# Patient Record
Sex: Male | Born: 1950
Health system: Southern US, Community
[De-identification: ages and names within clinical notes are randomized; demographics above are authoritative.]

## PROBLEM LIST (undated history)

## (undated) DIAGNOSIS — C3491 Malignant neoplasm of unspecified part of right bronchus or lung: Secondary | ICD-10-CM

## (undated) DIAGNOSIS — Z923 Personal history of irradiation: Secondary | ICD-10-CM

## (undated) DIAGNOSIS — I10 Essential (primary) hypertension: Secondary | ICD-10-CM

## (undated) DIAGNOSIS — Z7189 Other specified counseling: Secondary | ICD-10-CM

## (undated) DIAGNOSIS — J9690 Respiratory failure, unspecified, unspecified whether with hypoxia or hypercapnia: Secondary | ICD-10-CM

## (undated) HISTORY — PX: OTHER SURGICAL HISTORY: SHX169

## (undated) HISTORY — DX: Malignant neoplasm of unspecified part of right bronchus or lung: C34.91

## (undated) HISTORY — DX: Other specified counseling: Z71.89

## (undated) HISTORY — PX: EYE SURGERY: SHX253

---

## 1998-07-20 ENCOUNTER — Emergency Department (HOSPITAL_COMMUNITY): Admission: EM | Admit: 1998-07-20 | Discharge: 1998-07-20 | Payer: Self-pay | Admitting: Emergency Medicine

## 1998-07-20 ENCOUNTER — Encounter: Payer: Self-pay | Admitting: Emergency Medicine

## 1998-08-07 ENCOUNTER — Emergency Department (HOSPITAL_COMMUNITY): Admission: EM | Admit: 1998-08-07 | Discharge: 1998-08-07 | Payer: Self-pay | Admitting: Emergency Medicine

## 2000-12-14 ENCOUNTER — Ambulatory Visit (HOSPITAL_COMMUNITY): Admission: RE | Admit: 2000-12-14 | Discharge: 2000-12-14 | Payer: Self-pay | Admitting: Orthopedic Surgery

## 2000-12-14 ENCOUNTER — Encounter: Payer: Self-pay | Admitting: Orthopedic Surgery

## 2001-07-18 ENCOUNTER — Encounter: Payer: Self-pay | Admitting: Orthopedic Surgery

## 2001-07-23 ENCOUNTER — Inpatient Hospital Stay (HOSPITAL_COMMUNITY): Admission: RE | Admit: 2001-07-23 | Discharge: 2001-07-26 | Payer: Self-pay | Admitting: Orthopedic Surgery

## 2001-07-23 ENCOUNTER — Encounter: Payer: Self-pay | Admitting: Orthopedic Surgery

## 2005-06-02 ENCOUNTER — Emergency Department (HOSPITAL_COMMUNITY): Admission: EM | Admit: 2005-06-02 | Discharge: 2005-06-02 | Payer: Self-pay | Admitting: Emergency Medicine

## 2007-08-10 ENCOUNTER — Emergency Department (HOSPITAL_COMMUNITY): Admission: EM | Admit: 2007-08-10 | Discharge: 2007-08-10 | Payer: Self-pay | Admitting: Emergency Medicine

## 2010-06-07 ENCOUNTER — Emergency Department (HOSPITAL_COMMUNITY)
Admission: EM | Admit: 2010-06-07 | Discharge: 2010-06-07 | Payer: Self-pay | Source: Home / Self Care | Admitting: Emergency Medicine

## 2010-06-11 ENCOUNTER — Emergency Department (HOSPITAL_COMMUNITY)
Admission: EM | Admit: 2010-06-11 | Discharge: 2010-06-11 | Payer: Self-pay | Source: Home / Self Care | Admitting: Emergency Medicine

## 2010-10-08 NOTE — H&P (Signed)
[facility]  Patient:    Bradley Maldonado, Bradley Maldonado Visit Number: [id number] MRN: [id number]          Service Type: Attending:  Ollen Gross, M.D. Dictated by:   Sammuel Cooper. Mahar, P.A. Adm. Date:  07/23/01                           History and Physical  DATE OF BIRTH:  1950/07/11  CHIEF COMPLAINT:  Right hip pain.  HISTORY OF PRESENT ILLNESS:  The patient is a 60 year old male with right hip pain times approximately one year.  It has gotten progressively worse over the last few months, and he has documented avascular necrosis to the right hip. It has gotten to the point that it is interfering with his activities of daily living and quality of life.  Risks and benefits of the above surgery were discussed with the patient by Dr. Ollen Gross, indicated understanding, and wished to proceed.  ALLERGIES:  No known drug allergies.  CURRENT MEDICATIONS:  Voltaren.  PAST MEDICAL HISTORY:  Healthy.  PAST SURGICAL HISTORY:  Right eye foreign body removal.  SOCIAL HISTORY:  Smokes 1/2-pack of cigarettes per day.  He drinks approximately six beers per day.  [name] divorced.  [name] currently living with his girlfriend.  He has three grown children.  His girlfriend will be helping him postoperatively, and she will be available 24-hours a day throughout the postoperative course.  FAMILY HISTORY:  Mother deceased at age 69 with a history of diabetes mellitus, hypertension, and coronary artery disease.  Father alive at age 9 with emphysema.  REVIEW OF SYSTEMS:  The patient denies any fevers, chills, night sweats, or bleeding.  CNS:  Denies blurry vision, double vision, headaches, seizure, or paralysis.  Cardiovascular:  Denied chest pain, angina, orthopnea, claudication, or palpitations.  Pulmonary:  Denies any shortness of breath, productive cough, or hemoptysis.  GI:  Denies nausea, vomiting, constipation, diarrhea, melena, or bloody stools.  GU:  Denies  dysuria, hematuria, or discharge.  Musculoskeletal:  Denies any paresthesias, numbness, or paralysis to extremities.  PHYSICAL EXAMINATION:  VITAL SIGNS:  Blood pressure was 134/88, respirations 16 and unlabored, pulses 80 and regular.  GENERAL:  The patient is a 60 year old black male who is alert and oriented in no acute distress.  Well-nourished and well-groomed.  Appears stated age and is very pleasant and cooperative to exam.  HEENT:  Head is normocephalic and atraumatic.  Pupils equal, round and reactive to light.  Extraocular movements are intact.  Nares patent bilaterally.  Oropharynx is clear without any erythema or exudate.  NECK:  Soft to palpation.  No bruits appreciated.  No lymphadenopathy or thyromegaly noted.  CHEST:  Clear to auscultation bilaterally.  No rales, rhonchi, stridor, wheeze.  Breasts not pertinent and not performed.  CARDIOVASCULAR:  S1 and S2.  Regular rate and rhythm.  No murmurs, gallops, or rubs noted.  ABDOMEN:  Positive bowel sounds throughout, nontender, and nondistended.  No organomegaly noted.  Soft to palpation.  GENITOURINARY:  Not pertinent and not performed.  EXTREMITIES:  Limited range of motion to the right hip secondary to pain.  He walks with an antalgic gait.  Sensation is grossly intact to light touch throughout bilateral lower extremities.  Posterior tibialis pulses are intact and equal bilaterally.  Distal motor function is intact.  Right hip flexion to approximately 90 degrees, rotation internally to approximately 20 degrees, externally 30 degrees, abduction approximately  30 degrees.  SKIN:  Intact without any lesions or rashes.  DIAGNOSTIC STUDIES:  X-rays show progressive collapse of femoral head on the films from May 11, 2001 as compared to films from two months prior.  IMPRESSION:  Right hip avascular necrosis.  PLAN: 1. Admit to [facility] on July 23, 2001 for a right total hip    arthroplasty which  will be done by Ollen Gross, M.D. 2. The patient has no primary care practitioner.  He does visit [facility] on    [address]. Dictated by:   Sammuel Cooper. Mahar, P.A. Attending:  Ollen Gross, M.D. DD:  07/18/01 TD:  07/18/01 Job: [id number]

## 2010-10-08 NOTE — Discharge Summary (Signed)
[facility]  Patient:    Bradley Maldonado, Bradley Maldonado Visit Number: [id number] MRN: [id number]          Service Type: SUR Location: 4W 0477 01 Attending Physician:  Loanne Drilling Dictated by:   Alexzandrew L. Perkins, P.A.-C. Admit Date:  07/23/2001 Discharge Date: 07/26/2001                             Discharge Summary  5ADMITTING DIAGNOSES: 1. Right hip avascular necrosis. 2. Smoking history. 3. Alcohol use.  DISCHARGE DIAGNOSES: 1. Avascular necrosis, right hip, status post right total hip replacement    arthroplasty. 2. Mild postoperative hemorrhagic anemia, asymptomatic. 3. Postoperative hyponatremia, improved. 4. Postoperative hypokalemia, improved. 5. Smoking history. 6. Alcohol use. 7. Mildly elevated liver enzymes.  PROCEDURE:  The patient was taken to the OR on July 23, 2001, and underwent a right total hip replacement arthroplasty.  Surgeon, Ollen Gross, M.D., assistant Ottie Glazier. Wynona Neat, P.A.-C.  The surgery was done under general anesthesia.  A Hemovac drain x1 placed at the time of surgery.  CONSULTATIONS:  None.  HISTORY OF PRESENT ILLNESS:  The patient is a 60 year old male with right hip pain for approximately one year now.  The pain has been progressively getting worse over the last few months.  He has been documented with avascular necrosis of the right hip.  Pain has started to interfere with his daily activities and quality of life.  He was seen and evaluated by Dr. Ollen Gross.  Risks and benefits of the procedure for hip replacement have been discussed with the patient, and he has elected to proceed with surgery.  LABORATORY DATA:  CBC on admission had a hemoglobin of 15.0, hematocrit 48.1, white cell count 7.6, red cell count 4.95.  Differential within normal limits. Follow-up H&H after surgery was 3.1 and 38.2, last noted H&H as 12.0 and 30.0. PT, PTT on admission were 12.5 and 27, respectively, with an INR of  0.9. Serial protimes followed per Coumadin protocol, last noted PT, INR 17.8 and 1.7.  Chemistry panel on admission:  Elevated AST and elevated ALT of 45 and 43, respectively.  Remaining chemistry panel all within normal limits. Follow-up BMET showed a drop in sodium from 136 to 131.  It was back up prior to discharge to 134.  The patient did have a drop in potassium from 4.2 to 3.4 and was given potassium supplements.  Glucose elevated from 100 up to 139, was back down to 127.  Urinalysis on admission was negative.  Blood group type is A positive.  The last noted sodium and potassium levels:  Sodium was back up to 133.  The last potassium level had come up from 3.4 to 3.6 prior to his discharge.  EKG dated July 18, 2001:  Normal sinus rhythm, normal EKG, no old tracings to compare with, confirmed by Luis Abed, M.D.  Chest x-ray dated July 18, 2001:  Normal chest.  Hip films July 18, 2001, showed avascular necrosis of the right femoral head with secondary severe degenerative changes of the hip.  Postoperative hip films on July 23, 2001: Total hip replacement on the right felt to be in good position and alignment.  HOSPITAL COURSE:  The patient was admitted to [facility], taken to the OR, and underwent the above-stated procedure without complications. The patient tolerated the procedure well, was later transferred to the recovery room and then to the orthopedic floor for  continued postoperative care.  Vital signs were followed and may be found in the graphics section of this chart. The patient had had some postoperative temperature after surgery.  The temperature did increase up to 102.4, noted on postop day 2.  Temperature did come back down to 98.2.  It was again elevated up to 101.1 the evening of postop day 2 but had come back down to 100.9 prior to his release.  He did have a cyclic-type temperature which did respond to antipyretics and also incentive  spirometer.  The patient was placed on PCA analgesia for pain control following surgery.  We also placed him on Coumadin protocol for DVT prophylaxis.  He was given Librium due to his alcohol use.  The Hemovac drain which was placed at the time of surgery was pulled on postop day 1. The patient was noted to have a drop in sodium.  Fluids were adjusted, and he was encouraged mobility by physical therapy.  On postop day 2 is when he had the highest elevated temperature and again did respond to antipyretics and incentive spirometer.  He was noted again to have some mild hyponatremia and also some hypokalemia.  He was placed on potassium supplements.  His hyponatremia improved along with his hypokalemia.  He did progress with physical therapy, which was ordered postoperatively.  He was ambulating greater than 80 feet by postop day 2 and was getting around quite well.  By postop day 3 he was seen on rounds, continued to have some cyclic temperature; however, he had been up ambulating and was responding and his temperature was back down to 100.9.  He was feeling well.  His sodium had improved, his potassium had improved, and it was decided the patient would be discharged home.  DISCHARGE PLAN:  The patient was discharged home on July 26, 2001.  DISCHARGE DIAGNOSES:  Please see above.  DISCHARGE MEDICATIONS:  He was given Colace, Coumadin, Percocet for pain, and also Robaxin.  DIET:  As tolerated.  FOLLOW-UP:  Two weeks from surgery.  Call the office for an appointment at [phone].  ACTIVITY:  Hip precautions at all times.  Touchdown weightbearing to the right lower extremity.   Home health PT and home health nursing.  DISPOSITION:  Home.  CONDITION ON DISCHARGE:  Improved. Dictated by:   Alexzandrew L. Perkins, P.A.-C. Attending Physician:  Loanne Drilling DD:  08/15/01 TD:  08/16/01 Job: [id number]

## 2010-10-08 NOTE — Op Note (Signed)
[facility]  Patient:    Bradley Maldonado, Bradley Maldonado Visit Number: [id number] MRN: [id number]          Service Type: SUR Location: 1S X008 01 Attending Physician:  Loanne Drilling Dictated by:   Ollen Gross, M.D. Proc. Date: 07/23/01 Admit Date:  07/23/2001                             Operative Report  PREOPERATIVE DIAGNOSIS:  Avascular necrosis, right hip.  POSTOPERATIVE DIAGNOSIS:  Avascular necrosis, right hip.  PROCEDURE:  Right total hip arthroplasty.  SURGEON:  Ollen Gross, M.D.  ASSISTANT:  Ottie Glazier. Wynona Neat, P.A.-C.  ANESTHESIA:  General.  ESTIMATED BLOOD LOSS:  600.  DRAIN:  Hemovac x 1.  COMPLICATIONS:  None.  CONDITION:  Stable to recovery.  BRIEF CLINICAL NOTE:  Bradley Maldonado is a 60 year old male with stage 4 avascular necrosis of his right hip with pain refractory to nonoperative management.  He presents now for right total hip arthroplasty.  PROCEDURE IN DETAIL:  After the successful administered of general anesthetic, the patient is placed in the left lateral decubitus position with the right side up and held with the hip positioner.  The right lower extremity was isolated from its perineum with plastic drapes and prepped and draped in the usual sterile fashion.  A standard posterolateral incision was made with 10 blade through subcutaneous tissues to the level of the fascia lata which was incised in line with the skin incision.  The sciatic nerve was palpated and protected and the short external rotators isolated off the femur.  A capsulectomy was performed.  Hip was dislocated.  He had severe collapse of his femoral head with cartilage peeling off the bone.  The center of the head was marked, and trial prosthesis is placed such that the center of the trial head corresponds with the center of his native femoral head.  Osteotomy is made with an oscillating saw.  The femoral head is removed and then the femur retracted anteriorly.   Anterior acetabular exposure is obtained.  Acetabular reaming is initiated with a size 4y, coursing increments of 2 to a 53, and then a 54 mm Pinnacle acetabular shell is placed into anatomic position.  It has a great fit and is transfixed with two domed screws.  Trial 32 mm neutral liner is placed.  The femur is prepared first with the canal finder and then irrigation.  Axial reaming is performed to 13.5 mm.  Proximal reaming is performed up to an 18-F and the sleeve machine to a large.  An 18-F large sleeve is placed with an 18 x 13 stem and a 36 plus 8 neck with a 32 plus 0 head.  Trial is performed matching native anteversion.  Reduction shows excellent stability with full extension, external rotation, 70 degrees flexion, 40 degrees adduction, 90 degrees internal rotation and 90 degrees of flexion, 70 degrees internal rotation.  Trials were then removed and the apex hole eliminator placed into the acetabular shell.  Permanent 32 mm neutral Marathon liner is placed into the shell.  The 18-F large sleeve is placed in the proximal femur, then the 18 x 13 stem with 36 plus 8 neck is placed, matching his native anteversion. The 32 plus 0 head is then placed, hip reduced with great stability.  The wound is copiously irrigated with antibiotic solution.  The short external rotators are reattached to the femur through drill holes.  The  fascia lata is closed over a Hemovac drain with interrupted #1 Vicryl.  Subcu closed with #1 and 2-0 Vicryl, subcuticular running 4-0 Monocryl.  Incision is cleaned and dried and Steri-Strips and a bulky sterile dressing applied.  The patient is then awakened and transported to recovery in stable condition. Dictated by:   Ollen Gross, M.D. Attending Physician:  Loanne Drilling DD:  07/23/01 TD:  07/23/01 Job: [id number]

## 2010-11-25 ENCOUNTER — Emergency Department (HOSPITAL_COMMUNITY)
Admission: EM | Admit: 2010-11-25 | Discharge: 2010-11-26 | Disposition: A | Discharge status: Home / Self Care | Payer: Medicare Other | Attending: Emergency Medicine | Admitting: Emergency Medicine

## 2010-11-25 ENCOUNTER — Emergency Department (HOSPITAL_COMMUNITY): Payer: Medicare Other

## 2010-11-25 DIAGNOSIS — M7989 Other specified soft tissue disorders: Secondary | ICD-10-CM | POA: Insufficient documentation

## 2010-11-25 DIAGNOSIS — L02619 Cutaneous abscess of unspecified foot: Secondary | ICD-10-CM | POA: Insufficient documentation

## 2010-11-25 DIAGNOSIS — L539 Erythematous condition, unspecified: Secondary | ICD-10-CM | POA: Insufficient documentation

## 2010-11-25 LAB — BASIC METABOLIC PANEL
Chloride: 104 mEq/L (ref 96–112)
Creatinine, Ser: 0.72 mg/dL (ref 0.50–1.35)
GFR calc Af Amer: >60 mL/min (ref >60)
Potassium: 4.3 mEq/L (ref 3.5–5.1)
Sodium: 140 mEq/L (ref 135–145)

## 2010-11-25 LAB — DIFFERENTIAL
Basophils Absolute: 0 10*3/uL (ref 0.0–0.1)
Eosinophils Absolute: 0.1 10*3/uL (ref 0.0–0.7)
Eosinophils Relative: 2 % (ref 0–5)
Neutrophils Relative %: 45 % (ref 43–77)

## 2010-11-25 LAB — CBC
Platelets: 229 10*3/uL (ref 150–400)
RBC: 4.79 MIL/uL (ref 4.22–5.81)
RDW: 14.3 % (ref 11.5–15.5)
WBC: 7.2 10*3/uL (ref 4.0–10.5)

## 2016-12-17 ENCOUNTER — Emergency Department (HOSPITAL_COMMUNITY): Payer: Medicare Other

## 2016-12-17 ENCOUNTER — Inpatient Hospital Stay (HOSPITAL_COMMUNITY): Payer: Medicare Other

## 2016-12-17 ENCOUNTER — Encounter (HOSPITAL_COMMUNITY): Payer: Self-pay

## 2016-12-17 ENCOUNTER — Inpatient Hospital Stay (HOSPITAL_COMMUNITY)
Admission: EM | Admit: 2016-12-17 | Discharge: 2016-12-21 | DRG: 180 | Disposition: A | Discharge status: Home / Self Care | Payer: Medicare Other | Attending: Internal Medicine | Admitting: Internal Medicine

## 2016-12-17 DIAGNOSIS — C3491 Malignant neoplasm of unspecified part of right bronchus or lung: Secondary | ICD-10-CM | POA: Diagnosis not present

## 2016-12-17 DIAGNOSIS — I10 Essential (primary) hypertension: Secondary | ICD-10-CM | POA: Diagnosis not present

## 2016-12-17 DIAGNOSIS — J910 Malignant pleural effusion: Secondary | ICD-10-CM | POA: Diagnosis not present

## 2016-12-17 DIAGNOSIS — J440 Chronic obstructive pulmonary disease with acute lower respiratory infection: Secondary | ICD-10-CM | POA: Diagnosis not present

## 2016-12-17 DIAGNOSIS — C3411 Malignant neoplasm of upper lobe, right bronchus or lung: Secondary | ICD-10-CM | POA: Diagnosis not present

## 2016-12-17 DIAGNOSIS — F419 Anxiety disorder, unspecified: Secondary | ICD-10-CM | POA: Diagnosis present

## 2016-12-17 DIAGNOSIS — R451 Restlessness and agitation: Secondary | ICD-10-CM | POA: Diagnosis not present

## 2016-12-17 DIAGNOSIS — J449 Chronic obstructive pulmonary disease, unspecified: Secondary | ICD-10-CM

## 2016-12-17 DIAGNOSIS — R918 Other nonspecific abnormal finding of lung field: Secondary | ICD-10-CM | POA: Diagnosis not present

## 2016-12-17 DIAGNOSIS — F1010 Alcohol abuse, uncomplicated: Secondary | ICD-10-CM | POA: Diagnosis present

## 2016-12-17 DIAGNOSIS — C384 Malignant neoplasm of pleura: Secondary | ICD-10-CM | POA: Diagnosis not present

## 2016-12-17 DIAGNOSIS — J439 Emphysema, unspecified: Secondary | ICD-10-CM | POA: Diagnosis present

## 2016-12-17 DIAGNOSIS — Z9889 Other specified postprocedural states: Secondary | ICD-10-CM

## 2016-12-17 DIAGNOSIS — J9811 Atelectasis: Secondary | ICD-10-CM | POA: Diagnosis not present

## 2016-12-17 DIAGNOSIS — H02402 Unspecified ptosis of left eyelid: Secondary | ICD-10-CM | POA: Diagnosis present

## 2016-12-17 DIAGNOSIS — F17210 Nicotine dependence, cigarettes, uncomplicated: Secondary | ICD-10-CM | POA: Diagnosis present

## 2016-12-17 DIAGNOSIS — R739 Hyperglycemia, unspecified: Secondary | ICD-10-CM | POA: Diagnosis not present

## 2016-12-17 DIAGNOSIS — J189 Pneumonia, unspecified organism: Secondary | ICD-10-CM | POA: Diagnosis present

## 2016-12-17 DIAGNOSIS — R911 Solitary pulmonary nodule: Secondary | ICD-10-CM | POA: Diagnosis not present

## 2016-12-17 DIAGNOSIS — R0602 Shortness of breath: Secondary | ICD-10-CM | POA: Diagnosis not present

## 2016-12-17 DIAGNOSIS — Z9981 Dependence on supplemental oxygen: Secondary | ICD-10-CM | POA: Diagnosis not present

## 2016-12-17 DIAGNOSIS — J90 Pleural effusion, not elsewhere classified: Secondary | ICD-10-CM

## 2016-12-17 DIAGNOSIS — C3490 Malignant neoplasm of unspecified part of unspecified bronchus or lung: Principal | ICD-10-CM | POA: Diagnosis present

## 2016-12-17 DIAGNOSIS — Z96641 Presence of right artificial hip joint: Secondary | ICD-10-CM | POA: Diagnosis not present

## 2016-12-17 DIAGNOSIS — R29818 Other symptoms and signs involving the nervous system: Secondary | ICD-10-CM

## 2016-12-17 DIAGNOSIS — Z833 Family history of diabetes mellitus: Secondary | ICD-10-CM

## 2016-12-17 DIAGNOSIS — Z801 Family history of malignant neoplasm of trachea, bronchus and lung: Secondary | ICD-10-CM | POA: Diagnosis not present

## 2016-12-17 DIAGNOSIS — J9601 Acute respiratory failure with hypoxia: Secondary | ICD-10-CM | POA: Diagnosis not present

## 2016-12-17 DIAGNOSIS — F17200 Nicotine dependence, unspecified, uncomplicated: Secondary | ICD-10-CM | POA: Diagnosis not present

## 2016-12-17 DIAGNOSIS — R05 Cough: Secondary | ICD-10-CM | POA: Diagnosis not present

## 2016-12-17 LAB — CBC WITH DIFFERENTIAL/PLATELET
BASOS PCT: 0 %
Basophils Absolute: 0 10*3/uL (ref 0.0–0.1)
EOS ABS: 0.1 10*3/uL (ref 0.0–0.7)
Eosinophils Relative: 1 %
HCT: 49.8 % (ref 39.0–52.0)
Hemoglobin: 16.2 g/dL (ref 13.0–17.0)
Lymphocytes Relative: 29 %
Lymphs Abs: 2.4 10*3/uL (ref 0.7–4.0)
MCH: 29.6 pg (ref 26.0–34.0)
MCHC: 32.5 g/dL (ref 30.0–36.0)
MCV: 90.9 fL (ref 78.0–100.0)
MONO ABS: 1 10*3/uL (ref 0.1–1.0)
MONOS PCT: 12 %
Neutro Abs: 4.6 10*3/uL (ref 1.7–7.7)
Neutrophils Relative %: 58 %
Platelets: 185 10*3/uL (ref 150–400)
RBC: 5.48 MIL/uL (ref 4.22–5.81)
RDW: 14.8 % (ref 11.5–15.5)
WBC: 8 10*3/uL (ref 4.0–10.5)

## 2016-12-17 LAB — BASIC METABOLIC PANEL
ANION GAP: 11 (ref 5–15)
BUN: 6 mg/dL (ref 6–20)
CHLORIDE: 103 mmol/L (ref 101–111)
CO2: 23 mmol/L (ref 22–32)
Calcium: 8.8 mg/dL — ABNORMAL LOW (ref 8.9–10.3)
Creatinine, Ser: 0.83 mg/dL (ref 0.61–1.24)
GFR calc Af Amer: >60 mL/min (ref >60)
GFR calc non Af Amer: >60 mL/min (ref >60)
Glucose, Bld: 101 mg/dL — ABNORMAL HIGH (ref 65–99)
Potassium: 4.3 mmol/L (ref 3.5–5.1)
Sodium: 137 mmol/L (ref 135–145)

## 2016-12-17 LAB — LACTATE DEHYDROGENASE, PLEURAL OR PERITONEAL FLUID: LD FL: 194 U/L — AB (ref 3–23)

## 2016-12-17 LAB — I-STAT CHEM 8, ED
BUN: 6 mg/dL (ref 6–20)
Calcium, Ion: 1.03 mmol/L — ABNORMAL LOW (ref 1.15–1.40)
Chloride: 104 mmol/L (ref 101–111)
Creatinine, Ser: 0.7 mg/dL (ref 0.61–1.24)
Glucose, Bld: 116 mg/dL — ABNORMAL HIGH (ref 65–99)
HEMATOCRIT: 51 % (ref 39.0–52.0)
HEMOGLOBIN: 17.3 g/dL — AB (ref 13.0–17.0)
POTASSIUM: 4 mmol/L (ref 3.5–5.1)
SODIUM: 139 mmol/L (ref 135–145)
TCO2: 22 mmol/L (ref 0–100)

## 2016-12-17 LAB — LACTATE DEHYDROGENASE: LDH: 155 U/L (ref 98–192)

## 2016-12-17 LAB — BODY FLUID CELL COUNT WITH DIFFERENTIAL
Eos, Fluid: 1 %
Lymphs, Fluid: 91 %
Monocyte-Macrophage-Serous Fluid: 4 % — ABNORMAL LOW (ref 50–90)
Neutrophil Count, Fluid: 4 % (ref 0–25)
Total Nucleated Cell Count, Fluid: 842 uL (ref 0–1000)

## 2016-12-17 LAB — GLUCOSE, PLEURAL OR PERITONEAL FLUID: Glucose, Fluid: 137 mg/dL

## 2016-12-17 LAB — GRAM STAIN

## 2016-12-17 LAB — PROTEIN, PLEURAL OR PERITONEAL FLUID: Total protein, fluid: 4.6 g/dL

## 2016-12-17 LAB — I-STAT TROPONIN, ED: Troponin i, poc: 0.04 ng/mL (ref 0.00–0.08)

## 2016-12-17 LAB — ALBUMIN, PLEURAL OR PERITONEAL FLUID: Albumin, Fluid: 2.8 g/dL

## 2016-12-17 LAB — BRAIN NATRIURETIC PEPTIDE: B NATRIURETIC PEPTIDE 5: 93.5 pg/mL (ref 0.0–100.0)

## 2016-12-17 MED ORDER — ADULT MULTIVITAMIN W/MINERALS CH
1.0000 | ORAL_TABLET | Freq: Every day | ORAL | Status: DC
  Administered 2016-12-17 – 2016-12-21 (×5): 1 via ORAL
  Filled 2016-12-17 (×5): qty 1

## 2016-12-17 MED ORDER — AMLODIPINE BESYLATE 5 MG PO TABS
5.0000 mg | ORAL_TABLET | Freq: Every day | ORAL | Status: DC
  Administered 2016-12-17 – 2016-12-21 (×5): 5 mg via ORAL
  Filled 2016-12-17 (×5): qty 1

## 2016-12-17 MED ORDER — NICOTINE 21 MG/24HR TD PT24
21.0000 mg | MEDICATED_PATCH | Freq: Every day | TRANSDERMAL | Status: DC
  Administered 2016-12-17 – 2016-12-21 (×2): 21 mg via TRANSDERMAL
  Filled 2016-12-17 (×4): qty 1

## 2016-12-17 MED ORDER — IPRATROPIUM-ALBUTEROL 0.5-2.5 (3) MG/3ML IN SOLN
3.0000 mL | Freq: Once | RESPIRATORY_TRACT | Status: AC
  Administered 2016-12-17: 3 mL via RESPIRATORY_TRACT
  Filled 2016-12-17: qty 3

## 2016-12-17 MED ORDER — LORAZEPAM 2 MG/ML IJ SOLN: 1.0000 mg | Freq: Four times a day (QID) | INTRAMUSCULAR | Status: DC | PRN

## 2016-12-17 MED ORDER — VITAMIN B-1 100 MG PO TABS
100.0000 mg | ORAL_TABLET | Freq: Every day | ORAL | Status: DC
  Administered 2016-12-17 – 2016-12-21 (×5): 100 mg via ORAL
  Filled 2016-12-17 (×5): qty 1

## 2016-12-17 MED ORDER — LORAZEPAM 1 MG PO TABS: 1.0000 mg | ORAL_TABLET | Freq: Four times a day (QID) | ORAL | Status: DC | PRN

## 2016-12-17 MED ORDER — LIDOCAINE HCL (PF) 1 % IJ SOLN
INTRAMUSCULAR | Status: AC
  Filled 2016-12-17: qty 10

## 2016-12-17 MED ORDER — FOLIC ACID 1 MG PO TABS
1.0000 mg | ORAL_TABLET | Freq: Every day | ORAL | Status: DC
  Administered 2016-12-17 – 2016-12-21 (×5): 1 mg via ORAL
  Filled 2016-12-17 (×5): qty 1

## 2016-12-17 MED ORDER — THIAMINE HCL 100 MG/ML IJ SOLN: 100.0000 mg | Freq: Every day | INTRAMUSCULAR | Status: DC

## 2016-12-17 MED ORDER — FUROSEMIDE 10 MG/ML IJ SOLN
40.0000 mg | Freq: Once | INTRAMUSCULAR | Status: AC
  Administered 2016-12-17: 40 mg via INTRAVENOUS
  Filled 2016-12-17: qty 4

## 2016-12-17 MED ORDER — OMEGA-3-ACID ETHYL ESTERS 1 G PO CAPS
1.0000 g | ORAL_CAPSULE | Freq: Two times a day (BID) | ORAL | Status: DC
  Administered 2016-12-17 – 2016-12-21 (×8): 1 g via ORAL
  Filled 2016-12-17 (×9): qty 1

## 2016-12-17 NOTE — Procedures (Signed)
  Procedure:   R thoracentesis  Under [place] Preprocedure diagnosis:  effusion Postprocedure diagnosis:  same EBL:     minimal Complications:   none immediate  See full dictation in [facility].  Dillard Cannon MD Main # [phone] Pager  [phone]

## 2016-12-17 NOTE — ED Notes (Signed)
Patient transported to Ultrasound 

## 2016-12-17 NOTE — ED Notes (Signed)
Pt transported to thoracentesis.

## 2016-12-17 NOTE — H&P (Signed)
Date: 12/17/2016               Patient Name:  Bradley Maldonado MRN: [id number]  DOB: 1951-02-02 Age / Sex: 66 y.o., male   PCP: Patient, No Pcp Per         Medical Service: Internal Medicine Teaching Service         Attending Physician: Dr. Richardean Canal,     First Contact: Dr. Berline Lopes Pager: [phone]  Second Contact: Dr. Marlowe Sax Pager: [phone]       After Hours (After 5p/  First Contact Pager: [phone]  weekends / holidays): Second Contact Pager: [phone]   Chief Complaint: "I couldn't breath"  History of Present Illness: Mr. Vanzanten is a pleasant 66 year old male who presented with worsening shortness of breath over the past 9 days. He has a past medical history significant only for chronic bronchitis diagnosis in 2000 at [facility] ED, but adds that he never followed up with a primary care doctor as his symptoms improved. The SOB became noticeably worse over the past 1-2 days to the extent that he experienced severe anxiety over lying down. Upon finally falling asleep, he would awaken in a panic being severely SOB. It has worsened to the extent that he is anxious about lying in a bed preferring to sit on the side of the bed with his feet dangling. Due to his rather sedentary life style, he was not clear as to whether or not the SOB may be been progressively worsening over the past month. Patient attested to having difficulty breathing a few times in the past, but not continuously nor to this extent. He stated that he spends most of his day, sitting around the house playing checkers and cards with his buddies while smoking tobacco, drinking EtOH, accompanied by recreational, and he emphasized occasional marijuana and cocaine use. The patient was accompanied by a close male friend and neighbor who attested to his story adding only that she had attempted to persuade him to se a doctor for his breathing since he began to struggle.  Patient stated that he had finally coughed up a small amount of  white sputum but denied any such occurence prior. He experienced occasional mild subjective fevers, mild night sweats, right sided chest/rib/pleural pain with coughing, mid thoracic burning pain secondary to prolonged violent coughing, and possible increased urinary frequency. He denied vomiting, nausea, diarrhea, constipation, hematemesis, hematochezia, hematuria, dysuria, muscle aches, headaches, visual changes etc.   The ED performed a CXR that demonstrated a large unilateral right sided pleural effusion. In addition, CBC, ECG and BMP were rather unremarkable with only elevated blood glucose 149, BNP 93.5, Troponin 0.04. Call was placed to [facility]MT[facility] to admit patient.   Meds:  Current Meds  Medication Sig  . Omega-3 Fatty Acids (FISH OIL) 1000 MG CAPS Take 1,000 mg by mouth 2 (two) times a week.   Allergies: Allergies as of 12/17/2016  . (No Known Allergies)   History reviewed. No pertinent past medical history.  Surgical History: Right hip replacement   Family History:  Mother DMII, gynecological tumor-deceased Father, Smoker, lung cancer now deceased secondary to lung cancer  Social History:  Patient lives at home alone Smoked 0.5 ppd for 35 years, then 1.5 ppd for the past 8-9 years EtOH, states that he is down to usually no more than a 6 pack per day Drugs, Cocaine and marijuana recreationally   Review of Systems: A complete ROS was negative except as per HPI.   Physical  Exam: Blood pressure (!) 146/107, pulse 96, temperature 98.7 F (37.1 C), resp. rate (!) 25, height 5\' 11"  (1.803 m), weight 156 lb (70.8 kg), SpO2 93 %.  Physical Exam  Constitutional: [name] oriented to person, place, and time. He appears well-developed. No distress.  HENT:  Head: Normocephalic and atraumatic.  Eyes: Pupils are equal, round, and reactive to light. EOM are normal. No scleral icterus.  Neck: Normal range of motion. Neck supple. No tracheal deviation present.  Cardiovascular: Normal rate,  regular rhythm and normal heart sounds.   No murmur heard. Pulmonary/Chest: Effort normal. No stridor. No respiratory distress. He has decreased breath sounds in the right lower field. He has no wheezes.  Abdominal: Soft. Bowel sounds are normal. He exhibits no distension. There is no tenderness.  Musculoskeletal: He exhibits no edema, tenderness or deformity.  Neurological: [name] alert and oriented to person, place, and time.  Skin: Skin is warm and dry. Capillary refill takes less than 2 seconds. [name] not diaphoretic.  Psychiatric: He has a normal mood and affect. His behavior is normal. Judgment and thought content normal.   EKG: personally reviewed my interpretation is, probable LVH, single PVC captured, normal sinus rhythm,  CXR: personally reviewed my interpretation is, large right sided pleural effusion, no effusion noted on left  Assessment & Plan by Problem: Active Problems:   Pleural effusion   Hypertension  Mr. Souders was admitted for pleural effusion secondary to a currently unknown etiology. Given his history of tobacco use and family history of lung cancer in a first degree relative, its unilateral distrubution, BNP <95, denial of hemoptysis, lack of contributing history, lung cancer can not be excluded and is considered at the top of the differential as the underlying etiology of the pleural effusion. Lung cancer vs tuberculosis vs atypical pneumonia vs COPD exacerbation with underlying cancer/infection. He was admitted for evaluation and treatment of the pleural effusion.  Pleural effusion: Large right sided pleural effusion on CXR -IR consulted for diagnostic and therapeutic thoracentesis withdrawing 1.3 liters of fluid  -Fluid LDH, cytology, pH, glucose, protein, albumin, gram stain, triglycerides, cholesterol, acid fast smear, pending -Serum LDH, protein -CT following thoracentesis -BMP, CBC, BNP noncontributory   Hypertension: Patient not formerly on  antihypertensives, will consider CCB/thiazide if no improvement  -Patient given 40mg  IV furosemide in ED -will continue monitor,  -amlodipine 5mg  for HTN added  EtOH abuse: -CIWA with ativan ordered per protocol  -please monitor and advise as necessary   DVT ppx: SCD's Diet: Regular CODE: FULL Dispo: Admit patient to Inpatient with expected length of stay greater than 2 midnights.  Signed: Kathi Ludwig, MD 12/17/2016, 5:01 PM  Pager: Pager# [phone]

## 2016-12-17 NOTE — ED Provider Notes (Signed)
[facility] DEPT Provider Note   CSN: [id number] Arrival date & time: 12/17/16  1204     History   Chief Complaint Chief Complaint  Patient presents with  . Shortness of Breath    HPI Bradley Maldonado is a 66 y.o. male.  66 yo M with a cc of sob.  Going on for past week.  Past three days with worsening sob on exertion.  Denies cp.  Chronic cough, but mildly worse.  Denies change in sputum or increased production.    The history is provided by the patient.  Shortness of Breath  This is a new problem. The average episode lasts 1 week. The problem occurs continuously.The current episode started 2 days ago. The problem has been rapidly worsening. Pertinent negatives include no fever, no headaches, no chest pain, no vomiting, no abdominal pain and no rash. He has tried nothing for the symptoms. The treatment provided no relief. He has had no prior hospitalizations. He has had no prior ED visits. He has had no prior ICU admissions.    History reviewed. No pertinent past medical history.  Patient Active Problem List   Diagnosis Date Noted  . Pleural effusion 12/17/2016    Past Surgical History:  Procedure Laterality Date  . Hip replacement         Home Medications    Prior to Admission medications   Medication Sig Start Date End Date Taking? Authorizing Provider  Omega-3 Fatty Acids (FISH OIL) 1000 MG CAPS Take 1,000 mg by mouth 2 (two) times a week.   Yes [provider]    Family History No family history on file.  Social History Social History  Substance Use Topics  . Smoking status: Current Every Day Smoker    Packs/day: 1.00    Types: Cigarettes  . Smokeless tobacco: Never Used  . Alcohol use 8.4 oz/week    14 Cans of beer per week     Allergies   Patient has no known allergies.   Review of Systems Review of Systems  Constitutional: Negative for chills and fever.  HENT: Negative for congestion and facial swelling.   Eyes: Negative for  discharge and visual disturbance.  Respiratory: Positive for shortness of breath.   Cardiovascular: Negative for chest pain and palpitations.  Gastrointestinal: Negative for abdominal pain, diarrhea and vomiting.  Musculoskeletal: Negative for arthralgias and myalgias.  Skin: Negative for color change and rash.  Neurological: Negative for tremors, syncope and headaches.  Psychiatric/Behavioral: Negative for confusion and dysphoric mood.     Physical Exam Updated Vital Signs BP (!) 169/93   Pulse (!) 102   Temp 98.7 F (37.1 C)   Resp (!) 23   Ht 5\' 11"  (1.803 m)   Wt 70.8 kg (156 lb)   SpO2 92%   BMI 21.76 kg/m   Physical Exam  Constitutional: [name] oriented to person, place, and time. He appears well-developed and well-nourished.  HENT:  Head: Normocephalic and atraumatic.  JVD to mid neck  Eyes: Pupils are equal, round, and reactive to light. EOM are normal.  Neck: Normal range of motion. Neck supple. No JVD present.  Cardiovascular: Normal rate and regular rhythm.  Exam reveals no gallop and no friction rub.   No murmur heard. Pulmonary/Chest: No respiratory distress. He has no wheezes.  Diminished breath sounds in the right side of the lung. Patient has dullness to percussion up to the mid scapula.  Abdominal: He exhibits no distension and no mass. There is no  tenderness. There is no rebound and no guarding.  Musculoskeletal: Normal range of motion. He exhibits edema.  Neurological: [name] alert and oriented to person, place, and time.  Skin: No rash noted. No pallor.  Psychiatric: He has a normal mood and affect. His behavior is normal.  Nursing note and vitals reviewed.    ED Treatments / Results  Labs (all labs ordered are listed, but only abnormal results are displayed) Labs Reviewed  BASIC METABOLIC PANEL - Abnormal; Notable for the following:       Result Value   Glucose, Bld 101 (*)    Calcium 8.8 (*)    All other components within normal limits  I-STAT  CHEM 8, ED - Abnormal; Notable for the following:    Glucose, Bld 116 (*)    Calcium, Ion 1.03 (*)    Hemoglobin 17.3 (*)    All other components within normal limits  CBC WITH DIFFERENTIAL/PLATELET  BRAIN NATRIURETIC PEPTIDE  I-STAT TROPONIN, ED    EKG  EKG Interpretation  Date/Time:  Saturday December 17 2016 12:13:04 EDT Ventricular Rate:  94 PR Interval:  146 QRS Duration: 80 QT Interval:  370 QTC Calculation: 462 R Axis:   68 Text Interpretation:  Sinus rhythm with occasional Premature ventricular complexes Possible Left atrial enlargement Left ventricular hypertrophy Abnormal ECG Otherwise no significant change Confirmed by Deno Etienne [phone]) on 12/17/2016 1:06:44 PM       Radiology Dg Chest 2 View  Result Date: 12/17/2016 CLINICAL DATA:  Shortness of breath. EXAM: CHEST  2 VIEW COMPARISON:  1/11/7 FINDINGS: There is a large right pleural effusion no left pleural effusion. Mild nodular interstitial abnormality appears new from previous exam and is indeterminate. The visualized osseous structures are unremarkable. IMPRESSION: 1. There is a large right pleural effusion. Additionally there is a new nodular interstitial abnormality. Both indeterminate. Suggest further evaluation with contrast enhanced CT of the chest to assess for underlying malignancy. Electronically Signed   By: Kerby Moors M.D.   On: 12/17/2016 12:40    Procedures Procedures (including critical care time)  Medications Ordered in ED Medications  furosemide (LASIX) injection 40 mg (not administered)  ipratropium-albuterol (DUONEB) 0.5-2.5 (3) MG/3ML nebulizer solution 3 mL (3 mLs Nebulization Given 12/17/16 1352)     Initial Impression / Assessment and Plan / ED Course  I have reviewed the triage vital signs and the nursing notes.  Pertinent labs & imaging results that were available during my care of the patient were reviewed by me and considered in my medical decision making (see chart for details).       66 yo M With a chief complaint of shortness of breath. This is been going on for the past week. Acutely worsening. [name] only able to take a step or 2 before having very weak. Patient's chest x-ray has a very large right-sided pleural effusion. The radiologist somewhat concerned about a malignant process. At this point the patient is unable to tolerate lying flat. His labs are performed without any other significant concerning abnormality. The patient is hypoxic, I'll discuss with the hospitalist for admission.  The patients results and plan were reviewed and discussed.   Any x-rays performed were independently reviewed by myself.   Differential diagnosis were considered with the presenting HPI.  Medications  furosemide (LASIX) injection 40 mg (not administered)  ipratropium-albuterol (DUONEB) 0.5-2.5 (3) MG/3ML nebulizer solution 3 mL (3 mLs Nebulization Given 12/17/16 1352)    Vitals:   12/17/16 1208 12/17/16 1330  BP: (!) 146/77 (!) 169/93  Pulse: 96 (!) 102  Resp: (!) 22 (!) 23  Temp: 98.7 F (37.1 C)   SpO2: 90% 92%  Weight: 70.8 kg (156 lb)   Height: 5\' 11"  (1.803 m)     Final diagnoses:  Pleural effusion    Admission/ observation were discussed with the admitting physician, patient and/or family and they are comfortable with the plan.    Final Clinical Impressions(s) / ED Diagnoses   Final diagnoses:  Pleural effusion    New Prescriptions New Prescriptions   No medications on file     Deno Etienne, DO 12/17/16 1509

## 2016-12-17 NOTE — ED Triage Notes (Signed)
Per Pt, Pt is coming from home with complaints of SOB that has worsened in the last four days with a productive cough that has white sputum. Denies Chest Pain or medical hx. Pt had to be placed on 2L of Oxygen because his base was 88% on RA upon arrival. 95% on 2L

## 2016-12-18 ENCOUNTER — Encounter (HOSPITAL_COMMUNITY): Payer: Self-pay | Admitting: *Deleted

## 2016-12-18 ENCOUNTER — Inpatient Hospital Stay (HOSPITAL_COMMUNITY): Payer: Medicare Other

## 2016-12-18 DIAGNOSIS — I10 Essential (primary) hypertension: Secondary | ICD-10-CM

## 2016-12-18 LAB — COMPREHENSIVE METABOLIC PANEL
ALBUMIN: 3 g/dL — AB (ref 3.5–5.0)
ALK PHOS: 87 U/L (ref 38–126)
ALT: 35 U/L (ref 17–63)
ANION GAP: 8 (ref 5–15)
AST: 52 U/L — ABNORMAL HIGH (ref 15–41)
BUN: 6 mg/dL (ref 6–20)
CO2: 28 mmol/L (ref 22–32)
Calcium: 8.7 mg/dL — ABNORMAL LOW (ref 8.9–10.3)
Chloride: 103 mmol/L (ref 101–111)
Creatinine, Ser: 0.84 mg/dL (ref 0.61–1.24)
GFR calc non Af Amer: >60 mL/min (ref >60)
GLUCOSE: 156 mg/dL — AB (ref 65–99)
Potassium: 3.5 mmol/L (ref 3.5–5.1)
SODIUM: 139 mmol/L (ref 135–145)
Total Bilirubin: 1 mg/dL (ref 0.3–1.2)
Total Protein: 6.4 g/dL — ABNORMAL LOW (ref 6.5–8.1)

## 2016-12-18 LAB — CBC
HEMATOCRIT: 48.4 % (ref 39.0–52.0)
HEMOGLOBIN: 16.1 g/dL (ref 13.0–17.0)
MCH: 30.1 pg (ref 26.0–34.0)
MCHC: 33.3 g/dL (ref 30.0–36.0)
MCV: 90.5 fL (ref 78.0–100.0)
Platelets: 199 10*3/uL (ref 150–400)
RBC: 5.35 MIL/uL (ref 4.22–5.81)
RDW: 14.7 % (ref 11.5–15.5)
WBC: 9.6 10*3/uL (ref 4.0–10.5)

## 2016-12-18 LAB — ACID FAST SMEAR (AFB, MYCOBACTERIA): Acid Fast Smear: NEGATIVE

## 2016-12-18 LAB — ACID FAST SMEAR (AFB)

## 2016-12-18 MED ORDER — IOPAMIDOL (ISOVUE-300) INJECTION 61%
INTRAVENOUS | Status: AC
  Administered 2016-12-18: 75 mL
  Filled 2016-12-18: qty 75

## 2016-12-18 NOTE — Progress Notes (Signed)
Date: 12/18/2016  Patient name: Bradley Maldonado  Medical record number: [id number]  Date of birth: 13-Apr-1951   I have seen and evaluated Bradley Maldonado and discussed their care with the Residency Team. Briefly, Bradley Maldonado is a 66yo man with Bradley Maldonado of chronic bronchitis, though he has not sought medical care in a long while.  He presents with 9 days of shortness of breath which has worsened the few days prior to admission.  He rides a bike and normally can go pretty far.  He first noticed the SOB when he started to be unable to bike more than a block without having to stop for dyspnea.  Over the last few days, he has also noticed increased SOB with lying flat and severe anxiety when trying to lie flat.  He had PND as well.  [name] mostly sedentary and has only noticed the SOB with riding his bike.  [name] retired and smokes 1.5 - 2ppds currently and has been smoking for some years.  He further notes a 15 pound weight loss since February, but he also notes that he has not had good access to food in that time as well.  He denies chest pain, hemoptysis.  He has had some mild pleuritic type chest pain on the right with coughing or deep breath.  He has only produced a small amount of sputum.  He further notes daily ETOH use as well as occasional MJ and cocaine use.  He denies sweating at night, hematemesis, constipation, nausea, vomiting, diaphoresis.  He has no apparent risk factors for TB, never served in the [employer] or served time in prison.    In the ED, he was found to have a pleural effusion.  He had a thoracentesis yesterday which was noted to be exudative (lights criteria LDH ratio 1.2, protein ration 0.7).  CT scan chest is pending.   Fam Hx, and/or Soc Hx : Father with lung cancer due to smoking, Current smoking, occasional recreational drug use.   Vitals:   12/17/16 2004 12/18/16 0452  BP: 125/81 118/73  Pulse: 94 92  Resp: 20 20  Temp: 99.6 F (37.6 C) 98.9 F (37.2 C)   Physical Exam:   Gen: Awake, alert, no distress, lying comfortably in the bed Eyes: He has decreased movement of the left eye, anicteric HENT: Neck supple CV: RR, NR, no murmur Pulm: Decreased breath sounds to mid lung on the right, crackles at mid lung, clear on the left, no wheezing, O2 [place] in place, pulse ox in the low 90s Abd: Non distended, non tender, +BS Ext: Thin, no edema Skin: No rash on exposed skin  Pertinent data.   Pleural fluid LDH 194, serum 155 Pleural fluid protein 4.6, serum 6.4  Cr 0.84 Alb 3.0  WBC 9.6 H/H 16.1/48.4  Assessment and Plan: I have seen and evaluated the patient as outlined above. I agree with the formulated Assessment and Plan as detailed in the residents' note, with the following changes:   1. Exudative pleural effusion - CT chest pending - DDx includes infectious processes (fluid culture pending), malignancy, related to pancreatitis or liver disease.  He has normal LFTs.  He has no symptoms of pancreatitis.  The most concerning issue would be malignancy given his prolonged smoking history.   - Oxygen to keep O2 saturation > 90.   2. HTN - Amlodipine 5mg  started for high blood pressure, and HTN improved - Continue amlodipine  3. Elevated glucose - Could consider A1C and  SSI if needed, last glucose on BMET was 156   Bradley Falcon, MD 7/29/201810:17 AM

## 2016-12-18 NOTE — Progress Notes (Signed)
   Subjective: Mr. Bradley Maldonado was resting comfortably in his bed today upon entering the room. He denied anxiety associated with his SOB confirming that it had improved with improved breathing. He was interested to know when he would be going home as well as what we though he had. It was explained that his symptoms and presentation were consistent with possible lung cancer vs infection and that we needed more information to make a determination. He denied, chest pain, abdominal pain, diarrhea, nausea, visual changes or muscle aches, fever or chillls.   Objective:  Vital signs in last 24 hours: Vitals:   12/17/16 1634 12/17/16 1700 12/17/16 2004 12/18/16 0452  BP: (!) 146/107 (!) 149/76 125/81 118/73  Pulse:  91 94 92  Resp:  20 20 20   Temp:  97.9 F (36.6 C) 99.6 F (37.6 C) 98.9 F (37.2 C)  TempSrc:  Oral Oral Oral  SpO2:  95% 92% 95%  Weight:  151 lb 8 oz (68.7 kg)  150 lb 3.2 oz (68.1 kg)  Height:  5\' 11"  (1.803 m)     Complete ROS negative except as per what is noted in subjective.  Physical Exam  Constitutional: He appears well-developed and well-nourished. No distress.  Cardiovascular: Normal rate and regular rhythm.   No murmur heard. Pulmonary/Chest: Effort normal and breath sounds normal. No respiratory distress. He has no wheezes.  Abdominal: Soft. Bowel sounds are normal. He exhibits no distension. There is no tenderness. There is no guarding.  Musculoskeletal: He exhibits no edema or tenderness.  Neurological: [name] alert.  Skin: Skin is warm and dry. [name] not diaphoretic. No erythema.  Psychiatric: He has a normal mood and affect. His behavior is normal.   Assessment/Plan:  Active Problems:   Pleural effusion, right   Hypertension  Pleural effusion: -IR withdrew 1.3 liters of fluid  -Fluid LDH, cytology, pH, glucose, protein, albumin, gram stain, triglycerides, cholesterol, acid fast smear, pending -Serum LDH, protein -LDH and protein most consistent with  exudative process, cytology pending  -CT chest demonstrated spiculated masses possibly obstructing outlet flow -BMP, CBC, BNP noncontributory, AST improved, hypalbuminemia present -Pulmonary consulted to visit patient to discuss further treatment once cytology report has returned  Hypertension: -Patient given 40mg  IV furosemide in ED -will continue monitor, but has currently resolved   -amlodipine 5mg  for HTN added  EtOH abuse: -CIWA with ativan ordered per protocol  -please monitor and advise as necessary   DVT ppx: SCD's Diet: Regular CODE: FULL Dispo: Anticipated discharge in approximately 1-2 day(s).   Kathi Ludwig, MD 12/18/2016, 12:15 PM Pager: Pager# [phone]

## 2016-12-19 ENCOUNTER — Inpatient Hospital Stay (HOSPITAL_COMMUNITY): Payer: Medicare Other

## 2016-12-19 ENCOUNTER — Encounter (HOSPITAL_COMMUNITY): Payer: Self-pay | Admitting: Pulmonary Disease

## 2016-12-19 DIAGNOSIS — J90 Pleural effusion, not elsewhere classified: Secondary | ICD-10-CM

## 2016-12-19 HISTORY — PX: IR THORACENTESIS ASP PLEURAL SPACE W/IMG GUIDE: IMG5380

## 2016-12-19 LAB — GRAM STAIN

## 2016-12-19 LAB — BODY FLUID CELL COUNT WITH DIFFERENTIAL
Eos, Fluid: 2 %
Lymphs, Fluid: 66 %
Monocyte-Macrophage-Serous Fluid: 15 % — ABNORMAL LOW (ref 50–90)
Neutrophil Count, Fluid: 17 % (ref 0–25)
WBC FLUID: 1465 uL — AB (ref 0–1000)

## 2016-12-19 LAB — ALBUMIN, PLEURAL OR PERITONEAL FLUID: ALBUMIN FL: 2.7 g/dL

## 2016-12-19 LAB — TRIGLYCERIDES, BODY FLUIDS: TRIGLYCERIDES FL: 38 mg/dL

## 2016-12-19 LAB — EXPECTORATED SPUTUM ASSESSMENT W GRAM STAIN, RFLX TO RESP C: Special Requests: NORMAL

## 2016-12-19 LAB — PROTEIN, PLEURAL OR PERITONEAL FLUID: TOTAL PROTEIN, FLUID: 4.3 g/dL

## 2016-12-19 LAB — GLUCOSE, PLEURAL OR PERITONEAL FLUID: GLUCOSE FL: 124 mg/dL

## 2016-12-19 LAB — EXPECTORATED SPUTUM ASSESSMENT W REFEX TO RESP CULTURE

## 2016-12-19 LAB — PH, BODY FLUID: pH, Body Fluid: 7.4

## 2016-12-19 LAB — LACTATE DEHYDROGENASE, PLEURAL OR PERITONEAL FLUID: LD FL: 227 U/L — AB (ref 3–23)

## 2016-12-19 LAB — GLUCOSE, CAPILLARY: GLUCOSE-CAPILLARY: 128 mg/dL — AB (ref 65–99)

## 2016-12-19 MED ORDER — GUAIFENESIN-DM 100-10 MG/5ML PO SYRP
5.0000 mL | ORAL_SOLUTION | ORAL | Status: DC | PRN
Start: 2016-12-19 — End: 2016-12-21

## 2016-12-19 MED ORDER — LIDOCAINE HCL (PF) 1 % IJ SOLN
INTRAMUSCULAR | Status: AC
  Filled 2016-12-19: qty 30

## 2016-12-19 NOTE — Consult Note (Signed)
[facility] Primary Care Navigator  12/19/2016  Bradley Maldonado [birth year] [id number]   Met with patientand friend Helene Kelp) at the bedside to identify possible discharge needs. Patient verbalized having worsening shortness of breath that led to this admission. Patient reports has not sought medical care in a long while inspite being assigned to one. Per [employer] (All Payer List), attributed primary care provider is Dr. Scarlette Calico with [facility] at [facility]. Patient agreed to contact Dr. Ronnald Ramp' office to establish care and schedule post hospital follow-up once discharged.   Patient reports using [facility] [place] to obtain medications without difficulty so far.  Patient mentioned managing his own medications at home straight out of the container ("not taking much").   Patient reportsthat his friend - Carloyn Manner (lives few doors away) provides transportation to his doctors'appointments.  Patient's friend Helene Kelp) has been providingassistance with care needs at home as stated.  Anticipated discharge plan is home according to patient with home health services per therapy recommendation.  Patient voicedunderstanding to follow-up with a primary care provider for a post discharge follow-up visit within a week or sooner if needs arise.Patient letter wasprovided as his reminder.  Explained to patient about [facility] CM services available for health management but hedenies any health managementneeds or concerns at this time. However, he expressed understanding to seekreferral to [facility] care managementservicesfrom primary care provider if deemed necessary in the future.   [facility] care management information provided for future needs that he may have.  For questions, please contact:  Dannielle Huh, BSN, RN- [facility] Primary Care Navigator  Telephone: [phone] [place]

## 2016-12-19 NOTE — Progress Notes (Signed)
Internal Medicine Attending:   I saw and examined the patient. I reviewed the resident's note and I agree with the resident's findings and plan as documented in the resident's note.  Patient feels better today. Denies any new complaints at this time. Patient was initially admitted for worsening shortness of breath secondary to right-sided pleural effusion and is now status post thoracentesis with pleural fluids consistent with an exudative process. Fluid cultures remained no growth to date and I doubt that this is an infectious process. Given his history of heavy smoking as well as family history of lung cancer I suspect that the etiology is likely secondary to an underlying malignancy. On exam today he was noted to have an inability to perform a downward gaze with his left eye. Given his presentation of likely underlying malignancy I'm concerned for possible metastatic disease and will obtain an MRI of his brain as well as his cervical spine to rule out metastatic process. Pulmonary follow-up and recommendations appreciated. Patient with 2 spiculated nodules in his right lung as well as likely post obstructive pneumonia. No antibiotics for now. Awaiting results of fluid cytology. If cytology results are not consistent with malignancy patient will likely require bronchoscopy with biopsy to determine etiology of these nodules. No further workup for now.

## 2016-12-19 NOTE — Progress Notes (Signed)
   Subjective: Bradley Maldonado was watching TV today upon entering the room. He denied concerns other than continued mild anxiety and restlessness at night. He was in agreement with waiting for a visit from Pulmonary today if the cytology labs returned. [name] anxious to go home, but understood that a diagnosis is needed. Denied chest pain other than with coughing, denied abdominal pain, constipation diarrhea, headache, visual changes, fever, chills, or shortness of breath.  Objective:  Vital signs in last 24 hours: Vitals:   12/18/16 1417 12/18/16 1634 12/18/16 2127 12/19/16 0642  BP: (!) 131/57 123/81 137/70 114/72  Pulse: 93 91 95 94  Resp: 18 18 18    Temp: 98.9 F (37.2 C) 99.1 F (37.3 C) 99.3 F (37.4 C) 98.3 F (36.8 C)  TempSrc: Oral Oral Oral Oral  SpO2: 94% 94% 95% 92%  Weight:    152 lb (68.9 kg)  Height:       Complete ROS negative except as per subjective.  Physical Exam  Constitutional: [name] oriented to person, place, and time. He appears well-developed. No distress.  HENT:  Head: Normocephalic and atraumatic.  Neck: Normal range of motion. Neck supple.  Cardiovascular: Normal rate, regular rhythm and normal heart sounds.   No murmur heard. Pulmonary/Chest: Effort normal and breath sounds normal. No respiratory distress. He has no wheezes.  Abdominal: Soft. Bowel sounds are normal. He exhibits no distension.  Musculoskeletal: Normal range of motion. He exhibits no edema or tenderness.  Neurological: [name] alert and oriented to person, place, and time.  Skin: Skin is warm and dry. Capillary refill takes less than 2 seconds. [name] not diaphoretic.  Psychiatric: He has a normal mood and affect. His behavior is normal.    Assessment/Plan:  Active Problems:   Pleural effusion, right   Hypertension  Pleural effusion: -LDH and protein most consistent with exudative process, cytology pending  -CT chest demonstrated spiculated masses possibly obstructing outlet  flow -BMP, CBC, BNP noncontributory from previous days labs, AST improved, hypalbuminemia persistent -Pulmonary stopped by to discuss care with the patient, will follow-up with labs  Focal neurologic deficit: -MRI Head and C-spine for ptosis, myosis, additional focal deficits  Hypertension: -Patient given 40mg  IV furosemide in ED -will continue monitor, but has currently resolved   -amlodipine 5mg  for HTN added  EtOH abuse: -CIWA with ativan ordered per protocol  -please monitor and advise as necessary   DVT ppx:  Diet: Regular CODE: FULL Dispo: Anticipated discharge in approximately 0-1 day(s).   Kathi Ludwig, MD 12/19/2016, 8:15 AM Pager: Pager# [phone]

## 2016-12-19 NOTE — Consult Note (Signed)
Name: Bradley Maldonado MRN: [id number] DOB: 02-Jul-1950    ADMISSION DATE:  12/17/2016 CONSULTATION DATE:  12/19/2016  REFERRING MD :  Dr. Daryll Drown  CHIEF COMPLAINT:  R sided effusion  HISTORY OF PRESENT ILLNESS:  66 year old male with no significant past medical history. He has a 40 pack year smoking history, drinks 2+ 40oz bottles of beer/malt liquor per day, and occasionally uses crack/cocaine and marijuana. He reports SOB that has significantly worsened over the past 3 weeks. Associated with orthopnea. He does have chronic cough, however, this has been going on for years and has not changed much with this acute dyspnea. Cough has been mildly productive. He denied fever, chills, hemoptysis, chest pain, peripheral edema.   He was found to have a large R sided pleural effusion and was admitted to the hospitalist team. He underwent R sided thoracentesis in IR 7/28 during which 1.2 liters of clear yellow fluid. CT scan 7/29 with residual effusion and was suggestive of an obstructing RML mass. Also 2 spiculated nodules in RUL. PCCM asked to see for further evaluation.   SIGNIFICANT EVENTS  7/28 admit for effusion, thora for 1.2 L  STUDIES:  7/29 CT chest > Large right pleural effusion with mild asymmetric right side pleural thickening. Airspace consolidation is identified involving the right middle lobe. A suggestion of central increased soft tissue surrounding the prox right middle lobe bronchus is identified. Cannot rule out central obstructing lesion. Consider further evaluation with bronchoscopy. There are 2 spiculated nodules identified within the right upper lobe measuring up to 1.3 cm.  PAST MEDICAL HISTORY :   has no past medical history on file.  has a past surgical history that includes Hip replacement. Prior to Admission medications   Medication Sig Start Date End Date Taking? Authorizing Provider  Omega-3 Fatty Acids (FISH OIL) 1000 MG CAPS Take 1,000 mg by mouth 2 (two) times a  week.   Yes [provider]   No Known Allergies  FAMILY HISTORY:  family history includes Cancer in his father; Cancer - Lung in his father; Diabetes in his mother; Lung cancer in his father. SOCIAL HISTORY:  reports that he has been smoking Cigarettes.  He has been smoking about 1.00 pack per day. He has never used smokeless tobacco. He reports that he drinks about 8.4 oz of alcohol per week . He reports that he uses drugs, including Marijuana.  REVIEW OF SYSTEMS:   Bolds are positive  Constitutional: weight loss, gain, night sweats, Fevers, chills, fatigue .  HEENT: headaches, Sore throat, sneezing, nasal congestion, post nasal drip, Difficulty swallowing, Tooth/dental problems, visual complaints visual changes, ear ache CV:  chest pain, radiates:,Orthopnea, PND, swelling in lower extremities dizziness, palpitations, syncope.  GI  heartburn, indigestion, abdominal pain, nausea, vomiting, diarrhea, change in bowel habits, loss of appetite, bloody stools.  Resp: cough, productive: , hemoptysis, dyspnea, chest pain, pleuritic.  Skin: rash or itching or icterus GU: dysuria, change in color of urine, urgency or frequency. flank pain, hematuria  MS: joint pain or swelling. decreased range of motion  Psych: change in mood or affect. depression or anxiety.  Neuro: difficulty with speech, weakness, numbness, ataxia    SUBJECTIVE:   VITAL SIGNS: Temp:  [98.3 F (36.8 C)-99.3 F (37.4 C)] 98.3 F (36.8 C) (07/30 0642) Pulse Rate:  [91-95] 94 (07/30 0642) Resp:  [18] 18 (07/29 2127) BP: (114-137)/(57-81) 114/72 (07/30 0642) SpO2:  [92 %-95 %] 92 % (07/30 0642) Weight:  [68.9 kg (152 lb)]  68.9 kg (152 lb) (07/30 9678)  PHYSICAL EXAMINATION: General:  Thin adult male in NAD Neuro:  Alert, oriented, non-focal HEENT:  /AT, PERRL, no JVD Cardiovascular:  RRR, no MRG Lungs: Coarse and diminished on R.  Abdomen:  Soft, non-tender, non-distended Musculoskeletal:  No acute  deformity or ROM limitation Skin:  Grossly intact   Recent Labs Lab 12/17/16 1212 12/17/16 1333 12/18/16 0424  NA 137 139 139  K 4.3 4.0 3.5  CL 103 104 103  CO2 23  --  28  BUN 6 6 6   CREATININE 0.83 0.70 0.84  GLUCOSE 101* 116* 156*    Recent Labs Lab 12/17/16 1212 12/17/16 1333 12/18/16 0424  HGB 16.2 17.3* 16.1  HCT 49.8 51.0 48.4  WBC 8.0  --  9.6  PLT 185  --  199   Dg Chest 1 View  Result Date: 12/17/2016 CLINICAL DATA:  Post right thoracentesis. EXAM: CHEST 1 VIEW COMPARISON:  Earlier the same date and 06/02/2005. FINDINGS: 1657 hours. Right pleural effusion has decreased in volume, although there is a moderate residual effusion. No evidence of pneumothorax. The right basilar aeration has mildly improved. Persistent mildly nodular interstitial prominence. The heart size and mediastinal contours are stable. IMPRESSION: No pneumothorax following right thoracentesis. The right pleural effusion and right basilar aeration has improved. Electronically Signed   By: Richardean Sale M.D.   On: 12/17/2016 17:05   Dg Chest 2 View  Result Date: 12/17/2016 CLINICAL DATA:  Shortness of breath. EXAM: CHEST  2 VIEW COMPARISON:  1/11/7 FINDINGS: There is a large right pleural effusion no left pleural effusion. Mild nodular interstitial abnormality appears new from previous exam and is indeterminate. The visualized osseous structures are unremarkable. IMPRESSION: 1. There is a large right pleural effusion. Additionally there is a new nodular interstitial abnormality. Both indeterminate. Suggest further evaluation with contrast enhanced CT of the chest to assess for underlying malignancy. Electronically Signed   By: Kerby Moors M.D.   On: 12/17/2016 12:40   Ct Chest W Contrast  Result Date: 12/18/2016 CLINICAL DATA:  Followup pulmonary nodules EXAM: CT CHEST WITH CONTRAST TECHNIQUE: Multidetector CT imaging of the chest was performed during intravenous contrast administration. CONTRAST:   32mL ISOVUE-300 IOPAMIDOL (ISOVUE-300) INJECTION 61% COMPARISON:  None. FINDINGS: Cardiovascular: Heart size is normal. Aortic atherosclerosis noted. No pericardial effusion. Calcifications in the LAD and left circumflex coronary artery noted. Mediastinum/Nodes: Right hilar lymph node measures 1.3 cm. No mediastinal or left hilar adenopathy. Thyroid gland, trachea, and esophagus demonstrate no significant findings. Lungs/Pleura: Moderate to advanced changes of centrilobular emphysema. Large right pleural effusion noted. There is mild asymmetric right-sided pleural thickening noted. Airspace densities within the right middle lobe identified. There is a a suggestion of increase soft tissue within the right hilar region and surrounding the right middle lobe bronchi, image 98 of series 3. Within the right upper lobe there is a subpleural nodule with spiculated margins measuring 1 cm. A second spiculated nodule is identified within the anterior apex measuring 1.3 cm. Upper Abdomen: No acute abnormality. Musculoskeletal: No chest wall abnormality. No acute or significant osseous findings. IMPRESSION: 1. Large right pleural effusion with mild asymmetric right side pleural thickening 2. Airspace consolidation is identified involving the right middle lobe. A suggestion of central increased soft tissue surrounding the prox right middle lobe bronchus is identified. Cannot rule out central obstructing lesion. Consider further evaluation with bronchoscopy. 3. There are 2 spiculated nodules identified within the right upper lobe measuring up to 1.3 cm.  Indeterminate. In light of the advanced smoking related changes, consider further evaluation with PET-CT. 4. Aortic atherosclerosis and multi vessel coronary artery calcification. Electronically Signed   By: Kerby Moors M.D.   On: 12/18/2016 09:16   US Thoracentesis Asp Pleural Space W/img Guide  Result Date: 12/18/2016 INDICATION: Shortness of breath. Large right pleural  effusion of unknown etiology. EXAM: ULTRASOUND GUIDED RIGHT THORACENTESIS MEDICATIONS: Lidocaine 1% subcutaneous COMPLICATIONS: None immediate. PROCEDURE: An ultrasound guided thoracentesis was thoroughly discussed with the patient and questions answered. The benefits, risks, alternatives and complications were also discussed. The patient understands and wishes to proceed with the procedure. Written consent was obtained. Ultrasound was performed to localize and mark an adequate pocket of fluid in the right chest. The area was then prepped and draped in the normal sterile fashion. 1% Lidocaine was used for local anesthesia. Under ultrasound guidance a Safe-T-Centesis catheter was introduced. Thoracentesis was performed. The catheter was removed and a dressing applied. FINDINGS: A total of approximately 1.2 L of clear yellow fluid was removed. Samples were sent to the laboratory as requested by the clinical team. IMPRESSION: Successful ultrasound guided right thoracentesis yielding 1.2 L of pleural fluid. Follow-up chest radiograph shows no pneumothorax. Electronically Signed   By: Lucrezia Europe M.D.   On: 12/18/2016 08:00    ASSESSMENT / PLAN:  Right sided pleural effusion: thoracentesis 7/28 for 1.2 L of clear yellow pleural fluid. Exudate by Light's criteria. Ddx is rather narrowed at this point with malignancy being most likely based on smoking history and lack of other medical history. - Will await further pleural fluid studies, primarily cytology, but culture as well.  - Defer further thoracentesis at this time as he is feeling better. - Repeat CXR in AM to determine rate of re-accumulation  - If no result on cytology, he will  require bronchoscopy for tissue sampling of RML lesion.  - Pulmonary hygiene   Likely COPD without acute exacerbation - PRN albuterol - Outpatient pulmonary follow up for PFT  Substance abuse - at risk withdrawal - Per primary  Georgann Housekeeper, AGACNP-BC [facility]  Pulmonology/Critical Care Pager [phone] or [phone]  12/19/2016 11:12 AM

## 2016-12-19 NOTE — Evaluation (Signed)
Physical Therapy Evaluation Patient Details Name: Bradley Maldonado MRN: [id number] DOB: Jan 28, 1951 Today's Date: 12/19/2016   History of Present Illness  Pt is a 66 y/o male admitted following increased SOB and productive cough. Imaging revealed R pleural effusion and nodules in the R upper lobe. Underwent R thoracentesis on 7/28. PMH includes HTN, hip replacement, current smoker, alcohol abuse, and illicit drug use.   Clinical Impression  Pt admitted secondary to problem above with deficits below. PTA, pt was independent with mobility with occasional use of cane. Upon eval, pt with L hip pain (baseline), decreased strength, decreased balance, and decreased cardiopulmonary status. Pt requiring supervision to min guard this session for mobility. Had some SOB at end of gait training and sats decreasing to 87% on 2L. Required 3L and seated rest to increase sats to 92%. Oxygen decreased back to 2L and sats maintained >90%. Recommending d/c recommendations below. Will continue to follow acutely to maximize functional mobility independence and safety.     Follow Up Recommendations Home health PT (home safety eval )    Equipment Recommendations  Rolling walker with 5" wheels    Recommendations for Other Services       Precautions / Restrictions Precautions Precautions: Other (comment) Precaution Comments: Watch O2 sats  Restrictions Weight Bearing Restrictions: No      Mobility  Bed Mobility Overal bed mobility: Needs Assistance Bed Mobility: Supine to Sit     Supine to sit: Supervision     General bed mobility comments: Supervision for safety and line management. Increased time required.   Transfers Overall transfer level: Needs assistance Equipment used: Rolling walker (2 wheeled) Transfers: Sit to/from Stand Sit to Stand: Min guard         General transfer comment: Min guard for safety. Demonstrated safe hand placement.   Ambulation/Gait Ambulation/Gait assistance: Min  guard;Supervision Ambulation Distance (Feet): 100 Feet Assistive device: Rolling walker (2 wheeled) Gait Pattern/deviations: Step-through pattern;Decreased stride length;Trunk flexed;Decreased weight shift to left;Antalgic Gait velocity: decreased Gait velocity interpretation: Below normal speed for age/gender General Gait Details: Slow, guarded gait. Pt with L hip pain at baseline, therefore demonstrated antalgic gait. Verbal cues for upright posture and proximity to device. SOB at end of gait training and oxygen sats decreasing to 87% on 2L. Required 3L of oxygen to come up to 92% in sitting. Returned to 2L and oxygen maintained >90%. Educated about use of RW at home to increase safety and stability.  Stairs            Wheelchair Mobility    Modified Rankin (Stroke Patients Only)       Balance Overall balance assessment: Needs assistance Sitting-balance support: No upper extremity supported;Feet supported Sitting balance-Leahy Scale: Good     Standing balance support: Bilateral upper extremity supported;During functional activity Standing balance-Leahy Scale: Poor Standing balance comment: Reliant on RW for stability                              Pertinent Vitals/Pain Pain Assessment: No/denies pain    Home Living Family/patient expects to be discharged to:: Private residence Living Arrangements: Alone Available Help at Discharge: Friend(s);Available PRN/intermittently Type of Home: Apartment Home Access: Level entry     Home Layout: One level Home Equipment: Cane - single point      Prior Function Level of Independence: Independent with assistive device(s)         Comments: Occasionally uses cane secondary to hip pain  Hand Dominance   Dominant Hand: Right    Extremity/Trunk Assessment   Upper Extremity Assessment Upper Extremity Assessment: Generalized weakness    Lower Extremity Assessment Lower Extremity Assessment: LLE  deficits/detail;Generalized weakness (sensory in tact ) LLE Deficits / Details: Pain in the hip at baseline.     Cervical / Trunk Assessment Cervical / Trunk Assessment: Kyphotic  Communication   Communication: No difficulties  Cognition Arousal/Alertness: Awake/alert Behavior During Therapy: WFL for tasks assessed/performed Overall Cognitive Status: Within Functional Limits for tasks assessed                                        General Comments      Exercises     Assessment/Plan    PT Assessment Patient needs continued PT services  PT Problem List Decreased strength;Decreased balance;Decreased mobility;Cardiopulmonary status limiting activity;Decreased knowledge of use of DME;Pain       PT Treatment Interventions DME instruction;Gait training;Functional mobility training;Therapeutic activities;Therapeutic exercise;Balance training;Neuromuscular re-education;Patient/family education    PT Goals (Current goals can be found in the Care Plan section)  Acute Rehab PT Goals Patient Stated Goal: to get better  PT Goal Formulation: With patient Time For Goal Achievement: 01/02/17 Potential to Achieve Goals: Good    Frequency Min 3X/week   Barriers to discharge Decreased caregiver support Lives alone, but friends can check on him     Co-evaluation               AM-PAC PT "6 Clicks" Daily Activity  Outcome Measure Difficulty turning over in bed (including adjusting bedclothes, sheets and blankets)?: None Difficulty moving from lying on back to sitting on the side of the bed? : A Little Difficulty sitting down on and standing up from a chair with arms (e.g., wheelchair, bedside commode, etc,.)?: Total Help needed moving to and from a bed to chair (including a wheelchair)?: A Little Help needed walking in hospital room?: A Little Help needed climbing 3-5 steps with a railing? : A Little 6 Click Score: 17    End of Session Equipment Utilized During  Treatment: Gait belt;Oxygen Activity Tolerance: Treatment limited secondary to medical complications (Comment) (decreased oxygen sats ) Patient left: in chair;with call bell/phone within reach Nurse Communication: Mobility status PT Visit Diagnosis: Other abnormalities of gait and mobility (R26.89);Unsteadiness on feet (R26.81)    Time: 7680-8811 PT Time Calculation (min) (ACUTE ONLY): 26 min   Charges:   PT Evaluation $PT Eval Moderate Complexity: 1 Mod PT Treatments $Gait Training: 8-22 mins   PT G Codes:        Leighton Ruff, PT, DPT  Acute Rehabilitation Services  Pager: [phone]   Rudean Hitt 12/19/2016, 12:10 PM

## 2016-12-19 NOTE — Procedures (Signed)
Ultrasound-guided diagnostic and therapeutic right thoracentesis performed yielding 2.1 liters of serous colored fluid. No immediate complications. Follow-up chest x-ray pending.       Cleatis Fandrich E 3:14 PM 12/19/2016

## 2016-12-20 ENCOUNTER — Inpatient Hospital Stay (HOSPITAL_COMMUNITY): Payer: Medicare Other

## 2016-12-20 ENCOUNTER — Other Ambulatory Visit: Payer: Self-pay

## 2016-12-20 DIAGNOSIS — C3490 Malignant neoplasm of unspecified part of unspecified bronchus or lung: Secondary | ICD-10-CM

## 2016-12-20 NOTE — Progress Notes (Signed)
   Subjective: Bradley Maldonado was resting comfortably in his bed today upon entering the room. He was ecstatic about his reduced shortness of breath. He no longer had anxiety with lying prone. His orthopnea had improved to the extent that he no longer was concerned. Although he concluded that he was willing to remain hospitalized one additional day for completion of his evaluation, he states that he must return home soon as he has been gone far too long now. He denied chest pain, nausea, acute visual changes, shortness of breath, abdominal pain, vomiting or diarrhea. He did specifically request to see a physician in our clinic for follow-up as his PCP to which Dr. Dareen Piano agreed.   Objective:  Vital signs in last 24 hours: Vitals:   12/19/16 2157 12/20/16 0000 12/20/16 0533 12/20/16 0600  BP: 124/74  118/64   Pulse: (!) 104 97 95 89  Resp:   17   Temp:   99.4 F (37.4 C)   TempSrc:   Oral   SpO2: 93%  94%   Weight:   145 lb (65.8 kg)   Height:       Complete ROS negative except as per subjective.  Physical Exam  Constitutional: [name] oriented to person, place, and time. No distress.  HENT:  Head: Normocephalic and atraumatic.  Eyes: Right eye exhibits no discharge. Left eye exhibits no discharge. No scleral icterus.  Neck: Normal range of motion. Neck supple.  Cardiovascular: Normal rate, regular rhythm and normal heart sounds.   No murmur heard. Pulmonary/Chest: Effort normal and breath sounds normal. No respiratory distress. He has no wheezes.  Abdominal: Soft. Bowel sounds are normal. He exhibits no distension. There is no tenderness. There is no guarding.  Musculoskeletal: Normal range of motion. He exhibits no edema or tenderness.  Neurological: [name] alert and oriented to person, place, and time.  Skin: Skin is warm and dry. [name] not diaphoretic. No erythema.  Psychiatric: He has a normal mood and affect. His behavior is normal.    Assessment/Plan:  Active Problems:  Pleural effusion, right   Hypertension  Pleural effusion: -LDH and protein most consistent with exudative process, cytology still pending  -thoracentesis repeated yesterday, 2.1 liters removed, labs again sent and pending cytology  -CT chest demonstrated spiculated masses possibly obstructing outlet flow -Pulmonary following, awaiting cytology  -will continue   Focal neurologic deficit: -MRI Head and C-spine for ptosis, myosis, additional focal deficits pending following his refusal yesterday 2/2 anxiety -states that he will attempt to complete today  Hypertension: -Patient given 40mg  IV furosemide in ED -will continue monitor, but has currently resolved  -amlodipine 5mg  for HTN added  EtOH abuse: -CIWA discontinued as scores have all returned zero by day three of admission  -please monitor and advise as necessary   DVT ppx:  Diet: Regular CODE: FULL Dispo: Anticipated discharge in approximately 1 day(s).   Kathi Ludwig, MD 12/20/2016, 10:26 AM Pager: Pager# [phone]

## 2016-12-20 NOTE — Discharge Instructions (Signed)
Please follow-up with at your appointment on August 6th.

## 2016-12-20 NOTE — Progress Notes (Signed)
Placed call to cytology who verbally stated that the patients pleural effusion was positive for malignancy. The pathologist stated that additional specific information would follow shortly upon completion of her staining and procedures.

## 2016-12-20 NOTE — Progress Notes (Signed)
Pt refused to go any further with MRI exam. Unable to achieve Cervical spine exam, unable to achieve contrast injection.

## 2016-12-20 NOTE — Progress Notes (Signed)
Internal Medicine Attending:   I saw and examined the patient. I reviewed the resident's note and I agree with the resident's findings and plan as documented in the resident's note.  Patient feels better today with no new complaints. Patient had repeat thoracentesis done yesterday with removal of approximately 2.1 L of fluid. Patient was initially admitted to [facility] with worsening shortness of breath and found to have a right-sided pleural effusion as well as spiculated nodules on his CT. He initially had approximately 1 L removed by thoracentesis the the day he was admitted. Resident spoke with pathology lab who state that malignant cells were identified in the pleural fluid. Will await final report. Patient will need outpatient follow-up with oncology as well as PCP (wants to follow-up in our clinic). Patient is also noted to have likely nerve palsy in his left eye (unable to look down with his left eye). Given his history of likely malignancy this is concerning for possible metastatic disease. We'll follow-up MRI of brain and C-spine. Likely discharge home in the morning. If patient has recurrent pleural effusion he may need pleurodesis in the future.

## 2016-12-20 NOTE — Progress Notes (Signed)
Spoke with IM residents, they are aware of partially completed MRI. Cardiac monitoring also addressed, there is not active order to have on tele box. Pt currently just being monitored by O2 pleth. MD OK for patient to remain off monitor.

## 2016-12-20 NOTE — Discharge Summary (Signed)
 Name: Bradley Maldonado MRN: [id number] DOB: 04/02/1951 66 y.o. PCP: Patient, No Pcp Per  Date of Admission: 12/17/2016 12:54 PM Date of Discharge: 12/21/2016 Attending Physician: Dr. Granfortuna, MD  Discharge Diagnosis: Active Problems:   Pleural effusion, malignant   Hypertension   Lung cancer (HCC)   Status post thoracentesis   Chronic obstructive pulmonary disease (HCC)   Discharge Medications: Allergies as of 12/21/2016   No Known Allergies     Medication List    TAKE these medications   amLODipine 5 MG tablet Commonly known as:  NORVASC Take 1 tablet (5 mg total) by mouth daily.   Fish Oil 1000 MG Caps Take 1,000 mg by mouth 2 (two) times a week.   guaiFENesin-dextromethorphan 100-10 MG/5ML syrup Commonly known as:  ROBITUSSIN DM Take 5 mLs by mouth every 4 (four) hours as needed for cough.   SPIRIVA RESPIMAT 1.25 MCG/ACT Aers Generic drug:  Tiotropium Bromide Monohydrate Inhale 2 puffs into the lungs daily.       Disposition and follow-up:   Bradley Maldonado was discharged from [facility] in stable condition.  At the hospital follow up visit please address:  1.  Cytology report, Oncology follow-up  2.  Labs / imaging needed at time of follow-up: CBC, CMP  3.  Pending labs/ test needing follow-up: n/a  Follow-up Appointments: Follow-up Information    [name], [name], MD Follow up.   Specialty:  Oncology Why:  Please follow up with Dr. [name] at the [facility].  They will contact you for an appointment shortly.  If you do not hear from them by the end of this week, please call them. Contact information: [address] [place] [address] [phone]        [facility] - Dme Follow up.   Why:  Oxygen.  Contact information: [address] [place] [address] [phone]        Care, [facility] Follow up.   Specialty:  Home Health Services Why:  Physical Therapy Contact  information: [address] [place] [address] [phone]           Hospital Course by problem list: Active Problems:   Pleural effusion, malignant   Hypertension   Lung cancer (HCC)   Status post thoracentesis   Chronic obstructive pulmonary disease (HCC)  Pleural Effusion/Lung cancer: Bradley Maldonado is a pleasant 66-year-old male who presented for severe shortness of breath of nine days length. He stated that prior to the occurrence of his SOB, he was able to tolerate long bike rides in his neighborhood, and denied chest pain or SOB prior. He decided to visit the [facility] due to severe anxiety accompanying his orthopnea. It was to such an extent that he could not lie down until he passed out from exhaustion. Upon presentation to the ED, a CXR was performed demonstrating a large right sided pleural effusion. IR removed 1.2 liters initially and sent the fluid off for complete evaluation. Based on Light's criteria, the fluid was determined to be exudative in nature.  CT chest revealed two 1.3cm spiculated masses in the right upper lobe as well as additional changes indicating possible central obstructing lesion. Due to improved but continued SOB repeated CXR revealed persistent pleural effusion prompting a second thoracentesis. 2.1 liters was removed also being sent for evaluation. Initial cytology reports were that the patients effusion was positive for malignancy. Further evaluation revealed non small cell carcinoma and adenocarcinoma.   He will follow up with Dr. Curt Bears at the [facility] for further evaluation and treatment.   Pulmonology was also  involved with the patient's care and recommended to start Spiriva due to new diagnosis of COPD and agreed with home oxygen support for the patient.   Hypertension: Given the patients lack of previous desire to seek medical care, he was noted to be newly hypertensive. He was started on amlodipine 68m with  recommendation to follow-up with outpatient care. He stated twice that he would like to follow-up with the internal medicine outpatient clinic for his PCP as he had never met the physician he was assigned to and liked our team. We agreed to set up a follow-up appointment for August 6th at 9:45 AM.  Discharge Vitals:   BP 122/73 (BP Location: Left Arm)   Pulse (!) 59   Temp 98.6 F (37 C) (Oral)   Resp 18   Ht 5' 11" (1.803 m)   Wt 144 lb (65.3 kg)   SpO2 90%   BMI 20.08 kg/m   Pertinent Labs, Studies, and Procedures:   CMP Latest Ref Rng & Units 12/18/2016 12/17/2016 12/17/2016  Glucose 65 - 99 mg/dL 156(H) 116(H) 101(H)  BUN 6 - 20 mg/dL _0 Creatinine 0.61 - 1.24 mg/dL 0.84 0.70 0.83  Sodium 135 - 145 mmol/L 139 139 137  Potassium 3.5 - 5.1 mmol/L 3.5 4.0 4.3  Chloride 101 - 111 mmol/L 103 104 103  CO2 22 - 32 mmol/L 28 - 23  Calcium 8.9 - 10.3 mg/dL 8.7(L) - 8.8(L)  Total Protein 6.5 - 8.1 g/dL 6.4(L) - -  Total Bilirubin 0.3 - 1.2 mg/dL 1.0 - -  Alkaline Phos 38 - 126 U/L 87 - -  AST 15 - 41 U/L 52(H) - -  ALT 17 - 63 U/L 35 - -   CBC Latest Ref Rng & Units 12/18/2016 12/17/2016 12/17/2016  WBC 4.0 - 10.5 K/uL 9.6 - 8.0  Hemoglobin 13.0 - 17.0 g/dL 16.1 17.3(H) 16.2  Hematocrit 39.0 - 52.0 % 48.4 51.0 49.8  Platelets 150 - 400 K/uL 199 - 185    Discharge Instructions: Discharge Instructions    Call MD for:  difficulty breathing, headache or visual disturbances    Complete by:  As directed    Call MD for:  temperature >100.4    Complete by:  As directed    Diet - low sodium heart healthy    Complete by:  As directed    Discharge instructions    Complete by:  As directed    [facility]   Follow up with Internal Medicine Clinic on Monday, August 6, at 9:45 AM  Continue Amlodipine 5 mg once daily.   Face-to-face encounter (required for Medicare/Medicaid patients)    Complete by:  As  directed    I SMelanee Sprycertify that this patient is under my care and that I, or a nurse practitioner or physician's assistant working with me, had a face-to-face encounter that meets the physician face-to-face encounter requirements with this patient on 12/21/2016. The encounter with the patient was in whole, or in part for the following medical condition(s) which is the primary reason for home health care (List medical condition): hypoxia and newly diagnosed lung cancer.   The encounter with the patient was in whole, or in part, for the following medical condition, which is the primary reason  for home health care:  Hypoxia, lung cancer   I certify that, based on my findings, the following services are medically necessary home health services:  Physical therapy   Reason for Medically Necessary Home Health Services:  Therapy- Therapeutic Exercises to Increase Strength and Endurance   My clinical findings support the need for the above services:  Shortness of breath with activity   Further, I certify that my clinical findings support that this patient is homebound due to:  Shortness of Breath with activity   Home Health    Complete by:  As directed    To provide the following care/treatments:  PT   Increase activity slowly    Complete by:  As directed        Signed: Lacroce, Samantha J, MD 12/22/2016, 1:51 PM   Pager: Pager# [phone] 

## 2016-12-21 ENCOUNTER — Telehealth: Payer: Self-pay

## 2016-12-21 ENCOUNTER — Inpatient Hospital Stay (HOSPITAL_COMMUNITY): Payer: Medicare Other

## 2016-12-21 DIAGNOSIS — F17200 Nicotine dependence, unspecified, uncomplicated: Secondary | ICD-10-CM

## 2016-12-21 DIAGNOSIS — J449 Chronic obstructive pulmonary disease, unspecified: Secondary | ICD-10-CM

## 2016-12-21 DIAGNOSIS — J910 Malignant pleural effusion: Secondary | ICD-10-CM

## 2016-12-21 DIAGNOSIS — Z9981 Dependence on supplemental oxygen: Secondary | ICD-10-CM

## 2016-12-21 DIAGNOSIS — C3411 Malignant neoplasm of upper lobe, right bronchus or lung: Secondary | ICD-10-CM

## 2016-12-21 DIAGNOSIS — Z9889 Other specified postprocedural states: Secondary | ICD-10-CM

## 2016-12-21 LAB — CHOLESTEROL, BODY FLUID: Cholesterol, Fluid: 87 mg/dL

## 2016-12-21 LAB — CULTURE, RESPIRATORY W GRAM STAIN: Special Requests: NORMAL

## 2016-12-21 LAB — CULTURE, RESPIRATORY

## 2016-12-21 MED ORDER — GUAIFENESIN-DM 100-10 MG/5ML PO SYRP: 5.0000 mL | ORAL_SOLUTION | ORAL | 0 refills | Status: DC | PRN

## 2016-12-21 MED ORDER — AMLODIPINE BESYLATE 5 MG PO TABS: 5.0000 mg | ORAL_TABLET | Freq: Every day | ORAL | 3 refills | Status: DC

## 2016-12-21 MED ORDER — SPIRIVA RESPIMAT 1.25 MCG/ACT IN AERS: 1.2500 ug | INHALATION_SPRAY | Freq: Every day | RESPIRATORY_TRACT | 1 refills | Status: DC

## 2016-12-21 MED ORDER — SPIRIVA RESPIMAT 1.25 MCG/ACT IN AERS: 2.0000 | INHALATION_SPRAY | Freq: Every day | RESPIRATORY_TRACT | 1 refills | Status: DC

## 2016-12-21 NOTE — Telephone Encounter (Signed)
Bradley Maldonado with [facility] needs to speak with a nurse about home health orders. Please call back.

## 2016-12-21 NOTE — Care Management Important Message (Signed)
Important Message  Patient Details  Name: Bradley Maldonado MRN: [id number] Date of Birth: 1950/11/18   Medicare Important Message Given:  Yes    Nathen May 12/21/2016, 12:24 PM

## 2016-12-21 NOTE — Progress Notes (Signed)
Name: Bradley Maldonado MRN: [id number] DOB: 1950/09/29    ADMISSION DATE:  12/17/2016 CONSULTATION DATE:  12/19/2016  REFERRING MD :  Dr. Daryll Drown  CHIEF COMPLAINT:  R sided effusion  HISTORY OF PRESENT ILLNESS:  66 year old male with no significant past medical history. He has a 40 pack year smoking history, drinks 2+ 40oz bottles of beer/malt liquor per day, and occasionally uses crack/cocaine and marijuana. He reports SOB that has significantly worsened over the past 3 weeks. Associated with orthopnea. He does have chronic cough, however, this has been going on for years and has not changed much with this acute dyspnea. Cough has been mildly productive. He denied fever, chills, hemoptysis, chest pain, peripheral edema.   He was found to have a large R sided pleural effusion and was admitted to the hospitalist team. He underwent R sided thoracentesis in IR 7/28 during which 1.2 liters of clear yellow fluid. CT scan 7/29 with residual effusion and was suggestive of an obstructing RML mass. Also 2 spiculated nodules in RUL. PCCM asked to see for further evaluation.   SIGNIFICANT EVENTS  7/28 admit for effusion, thora for 1.2 L  STUDIES:  7/29 CT chest > Large right pleural effusion with mild asymmetric right side pleural thickening. Airspace consolidation is identified involving the right middle lobe. A suggestion of central increased soft tissue surrounding the prox right middle lobe bronchus is identified. Cannot rule out central obstructing lesion. Consider further evaluation with bronchoscopy. There are 2 spiculated nodules identified within the right upper lobe measuring up to 1.3 cm.   SUBJECTIVE:  Feeling much better.  Denies SOB.  Discussed cytology results.   VITAL SIGNS: Temp:  [99 F (37.2 C)-99.1 F (37.3 C)] 99 F (37.2 C) (08/01 0622) Pulse Rate:  [87-104] 104 (08/01 0622) Resp:  [17-18] 17 (08/01 0622) BP: (106-119)/(67-78) 119/78 (08/01 0622) SpO2:  [92 %-96 %] 96 %  (08/01 0622) Weight:  [65.3 kg (144 lb)] 65.3 kg (144 lb) (08/01 0622)  PHYSICAL EXAMINATION: General:  Thin adult male in NAD  Neuro:  Pleasant, Alert, oriented, non-focal HEENT:  [place]/AT, PERRL, no JVD Cardiovascular:  RRR, no MRG Lungs: resps even non labored on RA, slightly diminished on R.  Abdomen:  Soft, non-tender, non-distended Musculoskeletal:  No acute deformity or ROM limitation Skin:  Grossly intact   Recent Labs Lab 12/17/16 1212 12/17/16 1333 12/18/16 0424  NA 137 139 139  K 4.3 4.0 3.5  CL 103 104 103  CO2 23  --  28  BUN 6 6 6   CREATININE 0.83 0.70 0.84  GLUCOSE 101* 116* 156*    Recent Labs Lab 12/17/16 1212 12/17/16 1333 12/18/16 0424  HGB 16.2 17.3* 16.1  HCT 49.8 51.0 48.4  WBC 8.0  --  9.6  PLT 185  --  199   Dg Chest 1 View  Result Date: 12/19/2016 CLINICAL DATA:  Thoracentesis. EXAM: CHEST 1 VIEW COMPARISON:  12/17/2016.  06/02/2005. FINDINGS: Mediastinum and hilar structures normal. Cardiomegaly with normal pulmonary vascularity. Right mid lung field subsegmental atelectasis and infiltrate. Small right pleural effusion. Interim near complete clearing of previously identified prominent right pleural effusion on 12/17/2016. IMPRESSION: 1. Interim near complete clearing of right pleural effusion. 2.  Subsegmental atelectasis and infiltrate right mid lung. Electronically Signed   By: Marcello Moores  Register   On: 12/19/2016 15:47   Mr Brain Wo Contrast  Result Date: 12/20/2016 CLINICAL DATA:  Focal neurologic deficit.  Lung cancer EXAM: MRI HEAD WITHOUT CONTRAST TECHNIQUE:  Multiplanar, multiecho pulse sequences of the brain and surrounding structures were obtained without intravenous contrast. COMPARISON:  None. FINDINGS: Brain: The patient was not able to complete the study. The study is diagnostic however there is motion on some of the sequences and the patient was not able to complete coronal T2 imaging. Ventricle size normal. Negative for acute infarct.  Scattered small white matter hyperintensities bilaterally appear chronic. Negative for hemorrhage or mass. Vascular: Normal arterial flow voids Skull and upper cervical spine: Negative Sinuses/Orbits: Negative Other: None IMPRESSION: No acute abnormality. Mild chronic white matter changes likely due to microvascular ischemia. Electronically Signed   By: Franchot Gallo M.D.   On: 12/20/2016 20:58   Ir Thoracentesis Asp Pleural Space W/img Guide  Result Date: 12/19/2016 INDICATION: Admitted with dyspnea secondary to a large right pleural effusion. Prior thoracentesis performed yesterday shield a 1.2 L. Request is made for repeat thoracentesis today. EXAM: ULTRASOUND GUIDED DIAGNOSTIC AND THERAPEUTIC THORACENTESIS MEDICATIONS: 1% xylocaine COMPLICATIONS: None immediate. PROCEDURE: An ultrasound guided thoracentesis was thoroughly discussed with the patient and questions answered. The benefits, risks, alternatives and complications were also discussed. The patient understands and wishes to proceed with the procedure. Written consent was obtained. Ultrasound was performed to localize and mark an adequate pocket of fluid in the right chest. The area was then prepped and draped in the normal sterile fashion. 1% Lidocaine was used for local anesthesia. Under ultrasound guidance a Safe-T-Centesis catheter was introduced. Thoracentesis was performed. The catheter was removed and a dressing applied. FINDINGS: A total of approximately 2.1 L of serous fluid was removed. Samples were sent to the laboratory as requested by the clinical team. IMPRESSION: Successful ultrasound guided right thoracentesis yielding 2.1 L of pleural fluid. Read by: Saverio Danker, PA-C Electronically Signed   By: Marybelle Killings M.D.   On: 12/19/2016 15:31    ASSESSMENT / PLAN:  66 y.o. male with long-standing history of tobacco use presenting with large right pleural effusion and purulent mucus production. Seemingly acute hypoxic respiratory  failure likely secondary to pleural effusion and subsequent atelectasis. Cytology now confirms suspicion on malignancy.    Malignant R pleural effusion:   S/p thoracentesis 7/28 for 1.2 L .  Cytology shows MALIGNANT CELLS CONSISTENT WITH METASTATIC NON-SMALL CELL CARCINOMA.  MRI brain neg for mets.  Hypoxia -- Likely secondary to atelectasis from pleural fluid  PLAN-  - Defer further thoracentesis at this time - if fluid re-accumulates consider pleur-x catheter  - needs heme/onc involvement - Pulmonary hygiene  - ambulatory desat prior to d/c home   Likely COPD without acute exacerbation - PRN albuterol - Outpatient pulmonary follow up for PFT  Substance abuse - at risk withdrawal - Per primary   Discussed cytology results at length with pt.   Ready for d/c home from PCCM standpoint - likely needs home O2   Nickolas Madrid, NP 12/21/2016  9:29 AM Pager: [phone] or [phone]

## 2016-12-21 NOTE — Care Management Note (Signed)
Case Management Note  Patient Details  Name: Bradley Maldonado MRN: [id number] Date of Birth: Jan 05, 1951  Subjective/Objective: Pt presented for SOB- CT revealed Right Pleural Effusion. Pt is from home alone- lives in an apartment. Per pt he has friends that can assist with picking up medications. Per pt he has no PCP-CM to speak with Internal Medicine in regards to possibly scheduling the patient at the clinic. Appointment placed for August 6th.  PT/OT recommendations for [facility] PT- pt is agreeable.                      Action/Plan: Agency List provided and pt chose [facility] for [place] and DME via [facility] CM did make referral with [facility] and SOC to begin within 24-48 hours post d/c. Referral made to liaison Butch Penny with [facility] and DME 02 to be delivered to room prior to d/c. Pt has transportation home. No further needs identified from CM.    Expected Discharge Date:  12/21/16               Expected Discharge Plan:  [place]  In-House Referral:  NA  Discharge planning Services  CM Consult  Post Acute Care Choice:  Home Health, Durable Medical Equipment Choice offered to:  Patient  DME Arranged:  Oxygen DME Agency:  [place]:  PT [place]:  [place]  Status of Service:  Completed, signed off  If discussed at [place] of Stay Meetings, dates discussed:    Additional Comments:  Bethena Roys, RN 12/21/2016, 11:18 AM

## 2016-12-21 NOTE — Discharge Summary (Signed)
Medicine attending discharge note: I personally examined this patient on the day of discharge and I attest to the accuracy of the discharge evaluation and plan as recorded by resident physicians Dr. Kathi Ludwig and Dr. Rochele Pages. 66 year old heavy smoker who presented on the day of admission July 28 with a 9 day history of progressive dyspnea.  Chest radiograph showed a large right pleural effusion.  A diagnostic and therapeutic thoracentesis was done on July 28 with a repeat procedure on July 30.  Fluid had characteristics of an exudate with total protein 4.6 g.  Cell count consisted primarily of lymphocytes.  Fluid was not hemorrhagic.  Fluid cytology consistent with metastatic adenocarcinoma.  CT scan of the chest showed a 1 cm subpleural spiculated nodule in the right upper lobe with a second apical 1.3 cm spiculated mass.  Right hilar fullness without definite adenopathy.  There was a suggestion of increased soft tissue surrounding the proximal right middle lobe bronchus.  Cannot rule out central obstructing lesion.  Other changes consistent with advanced obstructive airway disease.  Almost the entire liver was imaged and I do not appreciate any focal mass.  Noncontrast MRI of the brain showed no focal mass. Hemoglobin 16.2.  BUN 6.  Creatinine 0.8.  Calcium 8.8.  Albumin 3.0.  Sodium 139.  SGOT 52, SGPT 35, bilirubin 1.0, alkaline phosphatase 87. No cervical supraclavicular or axillary adenopathy on exam.  Impression: Stage IV adenocarcinoma of the lung presenting with a malignant right pleural effusion  Disposition: Condition stable at time of discharge I have personally contacted oncology and a follow-up appointment will be arranged.  He will need a PET scan.  Additional studies to be done on the biopsy material.  He may need a bronchoscopy with additional biopsies for molecular studies and treatment planning. There were no complications.

## 2016-12-21 NOTE — Progress Notes (Signed)
SATURATION QUALIFICATIONS: (This note is used to comply with regulatory documentation for home oxygen)  Patient Saturations on Room Air at Rest = 88%  Patient Saturations on Room Air while Ambulating = 88%  Patient Saturations on 2 Liters of oxygen while Ambulating = 93%  Please briefly explain why patient needs home oxygen: Pt desats on room air and while walking

## 2016-12-21 NOTE — Plan of Care (Signed)
Problem: Pain Managment: Goal: General experience of comfort will improve Outcome: Progressing Patient has no complaints of pain.   Problem: Physical Regulation: Goal: Ability to maintain clinical measurements within normal limits will improve Outcome: Progressing Patient refused the completion of MRI, stating "I was there for an hour, that's long enough." MRI of brain W/O contract was completed. Cervical spine and brain W/ contrast was not.   Problem: Fluid Volume: Goal: Ability to maintain a balanced intake and output will improve Outcome: Progressing I&O's monitored, patient uses bedside urinal.

## 2016-12-21 NOTE — Progress Notes (Signed)
   Subjective: No acute events overnight. Patient was seen at bedside with the team. He was resting comfortably in bed. He states that he is no longer having any difficulty breathing and feels much improved. He states that he wants to go home to check on his house, but was agreeable to stay for the pathology results. He asked what caused the fluid in his lung, which was explained to him, and he acknowledged understanding.   Objective:  Vital signs in last 24 hours: Vitals:   12/20/16 1356 12/20/16 2100 12/21/16 0100 12/21/16 0622  BP: 106/67 115/68  119/78  Pulse: 98 87  (!) 104  Resp: 18 18  17   Temp: 99.1 F (37.3 C) 99 F (37.2 C)  99 F (37.2 C)  TempSrc: Oral Oral  Oral  SpO2: 92% 94% 95% 96%  Weight:    144 lb (65.3 kg)  Height:       Physical Exam  Constitutional: [name] oriented to person, place, and time. No distress.  HENT:  Head: Normocephalic and atraumatic.  Eyes: Conjunctivae are normal. No scleral icterus. Left eye exhibits abnormal extraocular motion.  Neck: Normal range of motion. Neck supple.  Cardiovascular: Normal rate, regular rhythm and normal heart sounds.   No murmur heard. Pulmonary/Chest: Effort normal and breath sounds normal. No respiratory distress. He has no wheezes.  Abdominal: Soft. Bowel sounds are normal. He exhibits no distension. There is no tenderness. There is no guarding.  Musculoskeletal: Normal range of motion. He exhibits no edema or tenderness.  Lymphadenopathy:    He has no cervical adenopathy.    He has no axillary adenopathy.  Neurological: [name] alert and oriented to person, place, and time.  Skin: Skin is warm and dry. [name] not diaphoretic. No erythema.  Psychiatric: He has a normal mood and affect. His behavior is normal.    Assessment/Plan:  Active Problems:   Pleural effusion, right   Hypertension   Lung cancer (HCC)  Pleural effusion, malignant: Cytology results from pleural fluid were consistent with adenocarcinoma and  non-small cell lung cancer -Last CXR on 7/30 showed interim near complete clearing of right pleural effusion s/p thoracentesis x2 -Repeat CXR today to check for recurrence, consider IR for pleural catheter if recurs  -Will follow up with Dr. Julien Nordmann at [facility] after discharge for additional work up    Focal neurologic deficit: Ptosis of left eyelid, myosis, and left eye gaze abnormalities, with concern for metastasis -Patient refused to complete MRI head w/ contrast or C-spine in entirety, but MRI was diagnostic and showed no acute abnormality with mild chronic white matter changes likely due to microvascular ischemia.  Hypertension: BP 119/78 today -Continue Amlodipine 5mg    Dispo: Anticipated discharge today.   Melanee Spry, MD 12/21/2016, 9:51 AM Pager: [phone]

## 2016-12-21 NOTE — Telephone Encounter (Signed)
Rtc, lm for rtc 

## 2016-12-22 ENCOUNTER — Telehealth: Payer: Self-pay | Admitting: General Practice

## 2016-12-22 ENCOUNTER — Other Ambulatory Visit: Payer: Self-pay | Admitting: Internal Medicine

## 2016-12-22 ENCOUNTER — Telehealth: Payer: Self-pay | Admitting: *Deleted

## 2016-12-22 DIAGNOSIS — J910 Malignant pleural effusion: Secondary | ICD-10-CM

## 2016-12-22 LAB — CULTURE, BODY FLUID W GRAM STAIN -BOTTLE: Culture: NO GROWTH

## 2016-12-22 LAB — CULTURE, BODY FLUID-BOTTLE

## 2016-12-22 NOTE — Telephone Encounter (Signed)
Oncology Nurse Navigator Documentation  Oncology Nurse Navigator Flowsheets 12/22/2016  Navigator Location [facility]  Referral date to RadOnc/MedOnc 12/22/2016  Navigator Encounter Type Telephone/I received referral on Bradley Maldonado today.  I called and spoke with patient.  I updated him on appt time, place and what to expect.  He verbalized understanding of appt.   Telephone Outgoing Call  Treatment Phase Pre-Tx/Tx Discussion  Barriers/Navigation Needs Coordination of Care;Education  Education Other  Interventions Coordination of Care;Education  Coordination of Care Appts  Education Method Verbal  Acuity Level 2  Acuity Level 2 Educational needs;Assistance expediting appointments  Time Spent with Patient 30

## 2016-12-22 NOTE — Telephone Encounter (Signed)
SREE FROM [facility] CALLED ABOUT SETTING UP P/T FOR PATIENT.

## 2016-12-23 ENCOUNTER — Inpatient Hospital Stay: Payer: Medicare Other | Admitting: Adult Health

## 2016-12-24 LAB — CULTURE, BODY FLUID W GRAM STAIN -BOTTLE: Culture: NO GROWTH

## 2016-12-26 ENCOUNTER — Ambulatory Visit: Payer: Medicare Other

## 2016-12-26 ENCOUNTER — Encounter: Payer: Self-pay | Admitting: General Practice

## 2016-12-30 ENCOUNTER — Other Ambulatory Visit: Payer: Medicare Other

## 2016-12-30 ENCOUNTER — Ambulatory Visit: Payer: Medicare Other | Admitting: Internal Medicine

## 2017-01-05 ENCOUNTER — Encounter: Payer: Self-pay | Admitting: *Deleted

## 2017-01-05 NOTE — Progress Notes (Signed)
Oncology Nurse Navigator Documentation  Oncology Nurse Navigator Flowsheets 01/05/2017  Navigator Location [facility]  Navigator Encounter Type Other/Patient was a recent no show for an appt with Dr. Julien Nordmann.  I contacted scheduling to re-schedule with Dr. Julien Nordmann or APP next week.   Treatment Phase Pre-Tx/Tx Discussion  Barriers/Navigation Needs Coordination of Care  Interventions Coordination of Care  Coordination of Care Other  Acuity Level 1  Time Spent with Patient 15

## 2017-01-06 ENCOUNTER — Telehealth: Payer: Self-pay | Admitting: Internal Medicine

## 2017-01-06 NOTE — Telephone Encounter (Signed)
Called pt regarding the addition of his appt with Erasmo Downer next week. Left him a vm with the details and times for these appts.

## 2017-01-09 ENCOUNTER — Telehealth: Payer: Self-pay | Admitting: *Deleted

## 2017-01-09 NOTE — Telephone Encounter (Signed)
Oncology Nurse Navigator Documentation  Oncology Nurse Navigator Flowsheets 01/09/2017  Navigator Location [facility]  Navigator Encounter Type Telephone/per NP I called patient to re-schedule with Dr. Julien Nordmann. I was unable to reach but did leave a vm message to call me with my name and phone number.   Telephone Outgoing Call  Treatment Phase Pre-Tx/Tx Discussion  Barriers/Navigation Needs Coordination of Care  Interventions Coordination of Care  Coordination of Care Other  Acuity Level 2  Time Spent with Patient 30

## 2017-01-10 ENCOUNTER — Telehealth: Payer: Self-pay | Admitting: *Deleted

## 2017-01-10 NOTE — Telephone Encounter (Signed)
Oncology Nurse Navigator Documentation  Oncology Nurse Navigator Flowsheets 01/10/2017  Navigator Location [facility]  Navigator Encounter Type Telephone/I called to schedule Bradley Maldonado for an appt with Dr. Julien Nordmann. I was unable to reach and left vm message to call me with my name and phone number  Telephone Outgoing Call  Treatment Phase Pre-Tx/Tx Discussion  Barriers/Navigation Needs Coordination of Care  Interventions Coordination of Care  Coordination of Care Other  Acuity Level 1  Time Spent with Patient 15

## 2017-01-11 ENCOUNTER — Ambulatory Visit: Payer: Medicare Other | Admitting: Oncology

## 2017-01-12 ENCOUNTER — Telehealth: Payer: Self-pay | Admitting: *Deleted

## 2017-01-12 NOTE — Telephone Encounter (Signed)
Oncology Nurse Navigator Documentation  Oncology Nurse Navigator Flowsheets 01/12/2017  Navigator Location [facility]  Navigator Encounter Type Telephone/I called to follow up with Bradley Maldonado.  I was able to speak with him today.  I tried to set him up to be seen with Dr. Julien Nordmann but he has transportation issues.  I gave him some transportation options.  He states he will call me back about appt.  I gave him my name and phone number to call.   Telephone Outgoing Call  Treatment Phase Pre-Tx/Tx Discussion  Barriers/Navigation Needs Education  Education Other  Interventions Education  Education Method Verbal  Acuity Level 1  Time Spent with Patient 15

## 2017-01-16 ENCOUNTER — Telehealth: Payer: Self-pay | Admitting: Internal Medicine

## 2017-01-16 NOTE — Telephone Encounter (Signed)
Pt cld to reschedule appt. Appt has been rescheduled for the pt to see Dr. Julien Nordmann on 9/4 at 215pm. Pt aware to arrive 30 minutes early.

## 2017-01-24 ENCOUNTER — Encounter: Payer: Self-pay | Admitting: *Deleted

## 2017-01-24 ENCOUNTER — Other Ambulatory Visit (HOSPITAL_BASED_OUTPATIENT_CLINIC_OR_DEPARTMENT_OTHER): Payer: Medicare Other

## 2017-01-24 ENCOUNTER — Encounter: Payer: Self-pay | Admitting: Internal Medicine

## 2017-01-24 ENCOUNTER — Telehealth: Payer: Self-pay | Admitting: Internal Medicine

## 2017-01-24 ENCOUNTER — Ambulatory Visit (HOSPITAL_BASED_OUTPATIENT_CLINIC_OR_DEPARTMENT_OTHER): Payer: Medicare Other | Admitting: Internal Medicine

## 2017-01-24 VITALS — BP 160/78 | HR 81 | Temp 98.6°F | Resp 22 | Wt 158.8 lb

## 2017-01-24 DIAGNOSIS — C3491 Malignant neoplasm of unspecified part of right bronchus or lung: Secondary | ICD-10-CM

## 2017-01-24 DIAGNOSIS — J910 Malignant pleural effusion: Secondary | ICD-10-CM

## 2017-01-24 DIAGNOSIS — C3411 Malignant neoplasm of upper lobe, right bronchus or lung: Secondary | ICD-10-CM

## 2017-01-24 DIAGNOSIS — F1010 Alcohol abuse, uncomplicated: Secondary | ICD-10-CM | POA: Diagnosis not present

## 2017-01-24 DIAGNOSIS — Z7189 Other specified counseling: Secondary | ICD-10-CM | POA: Insufficient documentation

## 2017-01-24 DIAGNOSIS — J449 Chronic obstructive pulmonary disease, unspecified: Secondary | ICD-10-CM

## 2017-01-24 DIAGNOSIS — Z720 Tobacco use: Secondary | ICD-10-CM | POA: Diagnosis not present

## 2017-01-24 DIAGNOSIS — C3490 Malignant neoplasm of unspecified part of unspecified bronchus or lung: Secondary | ICD-10-CM

## 2017-01-24 HISTORY — DX: Other specified counseling: Z71.89

## 2017-01-24 HISTORY — DX: Malignant neoplasm of unspecified part of right bronchus or lung: C34.91

## 2017-01-24 LAB — CBC WITH DIFFERENTIAL/PLATELET
BASO%: 1.1 % (ref 0.0–2.0)
BASOS ABS: 0.1 10*3/uL (ref 0.0–0.1)
EOS ABS: 0.2 10*3/uL (ref 0.0–0.5)
EOS%: 3.1 % (ref 0.0–7.0)
HEMATOCRIT: 48.2 % (ref 38.4–49.9)
HEMOGLOBIN: 15.8 g/dL (ref 13.0–17.1)
LYMPH#: 3 10*3/uL (ref 0.9–3.3)
LYMPH%: 40.3 % (ref 14.0–49.0)
MCH: 29.5 pg (ref 27.2–33.4)
MCHC: 32.7 g/dL (ref 32.0–36.0)
MCV: 90.2 fL (ref 79.3–98.0)
MONO#: 0.7 10*3/uL (ref 0.1–0.9)
MONO%: 10.1 % (ref 0.0–14.0)
NEUT#: 3.4 10*3/uL (ref 1.5–6.5)
NEUT%: 45.4 % (ref 39.0–75.0)
PLATELETS: 199 10*3/uL (ref 140–400)
RBC: 5.35 10*6/uL (ref 4.20–5.82)
RDW: 15 % — AB (ref 11.0–14.6)
WBC: 7.4 10*3/uL (ref 4.0–10.3)

## 2017-01-24 LAB — COMPREHENSIVE METABOLIC PANEL
ALBUMIN: 3.2 g/dL — AB (ref 3.5–5.0)
ALK PHOS: 87 U/L (ref 40–150)
ALT: 16 U/L (ref 0–55)
ANION GAP: 9 meq/L (ref 3–11)
AST: 21 U/L (ref 5–34)
BUN: 10.7 mg/dL (ref 7.0–26.0)
CALCIUM: 9.7 mg/dL (ref 8.4–10.4)
CHLORIDE: 104 meq/L (ref 98–109)
CO2: 27 mEq/L (ref 22–29)
Creatinine: 1 mg/dL (ref 0.7–1.3)
EGFR: 87 mL/min/{1.73_m2} — AB (ref >90)
Glucose: 114 mg/dl (ref 70–140)
POTASSIUM: 4.2 meq/L (ref 3.5–5.1)
Sodium: 141 mEq/L (ref 136–145)
Total Bilirubin: 0.31 mg/dL (ref 0.20–1.20)
Total Protein: 7.1 g/dL (ref 6.4–8.3)

## 2017-01-24 NOTE — Progress Notes (Signed)
Oncology Nurse Navigator Documentation  Oncology Nurse Navigator Flowsheets 01/24/2017  [facility]igator Location [facility]  Navigator Encounter Type Other/per Dr. Julien Nordmann he ordered PET and referral for patient to be seen with T surgery.  I completed orders.  I called central scheduling for an appt for PET scan. I left vm message with my name and phone number to call.  I also contacted [facility] regarding referral.   Patient Visit Type MedOnc  Treatment Phase Pre-Tx/Tx Discussion  Barriers/Navigation Needs Coordination of Care  Interventions Coordination of Care  Coordination of Care Other  Acuity Level 2  Time Spent with Patient 30

## 2017-01-24 NOTE — Progress Notes (Signed)
[facility] Telephone:(336) [phone]   Fax:(336) [phone]  CONSULT NOTE  REFERRING PHYSICIAN: Dr. Murriel Hopper  REASON FOR CONSULTATION:  66 years old African-American male recently diagnosed with lung cancer.  HPI Bradley Maldonado is a 66 y.o. male with past medical history significant for right hip replacement as well as long history of smoking and alcohol abuse. The patient was complaining of worsening dyspnea for a week. He was seen at the emergency department and chest x-ray on 12/17/2016 showed large right pleural effusion. The patient underwent ultrasound-guided right thoracentesis with drainage of 1.2 L of pleural fluid and the final cytology was consistent with non-small cell lung cancer. CT scan of the chest was performed on 12/18/2016 and it showed the large right pleural effusion in addition to 2 spiculated nodules within the right upper lobe measuring up to 1.3 cm. There is also space consolidation in the right middle lobe. The scan also showed right hilar lymph node measuring 1.3 cm, no mediastinal or left hilar adenopathy. The patient underwent a second ultrasound-guided right thoracentesis on 12/19/2016 with drainage of 2.1 L of pleural fluid and the final cytology was consistent with non-small cell lung cancer consistent with adenocarcinoma. MRI of the brain was performed on 12/20/2016 and was negative for metastatic disease to the brain. The patient was referred to me today for evaluation and recommendation regarding treatment of his condition. He was given another appointment few days after his discharge from the hospital but he did not show up because of lack of transportation. When seen today he continues to complain of shortness breath at baseline and increased with exertion as well as cough productive of whitish sputum but no significant chest pain or hemoptysis. He denied having any recent weight loss or night sweats. He has no fever or chills. The  patient denied having any nausea, vomiting, diarrhea or constipation. He has no headache or visual changes. Family history significant for a father with lung cancer and prostate cancer. Mother had diabetes mellitus and questionable ovarian cancer. Sister had lung cancer. The patient is single and has 3 daughters. He was accompanied by his uncle by marriage. He used to work at [employer]. He has a history of smoking more than one pack per day for around 40 years he also has a history of alcohol abuse but no history of drug abuse.  HPI  History reviewed. No pertinent past medical history.  Past Surgical History:  Procedure Laterality Date  . Hip replacement    . IR THORACENTESIS ASP PLEURAL SPACE W/IMG GUIDE  12/19/2016    Family History  Problem Relation Age of Onset  . Diabetes Mother   . Lung cancer Father   . Cancer Father   . Cancer - Lung Father     Social History Social History  Substance Use Topics  . Smoking status: Current Every Day Smoker    Packs/day: 1.00    Types: Cigarettes  . Smokeless tobacco: Never Used  . Alcohol use 8.4 oz/week    14 Cans of beer per week     Comment: occasionally    No Known Allergies  Current Outpatient Prescriptions  Medication Sig Dispense Refill  . amLODipine (NORVASC) 5 MG tablet Take 1 tablet (5 mg total) by mouth daily. 30 tablet 3  . Omega-3 Fatty Acids (FISH OIL) 1000 MG CAPS Take 1,000 mg by mouth 2 (two) times a week.    Marland Kitchen guaiFENesin-dextromethorphan (ROBITUSSIN DM) 100-10 MG/5ML syrup Take 5 mLs by  mouth every 4 (four) hours as needed for cough. (Patient not taking: Reported on 01/24/2017) 118 mL 0  . SPIRIVA RESPIMAT 1.25 MCG/ACT AERS Inhale 2 puffs into the lungs daily. (Patient not taking: Reported on 01/24/2017) 1 Inhaler 1   No current facility-administered medications for this visit.     Review of Systems  Constitutional: positive for fatigue Eyes: negative Ears, nose, mouth, throat, and face: negative Respiratory:  positive for cough, dyspnea on exertion and sputum Cardiovascular: negative Gastrointestinal: negative Genitourinary:negative Integument/breast: negative Hematologic/lymphatic: negative Musculoskeletal:negative Neurological: negative Behavioral/Psych: negative Endocrine: negative Allergic/Immunologic: negative  Physical Exam  PXT:GGYIR, healthy, no distress, well nourished, well developed and anxious SKIN: skin color, texture, turgor are normal, no rashes or significant lesions HEAD: Normocephalic, No masses, lesions, tenderness or abnormalities EYES: normal, PERRLA, Conjunctiva are pink and non-injected EARS: External ears normal, Canals clear OROPHARYNX:no exudate, no erythema and lips, buccal mucosa, and tongue normal  NECK: supple, no adenopathy, no JVD LYMPH:  no palpable lymphadenopathy, no hepatosplenomegaly LUNGS: decreased breath sounds, expiratory wheezes bilaterally, scattered rhonchi bilaterally HEART: regular rate & rhythm, no murmurs and no gallops ABDOMEN:abdomen soft, non-tender, normal bowel sounds and no masses or organomegaly BACK: Back symmetric, no curvature., No CVA tenderness EXTREMITIES:no joint deformities, effusion, or inflammation, no edema  NEURO: alert & oriented x 3 with fluent speech, no focal motor/sensory deficits  PERFORMANCE STATUS: ECOG 1  LABORATORY DATA: Lab Results  Component Value Date   WBC 7.4 01/24/2017   HGB 15.8 01/24/2017   HCT 48.2 01/24/2017   MCV 90.2 01/24/2017   PLT 199 01/24/2017      Chemistry      Component Value Date/Time   NA 139 12/18/2016 0424   K 3.5 12/18/2016 0424   CL 103 12/18/2016 0424   CO2 28 12/18/2016 0424   BUN 6 12/18/2016 0424   CREATININE 0.84 12/18/2016 0424      Component Value Date/Time   CALCIUM 8.7 (L) 12/18/2016 0424   ALKPHOS 87 12/18/2016 0424   AST 52 (H) 12/18/2016 0424   ALT 35 12/18/2016 0424   BILITOT 1.0 12/18/2016 0424       RADIOGRAPHIC STUDIES: No results  found.  ASSESSMENT:This is a very pleasant 66 years old African-American male recently diagnosed with a stage IV (T3, N1, M1a) non-small cell lung cancer, adenocarcinoma diagnosed in July 2018 and presented with a right hilar lymphadenopathy and malignant right pleural effusion.  PLAN: I had a lengthy discussion with the patient and his family member today about his current disease is stage, prognosis and treatment options. I personally and independently reviewed the scan images and discuss results and showed the images to the patient today. I recommended for the patient to complete the staging workup by ordering a PET scan to rule out any other metastatic disease. I will also ask the pathology department to send his tissue block for molecular studies and PDL 1. I will also refer the patient to cardiothoracic surgery for consideration of Pleurx catheter placement for drainage of the recurrent malignant pleural effusion. I discussed with the patient his treatment options and goals of care. He understands that he has incurable condition and on the treatment will be off palliative nature. The option of treatment with palliative systemic chemotherapy versus palliative care and hospice referral versus treatment with targeted therapy or immunotherapy at the patient has the appropriate markers. [name] interested in treatment and would like to continue the molecular studies become available. I will see him back for follow-up  visit in 3 weeks for reevaluation and more detailed discussion of his treatment options based on the molecular studies. For smoking and alcohol abuse, I strongly encouraged the patient to quit and offered him to smoke cessation program. The patient was advised to call immediately if he has any concerning symptoms in the interval. The patient voices understanding of current disease status and treatment options and is in agreement with the current care plan.  All questions were answered.  The patient knows to call the clinic with any problems, questions or concerns. We can certainly see the patient much sooner if necessary.  Thank you so much for allowing me to participate in the care of Bradley Maldonado. I will continue to follow up the patient with you and assist in his care.  I spent 55 minutes counseling the patient face to face. The total time spent in the appointment was 80 minutes.  Disclaimer: This note was dictated with voice recognition software. Similar sounding words can inadvertently be transcribed and may not be corrected upon review.   Eilleen Kempf January 24, 2017, 3:00 PM

## 2017-01-24 NOTE — Telephone Encounter (Signed)
Scheduled appt per 9/4 los - Gave patient AVS and calender per los. Central radiology to contact patient with PET schedule.

## 2017-01-25 ENCOUNTER — Telehealth: Payer: Self-pay | Admitting: *Deleted

## 2017-01-25 NOTE — Telephone Encounter (Signed)
Oncology Nurse Navigator Documentation  Oncology Nurse Navigator Flowsheets 01/25/2017  Navigator Location [facility]  Navigator Encounter Type Telephone/Patient is not scheduled for PET scan. I called Bradley Maldonado to follow up. I was unable to reach him.  I did leave vm message with number to central scheduling to call and schedule. I also left my name and phone number to call if he needed help.  Telephone Outgoing Call  Treatment Phase Pre-Tx/Tx Discussion  Barriers/Navigation Needs Education  Interventions Education  Coordination of Care Other  Acuity Level 1  Time Spent with Patient 15

## 2017-01-26 ENCOUNTER — Encounter: Payer: Self-pay | Admitting: *Deleted

## 2017-01-26 NOTE — Progress Notes (Signed)
Oncology Nurse Navigator Documentation  Oncology Nurse Navigator Flowsheets 01/26/2017  Navigator Location [facility]  Navigator Encounter Type Other/I followed with pathology dept regarding foundation one and PDL 1 to be sent  Treatment Phase Pre-Tx/Tx Discussion  Barriers/Navigation Needs Coordination of Care  Interventions Coordination of Care  Acuity Level 1  Time Spent with Patient 15

## 2017-01-30 ENCOUNTER — Other Ambulatory Visit: Payer: Self-pay | Admitting: Cardiothoracic Surgery

## 2017-01-30 DIAGNOSIS — C3490 Malignant neoplasm of unspecified part of unspecified bronchus or lung: Secondary | ICD-10-CM

## 2017-01-31 ENCOUNTER — Ambulatory Visit
Admission: RE | Admit: 2017-01-31 | Discharge: 2017-01-31 | Disposition: A | Discharge status: Home / Self Care | Payer: Medicare Other | Source: Ambulatory Visit | Attending: Cardiothoracic Surgery | Admitting: Cardiothoracic Surgery

## 2017-01-31 ENCOUNTER — Other Ambulatory Visit: Payer: Self-pay | Admitting: *Deleted

## 2017-01-31 ENCOUNTER — Institutional Professional Consult (permissible substitution) (INDEPENDENT_AMBULATORY_CARE_PROVIDER_SITE_OTHER): Payer: Medicare Other | Admitting: Cardiothoracic Surgery

## 2017-01-31 VITALS — BP 150/85 | HR 92 | Resp 20 | Ht 71.0 in | Wt 158.0 lb

## 2017-01-31 DIAGNOSIS — C3411 Malignant neoplasm of upper lobe, right bronchus or lung: Secondary | ICD-10-CM | POA: Diagnosis not present

## 2017-01-31 DIAGNOSIS — C3490 Malignant neoplasm of unspecified part of unspecified bronchus or lung: Secondary | ICD-10-CM

## 2017-01-31 DIAGNOSIS — J90 Pleural effusion, not elsewhere classified: Secondary | ICD-10-CM

## 2017-01-31 NOTE — Progress Notes (Signed)
PCP is Patient, No Pcp Per Referring Provider is Curt Bears, MD  Chief Complaint  Patient presents with  . Pleural Effusion    eval for pleurX placement with CXR,    Patient examined, CT scan of chest and today's chest x-ray images personally reviewed and counseled with patient HPI: 66 year old AA male smoker recently diagnosed with advanced stage adenocarcinoma right lung. He presented to the hospital last month with progressive shortness of breath and was found have a large right pleural effusion. He underwent right thoracentesis 2 draining 1.2 and 2.0 L each. Fluid cytology showed adenocarcinoma. The patient was evaluated by Dr. Julien Nordmann for therapy. The molecular studies on the cell block from the pleural fluid is pending. While hospitalized the patient had a brain MRI which was negative for metastatic disease. He had a CT scan of the abdomen which was negative for metastatic disease. The CT scan of the chest shows a apical spiculated 2.5 cm mass, there is right hilar fullness and a smaller right upper lobe nodular density as well.  The patient is on home oxygen 2 L. His chest x-ray today shows recurrent large pleural effusion His oxygen saturation is 93% on 2 L. He has diminished breath sounds on the right side.  We discussed right Pleurx catheter placement, including the indications and expected benefits alternatives and risks. He agrees to have the catheter inserted. Because of the coming dangerous  weather conditions his surgery will be scheduled in 6 days on September 16 as an outpatient at [facility] hospital.  Past Medical History:  Diagnosis Date  . Adenocarcinoma of right lung, stage 4 ([place]) 01/24/2017  . Goals of care, counseling/discussion 01/24/2017    Past Surgical History:  Procedure Laterality Date  . Hip replacement    . IR THORACENTESIS ASP PLEURAL SPACE W/IMG GUIDE  12/19/2016    Family History  Problem Relation Age of Onset  . Diabetes Mother   . Lung cancer  Father   . Cancer Father   . Cancer - Lung Father     Social History Social History  Substance Use Topics  . Smoking status: Current Every Day Smoker    Packs/day: 1.00    Types: Cigarettes  . Smokeless tobacco: Never Used  . Alcohol use 8.4 oz/week    14 Cans of beer per week     Comment: occasionally    Current Outpatient Prescriptions  Medication Sig Dispense Refill  . amLODipine (NORVASC) 5 MG tablet Take 1 tablet (5 mg total) by mouth daily. 30 tablet 3  . Omega-3 Fatty Acids (FISH OIL) 1000 MG CAPS Take 1,000 mg by mouth 2 (two) times a week.    Marland Kitchen guaiFENesin-dextromethorphan (ROBITUSSIN DM) 100-10 MG/5ML syrup Take 5 mLs by mouth every 4 (four) hours as needed for cough. (Patient not taking: Reported on 01/31/2017) 118 mL 0  . SPIRIVA RESPIMAT 1.25 MCG/ACT AERS Inhale 2 puffs into the lungs daily. (Patient not taking: Reported on 01/31/2017) 1 Inhaler 1   No current facility-administered medications for this visit.     No Known Allergies  Review of Systems  Mild weight loss mild fatigue no fever Chronic cough, positive shortness of breath associated right pleural effusion No history of angina or heart failure palpitations arrhythmia murmur No abdominal pain or change in bowel habits No headache or change in vision No active dental problems or difficulty swallowing [name] right-hand dominant He has had a right hip replacement in the past for degenerative arthritis BP (!) 150/85  Pulse 92   Resp 20   Ht 5\' 11"  (1.803 m)   Wt 158 lb (71.7 kg)   BMI 22.04 kg/m oxygen saturation 93% on 2 L Physical Exam      Exam    General- alert and comfortable   Neck without JVD mass or adenopathy   Lungs- left lung clear, right breath sounds diminished   Cor- regular rate and rhythm, no murmur , gallop   Abdomen- soft, non-tender   Extremities - warm, non-tender, minimal edema   Neuro- oriented, appropriate, no focal weakness   Diagnostic Tests: Chest x-ray today shows  large right pleural effusion. Previous chest CT scan shows a spiculated right upper lobe mass, probable primary adenocarcinoma  Impression: Advanced stage adenocarcinoma right lung with recurrent symptoms and a right pleural effusion  Plan: Patient will be scheduled for right Pleurx catheter placement on September 16 to help manage his symptoms of shortness of breath. He understands that this procedure will not treat the lung cancer itself.  Len Childs, MD [facility] [phone]

## 2017-02-02 ENCOUNTER — Other Ambulatory Visit: Payer: Self-pay | Admitting: Oncology

## 2017-02-02 ENCOUNTER — Encounter (HOSPITAL_COMMUNITY): Payer: Self-pay | Admitting: *Deleted

## 2017-02-02 ENCOUNTER — Other Ambulatory Visit: Payer: Self-pay | Admitting: Internal Medicine

## 2017-02-02 DIAGNOSIS — J910 Malignant pleural effusion: Secondary | ICD-10-CM

## 2017-02-02 DIAGNOSIS — C3491 Malignant neoplasm of unspecified part of right bronchus or lung: Secondary | ICD-10-CM

## 2017-02-02 NOTE — Progress Notes (Unsigned)
Mr. Bradley Maldonado called this evening to report that he was more short of breath. This is been a slowly progressive problem not an acute exacerbation. He says that he is scheduled to have fluid tapped although I do not see that scheduled. He does have a PET scan scheduled for tomorrow.  He has adequate kidney function and electrolytes. I started him on Lasix 20 mg this evening and to repeat tomorrow morning. I asked him to call the office tomorrow morning to let us know if he needs further help.

## 2017-02-02 NOTE — Progress Notes (Signed)
Spoke with pt for pre-op call. Pt denies cardiac history or chest pain. Pt does have sob due to fluid in his right lung. Pt states he is not diabetic.

## 2017-02-03 ENCOUNTER — Telehealth: Payer: Self-pay | Admitting: *Deleted

## 2017-02-03 ENCOUNTER — Ambulatory Visit (HOSPITAL_COMMUNITY): Admission: RE | Admit: 2017-02-03 | Payer: Medicare Other | Source: Ambulatory Visit

## 2017-02-03 MED ORDER — FUROSEMIDE 20 MG PO TABS: ORAL_TABLET | ORAL | 0 refills | Status: DC

## 2017-02-03 NOTE — Telephone Encounter (Signed)
FYI Thoracentesis ordered by Dr. Julien Nordmann cancelled per patient request.   "Bradley Maldonado with Central scheduling calling for advice on Thoracentesis order.  Nothing today unless STAT order placed.  Can schedule at 10:00 am before procedure with Dr. Prescott Gum.  Spoke with patient to schedule but he is confused as to why this is orderred when he is scheduled Monday for insertion of a chest catheter."  Per Charlene RN, "The lung will be drained completely before the insertion of the PleurX drain.  He may need advance thoracentesis only if he's having trouble breathing at this time.  If no distress, no pre-procedure thoracentesis required or needed".  Patient denies feeling poorly or need for thoracentesis with this nurse call for further assessment.  Shared reason for call, shared surgeon will drain lung Monday before catheter placed.  However if you need fluid drained today this nurse will help make it happen today.  "Right now I feel I can wait until Monday.  I received a pill last night to help pull the fluid off.  Why didn't  I have this before?  Furosemide 20 mg from Dr. Jana Hakim.  Start last night, take twice a day today and then once a day as needed.  If I have them cut on me, will this hurt my situation, causing my cancer to spread because they opened me up?"    Message left for [facility] with Dr. Darcey Nora and patient's responses.  Updated medication list.  Answered questions about PleurX drain encouraging he ask home health nurse he expects to come every two days to empty PleurX.

## 2017-02-04 NOTE — Telephone Encounter (Signed)
OK to cancel. It was done based on his request.

## 2017-02-06 ENCOUNTER — Ambulatory Visit (HOSPITAL_COMMUNITY): Payer: Medicare Other

## 2017-02-06 ENCOUNTER — Ambulatory Visit (HOSPITAL_COMMUNITY)
Admission: RE | Admit: 2017-02-06 | Discharge: 2017-02-06 | Disposition: A | Discharge status: Home / Self Care | Payer: Medicare Other | Source: Ambulatory Visit | Attending: Cardiothoracic Surgery | Admitting: Cardiothoracic Surgery

## 2017-02-06 ENCOUNTER — Ambulatory Visit (HOSPITAL_COMMUNITY): Payer: Medicare Other | Admitting: Emergency Medicine

## 2017-02-06 ENCOUNTER — Encounter (HOSPITAL_COMMUNITY)
Admission: RE | Disposition: A | Discharge status: Home / Self Care | Payer: Self-pay | Source: Ambulatory Visit | Attending: Cardiothoracic Surgery

## 2017-02-06 DIAGNOSIS — J90 Pleural effusion, not elsewhere classified: Secondary | ICD-10-CM

## 2017-02-06 DIAGNOSIS — Z96649 Presence of unspecified artificial hip joint: Secondary | ICD-10-CM | POA: Insufficient documentation

## 2017-02-06 DIAGNOSIS — J910 Malignant pleural effusion: Secondary | ICD-10-CM | POA: Diagnosis not present

## 2017-02-06 DIAGNOSIS — Z79899 Other long term (current) drug therapy: Secondary | ICD-10-CM | POA: Diagnosis not present

## 2017-02-06 DIAGNOSIS — Z419 Encounter for procedure for purposes other than remedying health state, unspecified: Secondary | ICD-10-CM

## 2017-02-06 DIAGNOSIS — Z452 Encounter for adjustment and management of vascular access device: Secondary | ICD-10-CM | POA: Diagnosis not present

## 2017-02-06 DIAGNOSIS — Z801 Family history of malignant neoplasm of trachea, bronchus and lung: Secondary | ICD-10-CM | POA: Diagnosis not present

## 2017-02-06 DIAGNOSIS — I10 Essential (primary) hypertension: Secondary | ICD-10-CM | POA: Diagnosis not present

## 2017-02-06 DIAGNOSIS — C3491 Malignant neoplasm of unspecified part of right bronchus or lung: Secondary | ICD-10-CM | POA: Insufficient documentation

## 2017-02-06 DIAGNOSIS — J449 Chronic obstructive pulmonary disease, unspecified: Secondary | ICD-10-CM | POA: Insufficient documentation

## 2017-02-06 DIAGNOSIS — F17210 Nicotine dependence, cigarettes, uncomplicated: Secondary | ICD-10-CM | POA: Insufficient documentation

## 2017-02-06 DIAGNOSIS — J939 Pneumothorax, unspecified: Secondary | ICD-10-CM

## 2017-02-06 DIAGNOSIS — Z9889 Other specified postprocedural states: Secondary | ICD-10-CM | POA: Insufficient documentation

## 2017-02-06 DIAGNOSIS — Z833 Family history of diabetes mellitus: Secondary | ICD-10-CM | POA: Diagnosis not present

## 2017-02-06 DIAGNOSIS — C3490 Malignant neoplasm of unspecified part of unspecified bronchus or lung: Secondary | ICD-10-CM | POA: Diagnosis not present

## 2017-02-06 HISTORY — PX: CHEST TUBE INSERTION: SHX231

## 2017-02-06 HISTORY — DX: Essential (primary) hypertension: I10

## 2017-02-06 LAB — COMPREHENSIVE METABOLIC PANEL
ALT: 17 U/L (ref 17–63)
AST: 21 U/L (ref 15–41)
Albumin: 3.9 g/dL (ref 3.5–5.0)
Alkaline Phosphatase: 71 U/L (ref 38–126)
Anion gap: 10 (ref 5–15)
BUN: 12 mg/dL (ref 6–20)
CO2: 26 mmol/L (ref 22–32)
Calcium: 9.2 mg/dL (ref 8.9–10.3)
Chloride: 100 mmol/L — ABNORMAL LOW (ref 101–111)
Creatinine, Ser: 1.01 mg/dL (ref 0.61–1.24)
GFR calc Af Amer: >60 mL/min (ref >60)
GFR calc non Af Amer: >60 mL/min (ref >60)
Glucose, Bld: 87 mg/dL (ref 65–99)
Potassium: 3.8 mmol/L (ref 3.5–5.1)
Sodium: 136 mmol/L (ref 135–145)
Total Bilirubin: 0.8 mg/dL (ref 0.3–1.2)
Total Protein: 7.5 g/dL (ref 6.5–8.1)

## 2017-02-06 LAB — CBC
HCT: 51.6 % (ref 39.0–52.0)
Hemoglobin: 16.3 g/dL (ref 13.0–17.0)
MCH: 28.5 pg (ref 26.0–34.0)
MCHC: 31.6 g/dL (ref 30.0–36.0)
MCV: 90.4 fL (ref 78.0–100.0)
Platelets: 224 10*3/uL (ref 150–400)
RBC: 5.71 MIL/uL (ref 4.22–5.81)
RDW: 15.3 % (ref 11.5–15.5)
WBC: 7.6 10*3/uL (ref 4.0–10.5)

## 2017-02-06 LAB — APTT: aPTT: 31 seconds (ref 24–36)

## 2017-02-06 LAB — PROTIME-INR
INR: 1.05
Prothrombin Time: 13.6 seconds (ref 11.4–15.2)

## 2017-02-06 SURGERY — INSERTION, PLEURAL DRAINAGE CATHETER
Anesthesia: Monitor Anesthesia Care | Site: Chest | Laterality: Right

## 2017-02-06 MED ORDER — LACTATED RINGERS IV SOLN
INTRAVENOUS | Status: DC
  Administered 2017-02-06: 14:00:00 via INTRAVENOUS

## 2017-02-06 MED ORDER — ACETAMINOPHEN 650 MG RE SUPP: 650.0000 mg | RECTAL | Status: DC | PRN

## 2017-02-06 MED ORDER — FENTANYL CITRATE (PF) 250 MCG/5ML IJ SOLN
INTRAMUSCULAR | Status: AC
  Filled 2017-02-06: qty 5

## 2017-02-06 MED ORDER — MIDAZOLAM HCL 2 MG/2ML IJ SOLN
INTRAMUSCULAR | Status: DC | PRN
  Administered 2017-02-06 (×2): 1 mg via INTRAVENOUS

## 2017-02-06 MED ORDER — OXYCODONE HCL 5 MG PO TABS: 5.0000 mg | ORAL_TABLET | Freq: Once | ORAL | Status: DC | PRN

## 2017-02-06 MED ORDER — FENTANYL CITRATE (PF) 100 MCG/2ML IJ SOLN: 25.0000 ug | INTRAMUSCULAR | Status: DC | PRN

## 2017-02-06 MED ORDER — OXYCODONE HCL 5 MG/5ML PO SOLN: 5.0000 mg | Freq: Once | ORAL | Status: DC | PRN

## 2017-02-06 MED ORDER — SODIUM CHLORIDE 0.9% FLUSH: 3.0000 mL | INTRAVENOUS | Status: DC | PRN

## 2017-02-06 MED ORDER — PHENYLEPHRINE 40 MCG/ML (10ML) SYRINGE FOR IV PUSH (FOR BLOOD PRESSURE SUPPORT)
PREFILLED_SYRINGE | INTRAVENOUS | Status: DC | PRN
  Administered 2017-02-06 (×2): 80 ug via INTRAVENOUS
  Administered 2017-02-06: 120 ug via INTRAVENOUS
  Administered 2017-02-06 (×3): 80 ug via INTRAVENOUS

## 2017-02-06 MED ORDER — DEXMEDETOMIDINE HCL IN NACL 200 MCG/50ML IV SOLN
INTRAVENOUS | Status: DC | PRN
  Administered 2017-02-06 (×5): 8 ug via INTRAVENOUS

## 2017-02-06 MED ORDER — SODIUM CHLORIDE 0.9 % IV SOLN: 250.0000 mL | INTRAVENOUS | Status: DC | PRN

## 2017-02-06 MED ORDER — LIDOCAINE HCL 1 % IJ SOLN
INTRAMUSCULAR | Status: DC | PRN
  Administered 2017-02-06: 10 mL

## 2017-02-06 MED ORDER — HEPARIN SODIUM (PORCINE) 1000 UNIT/ML IJ SOLN
INTRAMUSCULAR | Status: AC
  Filled 2017-02-06: qty 1

## 2017-02-06 MED ORDER — PROPOFOL 10 MG/ML IV BOLUS
INTRAVENOUS | Status: AC
  Filled 2017-02-06: qty 20

## 2017-02-06 MED ORDER — ACETAMINOPHEN 500 MG PO TABS: 1000.0000 mg | ORAL_TABLET | Freq: Four times a day (QID) | ORAL | Status: AC

## 2017-02-06 MED ORDER — DEXTROSE 5 % IV SOLN
1.5000 g | INTRAVENOUS | Status: AC
  Administered 2017-02-06: 15:00:00 via INTRAVENOUS
  Administered 2017-02-06: 1.5 g via INTRAVENOUS
  Filled 2017-02-06: qty 1.5

## 2017-02-06 MED ORDER — OXYCODONE HCL 5 MG PO TABS: 5.0000 mg | ORAL_TABLET | ORAL | Status: DC | PRN

## 2017-02-06 MED ORDER — MIDAZOLAM HCL 2 MG/2ML IJ SOLN
INTRAMUSCULAR | Status: AC
  Filled 2017-02-06: qty 2

## 2017-02-06 MED ORDER — FENTANYL CITRATE (PF) 100 MCG/2ML IJ SOLN
INTRAMUSCULAR | Status: DC | PRN
  Administered 2017-02-06: 50 ug via INTRAVENOUS

## 2017-02-06 MED ORDER — PROTAMINE SULFATE 10 MG/ML IV SOLN
INTRAVENOUS | Status: AC
  Filled 2017-02-06: qty 5

## 2017-02-06 MED ORDER — SODIUM CHLORIDE 0.9% FLUSH: 3.0000 mL | Freq: Two times a day (BID) | INTRAVENOUS | Status: DC

## 2017-02-06 MED ORDER — PROTAMINE SULFATE 10 MG/ML IV SOLN
INTRAVENOUS | Status: AC
  Filled 2017-02-06: qty 25

## 2017-02-06 MED ORDER — ACETAMINOPHEN 325 MG PO TABS: 650.0000 mg | ORAL_TABLET | ORAL | Status: DC | PRN

## 2017-02-06 MED ORDER — ONDANSETRON HCL 4 MG/2ML IJ SOLN: 4.0000 mg | Freq: Once | INTRAMUSCULAR | Status: DC | PRN

## 2017-02-06 SURGICAL SUPPLY — 23 items
CANISTER SUCT 3000ML PPV (MISCELLANEOUS) ×3 IMPLANT
COVER SURGICAL LIGHT HANDLE (MISCELLANEOUS) ×3 IMPLANT
DERMABOND ADVANCED (GAUZE/BANDAGES/DRESSINGS) ×2
DERMABOND ADVANCED .7 DNX12 (GAUZE/BANDAGES/DRESSINGS) ×1 IMPLANT
DRAPE C-ARM 42X72 X-RAY (DRAPES) ×3 IMPLANT
DRAPE LAPAROSCOPIC ABDOMINAL (DRAPES) ×3 IMPLANT
DRSG OPSITE POSTOP 4X10 (GAUZE/BANDAGES/DRESSINGS) ×3 IMPLANT
GLOVE BIO SURGEON STRL SZ7.5 (GLOVE) ×6 IMPLANT
GOWN STRL REUS W/ TWL LRG LVL3 (GOWN DISPOSABLE) ×3 IMPLANT
GOWN STRL REUS W/TWL LRG LVL3 (GOWN DISPOSABLE) ×6
KIT BASIN OR (CUSTOM PROCEDURE TRAY) ×3 IMPLANT
KIT PLEURX DRAIN CATH 1000ML (MISCELLANEOUS) ×3 IMPLANT
KIT PLEURX DRAIN CATH 15.5FR (DRAIN) ×3 IMPLANT
KIT ROOM TURNOVER OR (KITS) ×3 IMPLANT
NEEDLE HYPO 25GX1X1/2 BEV (NEEDLE) ×3 IMPLANT
NS IRRIG 1000ML POUR BTL (IV SOLUTION) ×3 IMPLANT
PACK GENERAL/GYN (CUSTOM PROCEDURE TRAY) ×3 IMPLANT
PAD ARMBOARD 7.5X6 YLW CONV (MISCELLANEOUS) ×6 IMPLANT
SET DRAINAGE LINE (MISCELLANEOUS) IMPLANT
SUT VIC AB 3-0 SH 8-18 (SUTURE) ×3 IMPLANT
SYR CONTROL 10ML LL (SYRINGE) ×3 IMPLANT
VALVE REPLACEMENT CAP (MISCELLANEOUS) IMPLANT
WATER STERILE IRR 1000ML POUR (IV SOLUTION) ×3 IMPLANT

## 2017-02-06 NOTE — Discharge Planning (Signed)
[facility] consulted regarding DME oxygen.  Pt brought two empty oxygen tanks and does not have any at home.  [place] contacted Jermaine, [facility] liaison who advised pt family to take empty tanks to [facility] store [address] to have tanks switiched out for full ones.  EDCM relayed information to pt RN, Beth.   Malaky Tetrault J. Clydene Laming, [place], [place], [place]

## 2017-02-06 NOTE — Progress Notes (Signed)
After ambulating to bathroom, pt. Needed nasal O2 at 2 liters.

## 2017-02-06 NOTE — Progress Notes (Addendum)
ED CM consulted by PACU RN  concerning recommendations for [facility] services for management of Pleurx Catheter. Pt s/p Pleurx Cath insertion today by Dr. Prescott Gum.  CM spoke with patient regarding recommendation for [facility] service for cath management, patient is agreeable with recommendations.  Verified information with patient and family.  Offered choice, provided [facility] agency list verbally. Patient selected [facility] services.  [facility] Referral with start of care 02/08/17 faxed to [phone] Fax confirmation received. Informed patient  that  A nurse from [facility] will contact him at the number provided within 24 -48hr to set up a the initial  visit, he verbalized understanding teach back done. Patient made aware he will be discharged from the PACU with a case of Catheters. Pleurx Cath forms completed and faxed to [phone].  CM will follow up with patient tomorrow evening verify [facility] services.

## 2017-02-06 NOTE — Brief Op Note (Signed)
02/06/2017  4:49 PM  PATIENT:  Bradley Maldonado  66 y.o. male  PRE-OPERATIVE DIAGNOSIS:  MALIGNANT RIGHT PLEURAL EFFUSION  POST-OPERATIVE DIAGNOSIS:  MALIGNANT RIGHT PLEURAL EFFUSION  PROCEDURE:  Procedure(s): INSERTION PLEURAL DRAINAGE CATHETER (Right) Drainage 2.2 L bloody R pleural fluid  SURGEON:  Surgeon(s) and Role:    Ivin Poot, MD - Primary  PHYSICIAN ASSISTANT:   ASSISTANTS: none   ANESTHESIA:   local and IV sedation  EBL:  Total I/O In: 700 [I.V.:700] Out: 2005 [Other:2000; Blood:5]  BLOOD ADMINISTERED:none  DRAINS:Pleurx Catheter   LOCAL MEDICATIONS USED:  LIDOCAINE  and Amount: 10 ml  SPECIMEN:  No Specimen  DISPOSITION OF SPECIMEN:  N/A  COUNTS:  YES  TOURNIQUET:  * No tourniquets in log *  DICTATION: .Dragon Dictation  PLAN OF CARE: Discharge to home after PACU  PATIENT DISPOSITION:  PACU - hemodynamically stable.   Delay start of Pharmacological VTE agent (>24hrs) due to surgical blood loss or risk of bleeding: not applicable

## 2017-02-06 NOTE — Anesthesia Preprocedure Evaluation (Signed)
Anesthesia Evaluation  Patient identified by MRN, date of birth, ID band Patient awake    Reviewed: Allergy & Precautions, NPO status , Patient's Chart, lab work & pertinent test results  Airway Mallampati: II  TM Distance: >3 FB     Dental   Pulmonary Current Smoker,     + decreased breath sounds      Cardiovascular hypertension,  Rhythm:Regular Rate:Normal     Neuro/Psych    GI/Hepatic   Endo/Other    Renal/GU      Musculoskeletal   Abdominal   Peds  Hematology   Anesthesia Other Findings   Reproductive/Obstetrics                             Anesthesia Physical Anesthesia Plan  ASA: III  Anesthesia Plan: MAC   Post-op Pain Management:    Induction: Intravenous  PONV Risk Score and Plan: Ondansetron and Dexamethasone  Airway Management Planned: Natural Airway and Simple Face Mask  Additional Equipment:   Intra-op Plan:   Post-operative Plan:   Informed Consent: I have reviewed the patients History and Physical, chart, labs and discussed the procedure including the risks, benefits and alternatives for the proposed anesthesia with the patient or authorized representative who has indicated his/her understanding and acceptance.   Dental advisory given  Plan Discussed with: CRNA and Anesthesiologist  Anesthesia Plan Comments:         Anesthesia Quick Evaluation

## 2017-02-06 NOTE — Progress Notes (Signed)
Pre Procedure note for inpatients:   Bradley Maldonado has been scheduled for Procedure(s): INSERTION PLEURAL DRAINAGE CATHETER (Right) today. The various methods of treatment have been discussed with the patient. After consideration of the risks, benefits and treatment options the patient has consented to the planned procedure.   The patient has been seen and labs reviewed. There are no changes in the patient's condition to prevent proceeding with the planned procedure today.  Recent labs:  Lab Results  Component Value Date   WBC 7.6 02/06/2017   HGB 16.3 02/06/2017   HCT 51.6 02/06/2017   PLT 224 02/06/2017   GLUCOSE 87 02/06/2017   ALT 17 02/06/2017   AST 21 02/06/2017   NA 136 02/06/2017   K 3.8 02/06/2017   CL 100 (L) 02/06/2017   CREATININE 1.01 02/06/2017   BUN 12 02/06/2017   CO2 26 02/06/2017   INR 1.05 02/06/2017    Len Childs, MD 02/06/2017 2:08 PM

## 2017-02-06 NOTE — Anesthesia Postprocedure Evaluation (Signed)
Anesthesia Post Note  Patient: Bradley Maldonado  Procedure(s) Performed: Procedure(s) (LRB): INSERTION PLEURAL DRAINAGE CATHETER (Right)     Patient location during evaluation: PACU Anesthesia Type: MAC Level of consciousness: awake, awake and alert and oriented Pain management: pain level controlled Vital Signs Assessment: post-procedure vital signs reviewed and stable Respiratory status: nonlabored ventilation, spontaneous breathing and respiratory function stable Anesthetic complications: no    Last Vitals:  Vitals:   02/06/17 1715 02/06/17 1718  BP:  115/76  Pulse: 86 88  Resp: 17 20  Temp:    SpO2: (!) 89% 93%    Last Pain:  Vitals:   02/06/17 1253  TempSrc: Oral                 [name]

## 2017-02-06 NOTE — Discharge Instructions (Signed)
[facility] will drain catheter M-W-F Keep dressing dry until 9-19 then shower Resume usual meds and diet

## 2017-02-06 NOTE — Transfer of Care (Signed)
Immediate Anesthesia Transfer of Care Note  Patient: Bradley Maldonado  Procedure(s) Performed: Procedure(s): INSERTION PLEURAL DRAINAGE CATHETER (Right)  Patient Location: PACU  Anesthesia Type:MAC  Level of Consciousness: awake, alert , oriented and patient cooperative  Airway & Oxygen Therapy: Patient Spontanous Breathing and Patient connected to nasal cannula oxygen  Post-op Assessment: Report given to RN, Post -op Vital signs reviewed and stable and Patient moving all extremities X 4  Post vital signs: Reviewed and stable  Last Vitals:  Vitals:   02/06/17 1253 02/06/17 1340  BP: (!) 141/100 133/86  Pulse: 100   Resp: 18   Temp: 36.8 C   SpO2: 95%     Last Pain:  Vitals:   02/06/17 1253  TempSrc: Oral         Complications: No apparent anesthesia complications

## 2017-02-07 ENCOUNTER — Encounter (HOSPITAL_COMMUNITY): Payer: Self-pay | Admitting: Cardiothoracic Surgery

## 2017-02-07 NOTE — Op Note (Deleted)
  The note originally documented on this encounter has been moved the the encounter in which it belongs.  

## 2017-02-07 NOTE — Op Note (Signed)
NAME:  MAAN, ZARCONE NO.:  [id number]  MEDICAL RECORD NO.:  [id number]  LOCATION:                               FACILITY:  [facility]  PHYSICIAN:  Ivin Poot, M.D.  DATE OF BIRTH:  09/26/1950  DATE OF PROCEDURE:  02/06/2017 DATE OF DISCHARGE:                              OPERATIVE REPORT   OPERATION:  Placement of right PleurX catheter.  SURGEON:  Ivin Poot, M.D.  ANESTHESIA:  IV sedation by Anesthesia team and local 1% lidocaine.  PREOPERATIVE DIAGNOSES:  Recurrent malignant right pleural effusion, stage IV adenocarcinoma of the right lung.  POSTOPERATIVE DIAGNOSES:  Recurrent malignant right pleural effusion, stage IV adenocarcinoma of the right lung.  CLINICAL N7OTE:  The patient is a 66 year old gentleman I evaluated in the office for a recurrent large malignant pleural effusion, recent diagnosis of adenocarcinoma of the right lung.  I discussed the procedure in detail of right PleurX catheter including the location of the surgical incisions, the plan for home drainage with home health nursing and the long-term strategy to reduce the amount of pleural fluid accumulating in his chest and interfering with his comfort and breathing.  He understood the risks to include bleeding, infection, and recurrent effusion.  He gave his informed consent for this procedure.  DESCRIPTION OF PROCEDURE:  The patient was brought to the operating room and placed supine on the operating table.  The right side was bumped under a small soft blanket.  The right chest was prepped and draped as a sterile field.  A proper time-out was performed.  IV conscious sedation was given by the Anesthesia team.  Lidocaine 1% was infiltrated in 2 areas -- the anterior axillary line at the fifth interspace and the midclavicular line at the costal margin.  Two small incisions were made in each area.  Through the upper incision, a needle sheath was inserted and pleural fluid was  aspirated.  Through this, a guidewire was passed and the needle removed and the sheath left with the wire.  The wire was confirmed to be in the pleural space by C-arm fluoroscopy.  Next, the PleurX catheter was tunneled from the lower incision to the upper incision.  Next, the PleurX was inserted in the pleural space using the dilator sheath system over the guidewire.  After the PleurX catheter was in place, the sheath was removed and good placement was confirmed by C-arm fluoroscopy.  Next, the PleurX catheter was drained and drained 2.2 L of bloody fluid.  The upper incision was then closed in layers using interrupted subcutaneous Vicryl and interrupted nylon for the skin.  The lower incision was closed with a silk suture, used to secure the catheter to the skin.  After the drainage was complete, a sterile dressing was placed over both incisions and the catheter.  A chest x-ray taken in the operating room showed good drainage of the right pleural effusion with the catheter in good position and no pneumothorax.  The patient then returned to the recovery room.     Ivin Poot, M.D.   ______________________________ Ivin Poot, M.D.    PV/MEDQ  D:  02/06/2017  T:  02/07/2017  Job:  [id number]  cc:   Eilleen Kempf, M.D.

## 2017-02-08 DIAGNOSIS — C3491 Malignant neoplasm of unspecified part of right bronchus or lung: Secondary | ICD-10-CM | POA: Diagnosis not present

## 2017-02-08 DIAGNOSIS — J910 Malignant pleural effusion: Secondary | ICD-10-CM | POA: Diagnosis not present

## 2017-02-08 DIAGNOSIS — Z9981 Dependence on supplemental oxygen: Secondary | ICD-10-CM | POA: Diagnosis not present

## 2017-02-08 DIAGNOSIS — Z720 Tobacco use: Secondary | ICD-10-CM | POA: Diagnosis not present

## 2017-02-08 DIAGNOSIS — Z4682 Encounter for fitting and adjustment of non-vascular catheter: Secondary | ICD-10-CM | POA: Diagnosis not present

## 2017-02-09 ENCOUNTER — Encounter: Payer: Self-pay | Admitting: *Deleted

## 2017-02-09 NOTE — Progress Notes (Signed)
Oncology Nurse Navigator Documentation  Oncology Nurse Navigator Flowsheets 02/09/2017  Navigator Location [facility]  Navigator Encounter Type Other/I contacted [facility] one regarding results of molecular test.  I was told will be completed on 9/25.  I will updated Dr. Julien Nordmann.    Abnormal Finding Date 12/17/2016  Confirmed Diagnosis Date 12/19/2016  Treatment Phase Pre-Tx/Tx Discussion  Barriers/Navigation Needs Coordination of Care  Interventions Coordination of Care  Coordination of Care Other  Acuity Level 2  Time Spent with Patient 30

## 2017-02-10 ENCOUNTER — Telehealth: Payer: Self-pay | Admitting: *Deleted

## 2017-02-10 DIAGNOSIS — J910 Malignant pleural effusion: Secondary | ICD-10-CM | POA: Diagnosis not present

## 2017-02-10 DIAGNOSIS — Z4682 Encounter for fitting and adjustment of non-vascular catheter: Secondary | ICD-10-CM | POA: Diagnosis not present

## 2017-02-10 DIAGNOSIS — C3491 Malignant neoplasm of unspecified part of right bronchus or lung: Secondary | ICD-10-CM | POA: Diagnosis not present

## 2017-02-10 DIAGNOSIS — Z9981 Dependence on supplemental oxygen: Secondary | ICD-10-CM | POA: Diagnosis not present

## 2017-02-10 DIAGNOSIS — Z720 Tobacco use: Secondary | ICD-10-CM | POA: Diagnosis not present

## 2017-02-10 NOTE — Telephone Encounter (Signed)
Call received from [place] with [facility]- she stated request for order for home care social work consult for assessment for additional support.  This RN gave order per above as appropriate.  No further needs at this time.

## 2017-02-12 DIAGNOSIS — Z720 Tobacco use: Secondary | ICD-10-CM | POA: Diagnosis not present

## 2017-02-12 DIAGNOSIS — Z4682 Encounter for fitting and adjustment of non-vascular catheter: Secondary | ICD-10-CM | POA: Diagnosis not present

## 2017-02-12 DIAGNOSIS — C3491 Malignant neoplasm of unspecified part of right bronchus or lung: Secondary | ICD-10-CM | POA: Diagnosis not present

## 2017-02-12 DIAGNOSIS — J910 Malignant pleural effusion: Secondary | ICD-10-CM | POA: Diagnosis not present

## 2017-02-12 DIAGNOSIS — Z9981 Dependence on supplemental oxygen: Secondary | ICD-10-CM | POA: Diagnosis not present

## 2017-02-13 ENCOUNTER — Other Ambulatory Visit: Payer: Self-pay | Admitting: Medical Oncology

## 2017-02-13 DIAGNOSIS — C3491 Malignant neoplasm of unspecified part of right bronchus or lung: Secondary | ICD-10-CM

## 2017-02-13 LAB — ACID FAST CULTURE WITH REFLEXED SENSITIVITIES: ACID FAST CULTURE - AFSCU3: NEGATIVE

## 2017-02-14 ENCOUNTER — Ambulatory Visit: Payer: Medicare Other | Admitting: Internal Medicine

## 2017-02-14 ENCOUNTER — Encounter (HOSPITAL_COMMUNITY): Payer: Self-pay

## 2017-02-14 ENCOUNTER — Telehealth: Payer: Self-pay | Admitting: Medical Oncology

## 2017-02-14 ENCOUNTER — Telehealth: Payer: Self-pay | Admitting: Internal Medicine

## 2017-02-14 ENCOUNTER — Other Ambulatory Visit: Payer: Medicare Other

## 2017-02-14 ENCOUNTER — Encounter (HOSPITAL_COMMUNITY)
Admission: RE | Admit: 2017-02-14 | Discharge: 2017-02-14 | Disposition: A | Discharge status: Home / Self Care | Payer: Medicare Other | Source: Ambulatory Visit | Attending: Internal Medicine | Admitting: Internal Medicine

## 2017-02-14 DIAGNOSIS — C3490 Malignant neoplasm of unspecified part of unspecified bronchus or lung: Secondary | ICD-10-CM | POA: Insufficient documentation

## 2017-02-14 LAB — GLUCOSE, CAPILLARY: Glucose-Capillary: 104 mg/dL — ABNORMAL HIGH (ref 65–99)

## 2017-02-14 MED ORDER — FLUDEOXYGLUCOSE F - 18 (FDG) INJECTION
9.2000 | Freq: Once | INTRAVENOUS | Status: AC | PRN
  Administered 2017-02-14: 9.2 via INTRAVENOUS

## 2017-02-14 NOTE — Telephone Encounter (Signed)
Scheduled appt per 9/25 sch message - patient is aware.

## 2017-02-14 NOTE — Telephone Encounter (Signed)
Left message that pt missed appt today. Called [facility] and pt getting scan now. I told Hai Grabe. that scheduler will call back to r/s appt. Per Julien Nordmann schedule pt with [facility] tomorrow.

## 2017-02-15 ENCOUNTER — Encounter: Payer: Self-pay | Admitting: Oncology

## 2017-02-15 ENCOUNTER — Telehealth: Payer: Self-pay | Admitting: Internal Medicine

## 2017-02-15 ENCOUNTER — Other Ambulatory Visit (HOSPITAL_BASED_OUTPATIENT_CLINIC_OR_DEPARTMENT_OTHER): Payer: Medicare Other

## 2017-02-15 ENCOUNTER — Telehealth: Payer: Self-pay | Admitting: Oncology

## 2017-02-15 ENCOUNTER — Ambulatory Visit (HOSPITAL_BASED_OUTPATIENT_CLINIC_OR_DEPARTMENT_OTHER): Payer: Medicare Other | Admitting: Oncology

## 2017-02-15 ENCOUNTER — Other Ambulatory Visit: Payer: Self-pay | Admitting: Internal Medicine

## 2017-02-15 VITALS — BP 146/83 | HR 93 | Temp 98.6°F | Resp 16 | Ht 71.0 in | Wt 153.8 lb

## 2017-02-15 DIAGNOSIS — J910 Malignant pleural effusion: Secondary | ICD-10-CM | POA: Diagnosis not present

## 2017-02-15 DIAGNOSIS — C3491 Malignant neoplasm of unspecified part of right bronchus or lung: Secondary | ICD-10-CM | POA: Diagnosis not present

## 2017-02-15 DIAGNOSIS — Z7189 Other specified counseling: Secondary | ICD-10-CM

## 2017-02-15 DIAGNOSIS — C3411 Malignant neoplasm of upper lobe, right bronchus or lung: Secondary | ICD-10-CM

## 2017-02-15 DIAGNOSIS — I10 Essential (primary) hypertension: Secondary | ICD-10-CM

## 2017-02-15 DIAGNOSIS — Z5112 Encounter for antineoplastic immunotherapy: Secondary | ICD-10-CM | POA: Insufficient documentation

## 2017-02-15 LAB — COMPREHENSIVE METABOLIC PANEL
ALBUMIN: 3.2 g/dL — AB (ref 3.5–5.0)
ALK PHOS: 84 U/L (ref 40–150)
ALT: 19 U/L (ref 0–55)
ANION GAP: 9 meq/L (ref 3–11)
AST: 27 U/L (ref 5–34)
BUN: 11.9 mg/dL (ref 7.0–26.0)
CALCIUM: 9.6 mg/dL (ref 8.4–10.4)
CHLORIDE: 101 meq/L (ref 98–109)
CO2: 30 mEq/L — ABNORMAL HIGH (ref 22–29)
CREATININE: 1 mg/dL (ref 0.7–1.3)
EGFR: >90 mL/min/{1.73_m2} (ref >90)
Glucose: 127 mg/dl (ref 70–140)
Potassium: 4.2 mEq/L (ref 3.5–5.1)
Sodium: 140 mEq/L (ref 136–145)
Total Bilirubin: 0.49 mg/dL (ref 0.20–1.20)
Total Protein: 7.6 g/dL (ref 6.4–8.3)

## 2017-02-15 LAB — CBC WITH DIFFERENTIAL/PLATELET
BASO%: 0.4 % (ref 0.0–2.0)
Basophils Absolute: 0 10*3/uL (ref 0.0–0.1)
EOS%: 2.4 % (ref 0.0–7.0)
Eosinophils Absolute: 0.2 10*3/uL (ref 0.0–0.5)
HEMATOCRIT: 50.4 % — AB (ref 38.4–49.9)
HEMOGLOBIN: 16.6 g/dL (ref 13.0–17.1)
LYMPH#: 3 10*3/uL (ref 0.9–3.3)
LYMPH%: 44.5 % (ref 14.0–49.0)
MCH: 29.4 pg (ref 27.2–33.4)
MCHC: 33 g/dL (ref 32.0–36.0)
MCV: 88.9 fL (ref 79.3–98.0)
MONO#: 0.8 10*3/uL (ref 0.1–0.9)
MONO%: 11.7 % (ref 0.0–14.0)
NEUT#: 2.8 10*3/uL (ref 1.5–6.5)
NEUT%: 41 % (ref 39.0–75.0)
Platelets: 282 10*3/uL (ref 140–400)
RBC: 5.67 10*6/uL (ref 4.20–5.82)
RDW: 14.6 % (ref 11.0–14.6)
WBC: 6.7 10*3/uL (ref 4.0–10.3)

## 2017-02-15 NOTE — Progress Notes (Signed)
START ON PATHWAY REGIMEN - Non-Small Cell Lung     A cycle is 21 days:     Pembrolizumab   **Always confirm dose/schedule in your pharmacy ordering system**    Patient Characteristics: Stage IV Metastatic, Nonsquamous, Initial Chemotherapy/Immunotherapy, PS = 0, 1, PD-L1 Expression Positive  ? 50% (TPS) AJCC T Category: T3 Current Disease Status: Distant Metastases AJCC N Category: N3 AJCC M Category: M1a AJCC 8 Stage Grouping: IVA Histology: Nonsquamous Cell ROS1 Rearrangement Status: Negative T790M Mutation Status: Not Applicable - EGFR Mutation Negative/Unknown Other Mutations/Biomarkers: No Other Actionable Mutations PD-L1 Expression Status: PD-L1 Positive ? 50% (TPS) Chemotherapy/Immunotherapy LOT: Initial Chemotherapy/Immunotherapy Molecular Targeted Therapy: Not Appropriate ALK Translocation Status: Negative Would you be surprised if this patient died  in the next year<= I would NOT be surprised if this patient died in the next year EGFR Mutation Status: Negative/Wild Type BRAF V600E Mutation Status: Negative Performance Status: PS = 0, 1 Intent of Therapy: Non-Curative / Palliative Intent, Discussed with Patient

## 2017-02-15 NOTE — Telephone Encounter (Signed)
Scheduled appt per 9/26 los - per Marshall Cork - start on 10/11 - left message for patient to pick up new schedule next visit.

## 2017-02-15 NOTE — Telephone Encounter (Signed)
Scheduled chemo edu class per 9/26 los - unable to scheduled treatment due to capped day  - patient  Aware - msg sent to [facility] to see if treatment can be added. Gave patient avs and calender.

## 2017-02-15 NOTE — Progress Notes (Signed)
[facility] Follow up:    Curt Bears, MD [address] [place] [address]   DIAGNOSIS: Stage IV (T3, N1, M1a) non-small cell lung cancer, adenocarcinoma diagnosed in July 2018 and presented with a right hilar lymphadenopathy and malignant right pleural effusion.  Biomarker Findings Microsatellite Status - Cannot Be Determined Tumor Mutational Burden - Cannot Be Determined Genomic Findings For a complete list of the genes assayed, please refer to the Appendix. KRAS G12D TP53 R158P 7 Disease relevant genes with no reportable alterations: BRAF, RET, ROS1, EGFR, ALK, MET, ERBB2   PDL1 Expression:  50%.  SUMMARY OF ONCOLOGIC HISTORY:  No history exists.    CURRENT THERAPY: Keytruda 200 mg IV every 3 weeks. The patient is scheduled to begin treatment on 03/02/2017.  INTERVAL HISTORY: AZAVIER CRESON 66 y.o. male returns for routine follow-up with his uncle. Since his last visit he had a Pleurx catheter placed in his right chest due to a recurrent pleural effusion. Home health has been draining this every other day. Most recently they drained 300 mL of bloody drainage. The patient reports that the drainage is becoming less bloody. The patient denies fevers and chills. Denies chest pain, shortness of breath, hemoptysis. The patient reports an ongoing nonproductive cough. Denies nausea, vomiting, constipation, diarrhea. The patient had a recent PET scan and is here to discuss the results and to discuss treatment options.   Patient Active Problem List   Diagnosis Date Noted  . Encounter for antineoplastic immunotherapy 02/15/2017  . Adenocarcinoma of right lung, stage 4 ([place]) 01/24/2017  . Goals of care, counseling/discussion 01/24/2017  . Status post thoracentesis   . Chronic obstructive pulmonary disease ([place])   . Lung cancer ([facility]) 12/20/2016  . Pleural effusion, malignant 12/17/2016  . Hypertension 12/17/2016  . History of right hip  replacement 12/17/2016    has No Known Allergies.  MEDICAL HISTORY: Past Medical History:  Diagnosis Date  . Adenocarcinoma of right lung, stage 4 ([facility]) 01/24/2017  . Goals of care, counseling/discussion 01/24/2017  . Hypertension     SURGICAL HISTORY: Past Surgical History:  Procedure Laterality Date  . CHEST TUBE INSERTION Right 02/06/2017   Procedure: INSERTION PLEURAL DRAINAGE CATHETER;  Surgeon: Ivin Poot, MD;  Location: [facility];  Service: Thoracic;  Laterality: Right;  . EYE SURGERY Right    metal removed from eye  . Hip replacement    . IR THORACENTESIS ASP PLEURAL SPACE W/IMG GUIDE  12/19/2016    SOCIAL HISTORY: Social History   Social History  . Marital status: Divorced    Spouse name: N/A  . Number of children: N/A  . Years of education: N/A   Occupational History  . Not on file.   Social History Main Topics  . Smoking status: Current Every Day Smoker    Packs/day: 1.00    Types: Cigarettes  . Smokeless tobacco: Never Used     Comment: down to occasional cigarette  . Alcohol use 8.4 oz/week    14 Cans of beer per week     Comment: occasionally  now since cancer diagnosis  . Drug use: Yes    Types: Marijuana     Comment: none for 2-3 months as of 02/02/17  . Sexual activity: Not on file   Other Topics Concern  . Not on file   Social History Narrative   [facility] (12/19/16):   No pets currently. No mold exposure. No recent travel.    FAMILY HISTORY: Family History  Problem Relation Age of Onset  . Diabetes Mother   . Lung cancer Father   . Cancer Father   . Cancer - Lung Father     Review of Systems  Constitutional: Negative.   HENT:  Negative.   Eyes: Negative.   Respiratory: Positive for cough. Negative for hemoptysis and shortness of breath.   Cardiovascular: Negative.   Gastrointestinal: Negative.   Genitourinary: Negative.    Musculoskeletal: Negative.   Skin: Negative.   Neurological: Negative.   Hematological: Negative.    Psychiatric/Behavioral: Negative.       PHYSICAL EXAMINATION  ECOG PERFORMANCE STATUS: 1 - Symptomatic but completely ambulatory  Vitals:   02/15/17 1132  BP: (!) 146/83  Pulse: 93  Resp: 16  Temp: 98.6 F (37 C)  SpO2: 93%    Physical Exam  Constitutional: [name] oriented to person, place, and time and well-developed, well-nourished, and in no distress. No distress.  HENT:  Head: Normocephalic.  Mouth/Throat: Oropharynx is clear and moist. No oropharyngeal exudate.  Eyes: Conjunctivae are normal. Right eye exhibits no discharge. Left eye exhibits no discharge. No scleral icterus.  Neck: Normal range of motion. Neck supple.  Cardiovascular: Normal rate, regular rhythm, normal heart sounds and intact distal pulses.   Pulmonary/Chest: Effort normal and breath sounds normal. No respiratory distress. He has no wheezes.  Right chest Pleurx catheter with a dry dressing.  Abdominal: Soft. Bowel sounds are normal. He exhibits no distension and no mass. There is no tenderness.  Musculoskeletal: Normal range of motion. He exhibits no edema.  Lymphadenopathy:    He has no cervical adenopathy.  Neurological: [name] alert and oriented to person, place, and time. He exhibits normal muscle tone. Gait normal. Coordination normal.  Skin: Skin is warm and dry. No rash noted. [name] not diaphoretic. No erythema. No pallor.  Psychiatric: Mood, memory, affect and judgment normal.    LABORATORY DATA:  CBC    Component Value Date/Time   WBC 6.7 02/15/2017 1059   WBC 7.6 02/06/2017 1230   RBC 5.67 02/15/2017 1059   RBC 5.71 02/06/2017 1230   HGB 16.6 02/15/2017 1059   HCT 50.4 (H) 02/15/2017 1059   PLT 282 02/15/2017 1059   MCV 88.9 02/15/2017 1059   MCH 29.4 02/15/2017 1059   MCH 28.5 02/06/2017 1230   MCHC 33.0 02/15/2017 1059   MCHC 31.6 02/06/2017 1230   RDW 14.6 02/15/2017 1059   LYMPHSABS 3.0 02/15/2017 1059   MONOABS 0.8 02/15/2017 1059   EOSABS 0.2 02/15/2017 1059   BASOSABS  0.0 02/15/2017 1059    CMP     Component Value Date/Time   NA 140 02/15/2017 1059   K 4.2 02/15/2017 1059   CL 100 (L) 02/06/2017 1230   CO2 30 (H) 02/15/2017 1059   GLUCOSE 127 02/15/2017 1059   BUN 11.9 02/15/2017 1059   CREATININE 1.0 02/15/2017 1059   CALCIUM 9.6 02/15/2017 1059   PROT 7.6 02/15/2017 1059   ALBUMIN 3.2 (L) 02/15/2017 1059   AST 27 02/15/2017 1059   ALT 19 02/15/2017 1059   ALKPHOS 84 02/15/2017 1059   BILITOT 0.49 02/15/2017 1059   GFRNONAA >60 02/06/2017 1230   GFRAA >60 02/06/2017 1230    RADIOGRAPHIC STUDIES:  Nm Pet Image Initial (pi) Skull Base To Thigh  Result Date: 02/14/2017 CLINICAL DATA:  Initial treatment strategy for right lung cancer. EXAM: NUCLEAR MEDICINE PET SKULL BASE TO THIGH TECHNIQUE: 8.8 mCi F-18 FDG was injected intravenously. Full-ring PET imaging was  performed from the skull base to thigh after the radiotracer. CT data was obtained and used for attenuation correction and anatomic localization. FASTING BLOOD GLUCOSE:  Value: 104 mg/dl COMPARISON:  CT scan dated 12/18/2016 FINDINGS: NECK No hypermetabolic lymph nodes in the neck. CHEST Diffuse abnormal hypermetabolic activity along the right pleural surface. Index activity along the right posterior pleural space SUV 6.0. Right pleural drainage catheter in place. Confluent peripheral hypermetabolic nodules in the right upper lobe are observed along with hypermetabolic left lower lobe nodules. 1.1 by 1.7 cm right upper lobe nodule on image 31/8 has maximum SUV 8.5. Low-grade activity in the right lower paratracheal lymph node measuring 6 mm in thickness on image 73/4, maximum SUV 4.9, favoring malignancy. Moreover, there is a small left hilar lymph node, contralateral to the side of the bulb tumor, with maximum SUV 7.8 compatible with contralateral hilar adenopathy. Severe centrilobular emphysema. Moderate right pleural effusion with pleural thickening and accentuated activity. Coronary, aortic  arch, and branch vessel atherosclerotic vascular disease. ABDOMEN/PELVIS No abnormal hypermetabolic activity within the liver, pancreas, adrenal glands, or spleen. No hypermetabolic lymph nodes in the abdomen or pelvis. Aortoiliac atherosclerotic vascular disease. SKELETON No focal hypermetabolic activity to suggest skeletal metastasis. Right total hip prosthesis. Severe arthropathy left hip. Lower lumbar spondylosis and degenerative disc disease. IMPRESSION: 1. Malignant right pleural effusion, moderate in size, with hypermetabolic pulmonary nodules in the right lung, as well as thoracic adenopathy including a contralateral (left-sided) hypermetabolic hilar lymph node. Assuming non-small cell lung cancer this represents a T4 N3 M0 disease (stage IIIB). 2.  Aortic Atherosclerosis (ICD10-I70.0).  Coronary atherosclerosis. 3. Severe arthropathy of the left hip. Electronically Signed   By: Van Clines M.D.   On: 02/14/2017 14:58   ASSESSMENT and THERAPY PLAN:   Adenocarcinoma of right lung, stage 4 (HCC) This is a very pleasant 66 year old African-American male recently diagnosed with a stage IV (T3, N1, M1a) non-small cell lung cancer, adenocarcinoma diagnosed in July 2018 and presented with a right hilar lymphadenopathy and malignant right pleural effusion. Molecular studies were completed and there are no actionable mutations.PDL 1 is 50%.  The patient was seen with Dr. Julien Nordmann.PET scan results were discussed with the patient and his family member. Discussed with them that the patient does have stage IV disease due to the fact that he has a malignant right pleural effusion. Disease is currently confined to the chest. There was a lengthy discussion with the patient and his family member today about his current disease is stage, prognosis and treatment options.  He understands that he has incurable condition and on the treatment will be off palliative nature. The option of treatment with palliative  immunotherapy with or without systemic chemotherapy were discussed. The patient is interested in proceeding with treatment with immunotherapy.  Recommended Keytruda 200 mg IV every 3 weeks. Adverse effects of this treatment were discussed including but not limited to immune mediated the skin rash, diarrhea, inflammation of the lung, kidney, liver, thyroid or other endocrine dysfunction. The patient will have a chemotherapy education class within the next week. Plan to get treatment started within the next one to 2 weeks.  For the patient's cough, he may use Delsym over-the-counter.  Follow-up visit will be prior to cycle 2 of his immunotherapy.   Orders Placed This Encounter  Procedures  . CBC with Differential/Platelet    Standing Status:   Standing    Number of Occurrences:   20    Standing Expiration Date:  02/15/2018  . Comprehensive metabolic panel    Standing Status:   Standing    Number of Occurrences:   20    Standing Expiration Date:   02/15/2018  . TSH    Standing Status:   Standing    Number of Occurrences:   20    Standing Expiration Date:   02/15/2018    All questions were answered. The patient knows to call the clinic with any problems, questions or concerns. We can certainly see the patient much sooner if necessary.  Mikey Bussing, NP 02/15/2017   ADDENDUM: Hematology/Oncology Attending: I had a face to face encounter with the patient. I recommended his care plan. This is a very pleasant 66 years old African-American male with stage IV non-small cell lung cancer, adenocarcinoma with no actionable mutations and PDL 1 expression 50%. The patient had a PET scan performed recently that showed no extrathoracic disease. He has a malignant pleural effusion in addition to mediastinal and supraclavicular lymphadenopathy. His molecular study showed no actual mutations. I had a lengthy discussion with the patient and his brother today about his current disease stage, prognosis  and treatment options. I explained to the patient that he has incurable condition and on the treatment will be of palliative nature. I gave the patient the option of palliative care and hospice referral versus consideration of palliative treatment with systemic chemotherapy or single agent Ketruda (pembrolizumab). The patient is interested in the treatment with [place] (pembrolizumab). I discussed with him the adverse effect of this treatment including but not limited to immunotherapy mediated skin rash, diarrhea, inflammation of the lung, kidney, liver, thyroid or other endocrine dysfunction. [name] expected to start the first cycle of this treatment in less than 2 weeks. He would come back for follow-up visit at that time. The patient was advised to call immediately if he has any concerning symptoms in the interval.  Disclaimer: This note was dictated with voice recognition software. Similar sounding words can inadvertently be transcribed and may be missed upon review. Eilleen Kempf, MD 02/15/17

## 2017-02-15 NOTE — Assessment & Plan Note (Signed)
This is a very pleasant 66 year old African-American male recently diagnosed with a stage IV (T3, N1, M1a) non-small cell lung cancer, adenocarcinoma diagnosed in July 2018 and presented with a right hilar lymphadenopathy and malignant right pleural effusion. Molecular studies were completed and there are no actionable mutations.PDL 1 is 50%.  The patient was seen with Dr. Julien Nordmann.PET scan results were discussed with the patient and his family member. Discussed with them that the patient does have stage IV disease due to the fact that he has a malignant right pleural effusion. Disease is currently confined to the chest. There was a lengthy discussion with the patient and his family member today about his current disease is stage, prognosis and treatment options.  He understands that he has incurable condition and on the treatment will be off palliative nature. The option of treatment with palliative immunotherapy with or without systemic chemotherapy were discussed. The patient is interested in proceeding with treatment with immunotherapy.  Recommended Keytruda 200 mg IV every 3 weeks. Adverse effects of this treatment were discussed including but not limited to immune mediated the skin rash, diarrhea, inflammation of the lung, kidney, liver, thyroid or other endocrine dysfunction. The patient will have a chemotherapy education class within the next week. Plan to get treatment started within the next one to 2 weeks.  For the patient's cough, he may use Delsym over-the-counter.  Follow-up visit will be prior to cycle 2 of his immunotherapy.

## 2017-02-16 DIAGNOSIS — J910 Malignant pleural effusion: Secondary | ICD-10-CM | POA: Diagnosis not present

## 2017-02-16 DIAGNOSIS — Z9981 Dependence on supplemental oxygen: Secondary | ICD-10-CM | POA: Diagnosis not present

## 2017-02-16 DIAGNOSIS — Z4682 Encounter for fitting and adjustment of non-vascular catheter: Secondary | ICD-10-CM | POA: Diagnosis not present

## 2017-02-16 DIAGNOSIS — C3491 Malignant neoplasm of unspecified part of right bronchus or lung: Secondary | ICD-10-CM | POA: Diagnosis not present

## 2017-02-16 DIAGNOSIS — Z720 Tobacco use: Secondary | ICD-10-CM | POA: Diagnosis not present

## 2017-02-17 ENCOUNTER — Encounter (HOSPITAL_COMMUNITY): Payer: Self-pay

## 2017-02-20 DIAGNOSIS — Z720 Tobacco use: Secondary | ICD-10-CM | POA: Diagnosis not present

## 2017-02-20 DIAGNOSIS — J910 Malignant pleural effusion: Secondary | ICD-10-CM | POA: Diagnosis not present

## 2017-02-20 DIAGNOSIS — C3491 Malignant neoplasm of unspecified part of right bronchus or lung: Secondary | ICD-10-CM | POA: Diagnosis not present

## 2017-02-20 DIAGNOSIS — Z9981 Dependence on supplemental oxygen: Secondary | ICD-10-CM | POA: Diagnosis not present

## 2017-02-20 DIAGNOSIS — Z4682 Encounter for fitting and adjustment of non-vascular catheter: Secondary | ICD-10-CM | POA: Diagnosis not present

## 2017-02-21 ENCOUNTER — Encounter: Payer: Self-pay | Admitting: *Deleted

## 2017-02-21 ENCOUNTER — Other Ambulatory Visit: Payer: Medicare Other

## 2017-02-23 DIAGNOSIS — J910 Malignant pleural effusion: Secondary | ICD-10-CM | POA: Diagnosis not present

## 2017-02-23 DIAGNOSIS — Z720 Tobacco use: Secondary | ICD-10-CM | POA: Diagnosis not present

## 2017-02-23 DIAGNOSIS — Z9981 Dependence on supplemental oxygen: Secondary | ICD-10-CM | POA: Diagnosis not present

## 2017-02-23 DIAGNOSIS — Z4682 Encounter for fitting and adjustment of non-vascular catheter: Secondary | ICD-10-CM | POA: Diagnosis not present

## 2017-02-23 DIAGNOSIS — C3491 Malignant neoplasm of unspecified part of right bronchus or lung: Secondary | ICD-10-CM | POA: Diagnosis not present

## 2017-02-27 DIAGNOSIS — Z720 Tobacco use: Secondary | ICD-10-CM | POA: Diagnosis not present

## 2017-02-27 DIAGNOSIS — Z4682 Encounter for fitting and adjustment of non-vascular catheter: Secondary | ICD-10-CM | POA: Diagnosis not present

## 2017-02-27 DIAGNOSIS — C3491 Malignant neoplasm of unspecified part of right bronchus or lung: Secondary | ICD-10-CM | POA: Diagnosis not present

## 2017-02-27 DIAGNOSIS — Z9981 Dependence on supplemental oxygen: Secondary | ICD-10-CM | POA: Diagnosis not present

## 2017-02-27 DIAGNOSIS — J910 Malignant pleural effusion: Secondary | ICD-10-CM | POA: Diagnosis not present

## 2017-03-01 ENCOUNTER — Other Ambulatory Visit: Payer: Self-pay | Admitting: *Deleted

## 2017-03-01 ENCOUNTER — Encounter: Payer: Self-pay | Admitting: Cardiothoracic Surgery

## 2017-03-01 ENCOUNTER — Ambulatory Visit
Admission: RE | Admit: 2017-03-01 | Discharge: 2017-03-01 | Disposition: A | Discharge status: Home / Self Care | Payer: Medicare Other | Source: Ambulatory Visit | Attending: Cardiothoracic Surgery | Admitting: Cardiothoracic Surgery

## 2017-03-01 ENCOUNTER — Ambulatory Visit (INDEPENDENT_AMBULATORY_CARE_PROVIDER_SITE_OTHER): Payer: Medicare Other | Admitting: Cardiothoracic Surgery

## 2017-03-01 VITALS — BP 107/59 | HR 77 | Ht 71.0 in | Wt 153.0 lb

## 2017-03-01 DIAGNOSIS — J90 Pleural effusion, not elsewhere classified: Secondary | ICD-10-CM | POA: Diagnosis not present

## 2017-03-01 DIAGNOSIS — J910 Malignant pleural effusion: Secondary | ICD-10-CM | POA: Diagnosis not present

## 2017-03-01 NOTE — Progress Notes (Signed)
PCP is Curt Bears, MD Referring Provider is Curt Bears, MD  Chief Complaint  Patient presents with  . Follow-up    HPI: Right Pleurx catheter for malignant right pleural effusion Adenocarcinoma of the right lung Patient to start chemotherapy later this week Pleurx catheter placed a month ago initially with significant drainage now scant last 2 drainage sessions No associated shortness of breath Patient having some nonspecific right upper chest discomfort probably related to the malignancy Patient has not had a chest x-ray yet at this visit  Past Medical History:  Diagnosis Date  . Adenocarcinoma of right lung, stage 4 ([place]) 01/24/2017  . Goals of care, counseling/discussion 01/24/2017  . Hypertension     Past Surgical History:  Procedure Laterality Date  . CHEST TUBE INSERTION Right 02/06/2017   Procedure: INSERTION PLEURAL DRAINAGE CATHETER;  Surgeon: Ivin Poot, MD;  Location: [facility];  Service: Thoracic;  Laterality: Right;  . EYE SURGERY Right    metal removed from eye  . Hip replacement    . IR THORACENTESIS ASP PLEURAL SPACE W/IMG GUIDE  12/19/2016    Family History  Problem Relation Age of Onset  . Diabetes Mother   . Lung cancer Father   . Cancer Father   . Cancer - Lung Father     Social History Social History  Substance Use Topics  . Smoking status: Current Some Day Smoker    Packs/day: 1.00    Types: Cigarettes  . Smokeless tobacco: Never Used     Comment: down to occasional cigarette  . Alcohol use 8.4 oz/week    14 Cans of beer per week     Comment: occasionally  now since cancer diagnosis    Current Outpatient Prescriptions  Medication Sig Dispense Refill  . Omega-3 Fatty Acids (FISH OIL) 1000 MG CAPS Take 1,000 mg by mouth every other day. WITH LUNCH    . amLODipine (NORVASC) 5 MG tablet Take 1 tablet (5 mg total) by mouth daily. (Patient taking differently: Take 5 mg by mouth daily with lunch. ) 30 tablet 3   No current  facility-administered medications for this visit.     No Known Allergies  Review of Systems  Mild cough No weight loss Nonspecific right chest pain anteriorly with right lung  cancer-adenocarcinoma  BP (!) 107/59   Pulse 77   Ht 5\' 11"  (1.803 m)   Wt 153 lb (69.4 kg)   SpO2 94% Comment: was 80%, uses O2 as needed  BMI 21.34 kg/m  Physical Exam      Exam    General- alert and comfortable   Lungs- clear without rales, wheezes   Cor- regular rate and rhythm, no murmur , gallop   Abdomen- soft, non-tender   Extremities - warm, non-tender, minimal edema   Neuro- oriented, appropriate, no focal weakness   Diagnostic Tests: Chest x-ray pending  Impression: Right Pleurx catheter for malignant effusion Decreasing amount of drainage Chemotherapy apparently has not yet started We'll check x-ray to see if there is loculated component to fluid and follow drainage volumes with patient  Plan: Return with chest x-ray in 4 weeks. Drainage schedule with home health nurse reduced to once a week   Len Childs, MD [facility] [phone]

## 2017-03-02 ENCOUNTER — Other Ambulatory Visit (HOSPITAL_BASED_OUTPATIENT_CLINIC_OR_DEPARTMENT_OTHER): Payer: Medicare Other

## 2017-03-02 ENCOUNTER — Ambulatory Visit: Payer: Medicare Other | Admitting: Oncology

## 2017-03-02 ENCOUNTER — Telehealth: Payer: Self-pay

## 2017-03-02 ENCOUNTER — Other Ambulatory Visit: Payer: Medicare Other

## 2017-03-02 ENCOUNTER — Ambulatory Visit (HOSPITAL_BASED_OUTPATIENT_CLINIC_OR_DEPARTMENT_OTHER): Payer: Medicare Other

## 2017-03-02 VITALS — BP 140/67 | HR 83 | Temp 98.5°F | Resp 20

## 2017-03-02 DIAGNOSIS — J910 Malignant pleural effusion: Secondary | ICD-10-CM | POA: Diagnosis not present

## 2017-03-02 DIAGNOSIS — C3491 Malignant neoplasm of unspecified part of right bronchus or lung: Secondary | ICD-10-CM

## 2017-03-02 DIAGNOSIS — I10 Essential (primary) hypertension: Secondary | ICD-10-CM

## 2017-03-02 DIAGNOSIS — Z5112 Encounter for antineoplastic immunotherapy: Secondary | ICD-10-CM

## 2017-03-02 LAB — COMPREHENSIVE METABOLIC PANEL
ALT: 10 U/L (ref 0–55)
ANION GAP: 11 meq/L (ref 3–11)
AST: 17 U/L (ref 5–34)
Albumin: 3.2 g/dL — ABNORMAL LOW (ref 3.5–5.0)
Alkaline Phosphatase: 81 U/L (ref 40–150)
BUN: 15.5 mg/dL (ref 7.0–26.0)
CHLORIDE: 105 meq/L (ref 98–109)
CO2: 26 meq/L (ref 22–29)
CREATININE: 0.9 mg/dL (ref 0.7–1.3)
Calcium: 9.7 mg/dL (ref 8.4–10.4)
EGFR: >60 mL/min/{1.73_m2} (ref >60)
Glucose: 123 mg/dl (ref 70–140)
POTASSIUM: 4.4 meq/L (ref 3.5–5.1)
Sodium: 141 mEq/L (ref 136–145)
Total Bilirubin: 0.24 mg/dL (ref 0.20–1.20)
Total Protein: 7.7 g/dL (ref 6.4–8.3)

## 2017-03-02 LAB — CBC WITH DIFFERENTIAL/PLATELET
BASO%: 0.3 % (ref 0.0–2.0)
Basophils Absolute: 0 10*3/uL (ref 0.0–0.1)
EOS%: 2.3 % (ref 0.0–7.0)
Eosinophils Absolute: 0.1 10*3/uL (ref 0.0–0.5)
HCT: 50.7 % — ABNORMAL HIGH (ref 38.4–49.9)
HGB: 16.4 g/dL (ref 13.0–17.1)
LYMPH%: 43.3 % (ref 14.0–49.0)
MCH: 28.9 pg (ref 27.2–33.4)
MCHC: 32.4 g/dL (ref 32.0–36.0)
MCV: 89 fL (ref 79.3–98.0)
MONO#: 0.6 10*3/uL (ref 0.1–0.9)
MONO%: 9.5 % (ref 0.0–14.0)
NEUT#: 2.9 10*3/uL (ref 1.5–6.5)
NEUT%: 44.6 % (ref 39.0–75.0)
Platelets: 262 10*3/uL (ref 140–400)
RBC: 5.7 10*6/uL (ref 4.20–5.82)
RDW: 15.2 % — ABNORMAL HIGH (ref 11.0–14.6)
WBC: 6.4 10*3/uL (ref 4.0–10.3)
lymph#: 2.8 10*3/uL (ref 0.9–3.3)

## 2017-03-02 LAB — TSH: TSH: 1.9 m(IU)/L (ref 0.320–4.118)

## 2017-03-02 MED ORDER — SODIUM CHLORIDE 0.9 % IV SOLN
200.0000 mg | Freq: Once | INTRAVENOUS | Status: AC
  Administered 2017-03-02: 200 mg via INTRAVENOUS
  Filled 2017-03-02: qty 8

## 2017-03-02 MED ORDER — SODIUM CHLORIDE 0.9 % IV SOLN
Freq: Once | INTRAVENOUS | Status: AC
  Administered 2017-03-02: 15:00:00 via INTRAVENOUS

## 2017-03-02 NOTE — Patient Instructions (Signed)
Pembrolizumab injection  What is this medicine?  PEMBROLIZUMAB (pem broe liz ue mab) is a monoclonal antibody. It is used to treat melanoma, head and neck cancer, Hodgkin lymphoma, non-small cell lung cancer, urothelial cancer, stomach cancer, and cancers that have a certain genetic condition.  This medicine may be used for other purposes; ask your health care provider or pharmacist if you have questions.  COMMON BRAND NAME(S): Keytruda  What should I tell my health care provider before I take this medicine?  They need to know if you have any of these conditions:  -diabetes  -immune system problems  -inflammatory bowel disease  -liver disease  -lung or breathing disease  -lupus  -organ transplant  -an unusual or allergic reaction to pembrolizumab, other medicines, foods, dyes, or preservatives  -pregnant or trying to get pregnant  -breast-feeding  How should I use this medicine?  This medicine is for infusion into a vein. It is given by a health care professional in a hospital or clinic setting.  A special MedGuide will be given to you before each treatment. Be sure to read this information carefully each time.  Talk to your pediatrician regarding the use of this medicine in children. While this drug may be prescribed for selected conditions, precautions do apply.  Overdosage: If you think you have taken too much of this medicine contact a poison control center or emergency room at once.  NOTE: This medicine is only for you. Do not share this medicine with others.  What if I miss a dose?  It is important not to miss your dose. Call your doctor or health care professional if you are unable to keep an appointment.  What may interact with this medicine?  Interactions have not been studied.  Give your health care provider a list of all the medicines, herbs, non-prescription drugs, or dietary supplements you use. Also tell them if you smoke, drink alcohol, or use illegal drugs. Some items may interact with your  medicine.  This list may not describe all possible interactions. Give your health care provider a list of all the medicines, herbs, non-prescription drugs, or dietary supplements you use. Also tell them if you smoke, drink alcohol, or use illegal drugs. Some items may interact with your medicine.  What should I watch for while using this medicine?  Your condition will be monitored carefully while you are receiving this medicine.  You may need blood work done while you are taking this medicine.  Do not become pregnant while taking this medicine or for 4 months after stopping it. Women should inform their doctor if they wish to become pregnant or think they might be pregnant. There is a potential for serious side effects to an unborn child. Talk to your health care professional or pharmacist for more information. Do not breast-feed an infant while taking this medicine or for 4 months after the last dose.  What side effects may I notice from receiving this medicine?  Side effects that you should report to your doctor or health care professional as soon as possible:  -allergic reactions like skin rash, itching or hives, swelling of the face, lips, or tongue  -bloody or black, tarry  -breathing problems  -changes in vision  -chest pain  -chills  -constipation  -cough  -dizziness or feeling faint or lightheaded  -fast or irregular heartbeat  -fever  -flushing  -hair loss  -low blood counts - this medicine may decrease the number of white blood cells, red blood cells   and platelets. You may be at increased risk for infections and bleeding.  -muscle pain  -muscle weakness  -persistent headache  -signs and symptoms of high blood sugar such as dizziness; dry mouth; dry skin; fruity breath; nausea; stomach pain; increased hunger or thirst; increased urination  -signs and symptoms of kidney injury like trouble passing urine or change in the amount of urine  -signs and symptoms of liver injury like dark urine, light-colored  stools, loss of appetite, nausea, right upper belly pain, yellowing of the eyes or skin  -stomach pain  -sweating  -weight loss  Side effects that usually do not require medical attention (report to your doctor or health care professional if they continue or are bothersome):  -decreased appetite  -diarrhea  -tiredness  This list may not describe all possible side effects. Call your doctor for medical advice about side effects. You may report side effects to FDA at 1-800-FDA-1088.  Where should I keep my medicine?  This drug is given in a hospital or clinic and will not be stored at home.  NOTE: This sheet is a summary. It may not cover all possible information. If you have questions about this medicine, talk to your doctor, pharmacist, or health care provider.   2018 Elsevier/Gold Standard (2016-02-16 12:29:36)

## 2017-03-02 NOTE — Telephone Encounter (Signed)
Advised Neoma Laming regarding pleurx care for patient.  Advised him that patient only needs drained once a week per Dr Dr Prescott Gum.

## 2017-03-02 NOTE — Progress Notes (Signed)
Per Dr. Julien Nordmann proceed with treatment in regards to pleural effusion diagnosed on 03/01/2017

## 2017-03-03 ENCOUNTER — Encounter (HOSPITAL_COMMUNITY): Payer: Self-pay | Admitting: Cardiothoracic Surgery

## 2017-03-06 ENCOUNTER — Telehealth: Payer: Self-pay | Admitting: Medical Oncology

## 2017-03-06 DIAGNOSIS — Z4682 Encounter for fitting and adjustment of non-vascular catheter: Secondary | ICD-10-CM | POA: Diagnosis not present

## 2017-03-06 DIAGNOSIS — J910 Malignant pleural effusion: Secondary | ICD-10-CM | POA: Diagnosis not present

## 2017-03-06 DIAGNOSIS — Z9981 Dependence on supplemental oxygen: Secondary | ICD-10-CM | POA: Diagnosis not present

## 2017-03-06 DIAGNOSIS — Z720 Tobacco use: Secondary | ICD-10-CM | POA: Diagnosis not present

## 2017-03-06 DIAGNOSIS — C3491 Malignant neoplasm of unspecified part of right bronchus or lung: Secondary | ICD-10-CM | POA: Diagnosis not present

## 2017-03-06 NOTE — Telephone Encounter (Signed)
"  I am doing fine". [name] eating and drinking well. He asked me to mail his AVS because he left it here last Thursday . AVS Mailed .

## 2017-03-13 DIAGNOSIS — Z9981 Dependence on supplemental oxygen: Secondary | ICD-10-CM | POA: Diagnosis not present

## 2017-03-13 DIAGNOSIS — Z720 Tobacco use: Secondary | ICD-10-CM | POA: Diagnosis not present

## 2017-03-13 DIAGNOSIS — Z4682 Encounter for fitting and adjustment of non-vascular catheter: Secondary | ICD-10-CM | POA: Diagnosis not present

## 2017-03-13 DIAGNOSIS — J910 Malignant pleural effusion: Secondary | ICD-10-CM | POA: Diagnosis not present

## 2017-03-13 DIAGNOSIS — C3491 Malignant neoplasm of unspecified part of right bronchus or lung: Secondary | ICD-10-CM | POA: Diagnosis not present

## 2017-03-14 ENCOUNTER — Other Ambulatory Visit: Payer: Self-pay | Admitting: Cardiothoracic Surgery

## 2017-03-14 DIAGNOSIS — C3490 Malignant neoplasm of unspecified part of unspecified bronchus or lung: Secondary | ICD-10-CM

## 2017-03-15 ENCOUNTER — Ambulatory Visit (INDEPENDENT_AMBULATORY_CARE_PROVIDER_SITE_OTHER): Payer: Medicare Other | Admitting: Cardiothoracic Surgery

## 2017-03-15 ENCOUNTER — Other Ambulatory Visit: Payer: Self-pay

## 2017-03-15 ENCOUNTER — Ambulatory Visit
Admission: RE | Admit: 2017-03-15 | Discharge: 2017-03-15 | Disposition: A | Discharge status: Home / Self Care | Payer: Medicare Other | Source: Ambulatory Visit | Attending: Cardiothoracic Surgery | Admitting: Cardiothoracic Surgery

## 2017-03-15 VITALS — BP 104/70 | HR 47 | Ht 71.0 in | Wt 155.0 lb

## 2017-03-15 DIAGNOSIS — J90 Pleural effusion, not elsewhere classified: Secondary | ICD-10-CM

## 2017-03-15 DIAGNOSIS — Z9689 Presence of other specified functional implants: Secondary | ICD-10-CM

## 2017-03-15 DIAGNOSIS — C3491 Malignant neoplasm of unspecified part of right bronchus or lung: Secondary | ICD-10-CM | POA: Diagnosis not present

## 2017-03-15 DIAGNOSIS — J910 Malignant pleural effusion: Secondary | ICD-10-CM

## 2017-03-15 DIAGNOSIS — C3490 Malignant neoplasm of unspecified part of unspecified bronchus or lung: Secondary | ICD-10-CM

## 2017-03-15 NOTE — Progress Notes (Signed)
PCP is Curt Bears, MD Referring Provider is Curt Bears, MD  Chief Complaint  Patient presents with  . Follow-up    R pleurX, CXR no drainage in 3 weeks    HPI: Patient has return for scheduled visit of right Pleurx catheter for malignant pleural effusion. The catheter was placed approximately 8 weeks ago. Since starting chemotherapy the drainage has basically stopped. Chest x-ray today shows no significant reaccumulation of pleural effusion. There is a right hilar mass and some obstructive atelectasis. The catheter site is well-healed.  We will set the patient up for right Pleurx catheter removal at short stay outpatient treatment room for Tuesday, October 30-local anesthesia. The patient is instructed to eat a light breakfast that day.   Past Medical History:  Diagnosis Date  . Adenocarcinoma of right lung, stage 4 ([place]) 01/24/2017  . Goals of care, counseling/discussion 01/24/2017  . Hypertension     Past Surgical History:  Procedure Laterality Date  . CHEST TUBE INSERTION Right 02/06/2017   Procedure: INSERTION PLEURAL DRAINAGE CATHETER;  Surgeon: Ivin Poot, MD;  Location: [facility];  Service: Thoracic;  Laterality: Right;  . EYE SURGERY Right    metal removed from eye  . Hip replacement    . IR THORACENTESIS ASP PLEURAL SPACE W/IMG GUIDE  12/19/2016    Family History  Problem Relation Age of Onset  . Diabetes Mother   . Lung cancer Father   . Cancer Father   . Cancer - Lung Father     Social History Social History  Substance Use Topics  . Smoking status: Current Some Day Smoker    Packs/day: 1.00    Types: Cigarettes  . Smokeless tobacco: Never Used     Comment: down to occasional cigarette  . Alcohol use 8.4 oz/week    14 Cans of beer per week     Comment: occasionally  now since cancer diagnosis    Current Outpatient Prescriptions  Medication Sig Dispense Refill  . amLODipine (NORVASC) 5 MG tablet Take 1 tablet (5 mg total) by mouth daily. (Patient  taking differently: Take 5 mg by mouth daily with lunch. ) 30 tablet 3  . Omega-3 Fatty Acids (FISH OIL) 1000 MG CAPS Take 1,000 mg by mouth every other day. WITH LUNCH     No current facility-administered medications for this visit.     No Known Allergies  Review of Systems   \No fever or weight change No shortness of breath No drainage from the Pleurx catheter  BP 104/70   Pulse (!) 47   Ht 5\' 11"  (1.803 m)   Wt 155 lb (70.3 kg)   SpO2 92%   BMI 21.62 kg/m  Physical Exam      Exam  heart rate 74 with ectopy    General- alert and comfortable   Lungs- clear without rales, wheezes. Right Pleurx catheter site clean and dry   Cor-  no murmur , gallop   Abdomen- soft, non-tender   Extremities - warm, non-tender, minimal edema   Neuro- oriented, appropriate, no focal weakness   Diagnostic Tests: Chest x-ray today shows no recurrent right pleural effusion, Pleurx catheter in good position  Impression: Resolution of malignant right pleural effusion with chemotherapy and Pleurx catheter therapy. Catheter drainage no longer needed  Plan: Catheter will be removed at short stay [place] October 30 as  outpatient Len Childs, MD [facility] [phone]

## 2017-03-21 ENCOUNTER — Ambulatory Visit (HOSPITAL_COMMUNITY)
Admission: RE | Admit: 2017-03-21 | Discharge: 2017-03-21 | Disposition: A | Discharge status: Home / Self Care | Payer: Medicare Other | Source: Ambulatory Visit | Attending: Cardiothoracic Surgery | Admitting: Cardiothoracic Surgery

## 2017-03-21 ENCOUNTER — Encounter (HOSPITAL_COMMUNITY)
Admission: RE | Disposition: A | Discharge status: Home / Self Care | Payer: Self-pay | Source: Ambulatory Visit | Attending: Cardiothoracic Surgery

## 2017-03-21 DIAGNOSIS — J90 Pleural effusion, not elsewhere classified: Secondary | ICD-10-CM

## 2017-03-21 DIAGNOSIS — F17210 Nicotine dependence, cigarettes, uncomplicated: Secondary | ICD-10-CM | POA: Insufficient documentation

## 2017-03-21 DIAGNOSIS — Z96649 Presence of unspecified artificial hip joint: Secondary | ICD-10-CM | POA: Insufficient documentation

## 2017-03-21 DIAGNOSIS — Z9221 Personal history of antineoplastic chemotherapy: Secondary | ICD-10-CM | POA: Insufficient documentation

## 2017-03-21 DIAGNOSIS — J910 Malignant pleural effusion: Secondary | ICD-10-CM | POA: Insufficient documentation

## 2017-03-21 DIAGNOSIS — C3491 Malignant neoplasm of unspecified part of right bronchus or lung: Secondary | ICD-10-CM | POA: Insufficient documentation

## 2017-03-21 DIAGNOSIS — Z4682 Encounter for fitting and adjustment of non-vascular catheter: Secondary | ICD-10-CM | POA: Insufficient documentation

## 2017-03-21 DIAGNOSIS — Z79899 Other long term (current) drug therapy: Secondary | ICD-10-CM | POA: Diagnosis not present

## 2017-03-21 DIAGNOSIS — I10 Essential (primary) hypertension: Secondary | ICD-10-CM | POA: Insufficient documentation

## 2017-03-21 HISTORY — PX: REMOVAL OF PLEURAL DRAINAGE CATHETER: SHX5080

## 2017-03-21 SURGERY — REMOVAL, CLOSED DRAINAGE CATHETER SYSTEM, PLEURAL
Anesthesia: LOCAL | Laterality: Right

## 2017-03-21 MED ORDER — LIDOCAINE HCL (PF) 1 % IJ SOLN
INTRAMUSCULAR | Status: DC | PRN
  Administered 2017-03-21: 7 mL

## 2017-03-21 MED ORDER — LIDOCAINE HCL (PF) 1 % IJ SOLN
INTRAMUSCULAR | Status: AC
  Filled 2017-03-21: qty 30

## 2017-03-21 NOTE — Procedures (Signed)
Bradley Maldonado [id number] 10/10/1950   Pre Procedure: Recurrent Pleural Effusion on Right Post Procedure: Recurrent Pleural Effusion on Right   Procedure: Removal of Pleur-x Catheter   Procedure:  Time out was performed.  Patient was prepped and draped in sterile fashion.  Local anesthesia with 1% Xylocaine without epinephrine.  This was achieved with approximately 7 ml of xylocaine.  Once anesthesia was achieved blunt dissection of adhesions was performed off the cuff around the 5-6th interspace.  This was done without difficulty and the cuff was easily removed.  The incision was closed with 2 interruppted 3-0 Nylon sutures.  Wound was cleaned and dressed.  Blood Loss: Minimal  Dispo- patient discharged home, will follow up in our office in 1 week

## 2017-03-21 NOTE — Progress Notes (Signed)
Verbal instructions given by Junie Panning, PA for discharge care.

## 2017-03-22 ENCOUNTER — Encounter (HOSPITAL_COMMUNITY): Payer: Self-pay | Admitting: Cardiothoracic Surgery

## 2017-03-23 ENCOUNTER — Encounter: Payer: Self-pay | Admitting: Internal Medicine

## 2017-03-23 ENCOUNTER — Encounter: Payer: Self-pay | Admitting: *Deleted

## 2017-03-23 ENCOUNTER — Other Ambulatory Visit (HOSPITAL_BASED_OUTPATIENT_CLINIC_OR_DEPARTMENT_OTHER): Payer: Medicare Other

## 2017-03-23 ENCOUNTER — Ambulatory Visit (HOSPITAL_BASED_OUTPATIENT_CLINIC_OR_DEPARTMENT_OTHER): Payer: Medicare Other | Admitting: Internal Medicine

## 2017-03-23 ENCOUNTER — Ambulatory Visit (HOSPITAL_BASED_OUTPATIENT_CLINIC_OR_DEPARTMENT_OTHER): Payer: Medicare Other

## 2017-03-23 ENCOUNTER — Telehealth: Payer: Self-pay | Admitting: Internal Medicine

## 2017-03-23 VITALS — BP 128/65 | HR 83 | Temp 98.9°F | Resp 20 | Ht 71.0 in | Wt 153.7 lb

## 2017-03-23 DIAGNOSIS — Z5112 Encounter for antineoplastic immunotherapy: Secondary | ICD-10-CM | POA: Diagnosis not present

## 2017-03-23 DIAGNOSIS — C3491 Malignant neoplasm of unspecified part of right bronchus or lung: Secondary | ICD-10-CM

## 2017-03-23 DIAGNOSIS — J910 Malignant pleural effusion: Secondary | ICD-10-CM

## 2017-03-23 DIAGNOSIS — I10 Essential (primary) hypertension: Secondary | ICD-10-CM | POA: Diagnosis not present

## 2017-03-23 LAB — COMPREHENSIVE METABOLIC PANEL
ALT: 13 U/L (ref 0–55)
AST: 18 U/L (ref 5–34)
Albumin: 3.3 g/dL — ABNORMAL LOW (ref 3.5–5.0)
Alkaline Phosphatase: 92 U/L (ref 40–150)
Anion Gap: 11 mEq/L (ref 3–11)
BILIRUBIN TOTAL: 0.4 mg/dL (ref 0.20–1.20)
BUN: 9.9 mg/dL (ref 7.0–26.0)
CO2: 26 meq/L (ref 22–29)
CREATININE: 0.9 mg/dL (ref 0.7–1.3)
Calcium: 9.6 mg/dL (ref 8.4–10.4)
Chloride: 102 mEq/L (ref 98–109)
EGFR: >60 mL/min/{1.73_m2} (ref >60)
GLUCOSE: 132 mg/dL (ref 70–140)
Potassium: 4 mEq/L (ref 3.5–5.1)
SODIUM: 138 meq/L (ref 136–145)
TOTAL PROTEIN: 7.8 g/dL (ref 6.4–8.3)

## 2017-03-23 LAB — CBC WITH DIFFERENTIAL/PLATELET
BASO%: 0.5 % (ref 0.0–2.0)
Basophils Absolute: 0 10*3/uL (ref 0.0–0.1)
EOS%: 0.6 % (ref 0.0–7.0)
Eosinophils Absolute: 0 10*3/uL (ref 0.0–0.5)
HCT: 47.5 % (ref 38.4–49.9)
HGB: 15.5 g/dL (ref 13.0–17.1)
LYMPH%: 33.5 % (ref 14.0–49.0)
MCH: 28.3 pg (ref 27.2–33.4)
MCHC: 32.7 g/dL (ref 32.0–36.0)
MCV: 86.6 fL (ref 79.3–98.0)
MONO#: 0.4 10*3/uL (ref 0.1–0.9)
MONO%: 6.4 % (ref 0.0–14.0)
NEUT%: 59 % (ref 39.0–75.0)
NEUTROS ABS: 3.8 10*3/uL (ref 1.5–6.5)
Platelets: 273 10*3/uL (ref 140–400)
RBC: 5.49 10*6/uL (ref 4.20–5.82)
RDW: 15 % — ABNORMAL HIGH (ref 11.0–14.6)
WBC: 6.4 10*3/uL (ref 4.0–10.3)
lymph#: 2.1 10*3/uL (ref 0.9–3.3)

## 2017-03-23 LAB — TSH: TSH: 1.595 m(IU)/L (ref 0.320–4.118)

## 2017-03-23 MED ORDER — SODIUM CHLORIDE 0.9 % IV SOLN
Freq: Once | INTRAVENOUS | Status: AC
  Administered 2017-03-23: 10:00:00 via INTRAVENOUS

## 2017-03-23 MED ORDER — SODIUM CHLORIDE 0.9 % IV SOLN
200.0000 mg | Freq: Once | INTRAVENOUS | Status: AC
  Administered 2017-03-23: 200 mg via INTRAVENOUS
  Filled 2017-03-23: qty 8

## 2017-03-23 NOTE — Patient Instructions (Signed)
[facility] Discharge Instructions for Patients Receiving Chemotherapy  Today you received the following chemotherapy agents:  Keytruda (pembrolizumab)  To help prevent nausea and vomiting after your treatment, we encourage you to take your nausea medication as prescribed.   If you develop nausea and vomiting that is not controlled by your nausea medication, call the clinic.   BELOW ARE SYMPTOMS THAT SHOULD BE REPORTED IMMEDIATELY:  *FEVER GREATER THAN 100.5 F  *CHILLS WITH OR WITHOUT FEVER  NAUSEA AND VOMITING THAT IS NOT CONTROLLED WITH YOUR NAUSEA MEDICATION  *UNUSUAL SHORTNESS OF BREATH  *UNUSUAL BRUISING OR BLEEDING  TENDERNESS IN MOUTH AND THROAT WITH OR WITHOUT PRESENCE OF ULCERS  *URINARY PROBLEMS  *BOWEL PROBLEMS  UNUSUAL RASH Items with * indicate a potential emergency and should be followed up as soon as possible.  Feel free to call the clinic should you have any questions or concerns. The clinic phone number is [phone].  Please show the [facility] at check-in to the Emergency Department and triage nurse.

## 2017-03-23 NOTE — Progress Notes (Signed)
[facility] Telephone:([phone]) [phone]   Fax:([phone]) [place], MD [place] [address]  DIAGNOSIS: Stage IV (T3, N1, M1a) non-small cell lung cancer, adenocarcinoma diagnosed in July 2018 and presented with a right hilar lymphadenopathy and malignant right pleural effusion diagnosed in July 2018. Molecular studies were negative for EGFR, ALK, ROS 1 and BRAF mutations. PDL 1 expression was 50%  PRIOR THERAPY: None  CURRENT THERAPY: Ketruda (pembrolizumab) 200 mg IV every 3 weeks, status post 1 cycle.  INTERVAL HISTORY: Bradley Maldonado 66 y.o. male returns to the clinic today for follow-up visit accompanied by his brother.  The patient is feeling fine today with no specific complaints.  He had a Pleurx catheter for drainage of the right pleural effusion that was recently removed by Dr. Prescott Gum.  He denied having any significant chest pain but has shortness of breath with exertion with no cough or hemoptysis.  He denied having any fever or chills.  He has no nausea, vomiting, diarrhea or constipation.  He tolerated the first cycle of his treatment with immunotherapy fairly well.  [name] here today for evaluation before starting cycle #2.  MEDICAL HISTORY: Past Medical History:  Diagnosis Date  . Adenocarcinoma of right lung, stage 4 ([place]) 01/24/2017  . Goals of care, counseling/discussion 01/24/2017  . Hypertension     ALLERGIES:  has No Known Allergies.  MEDICATIONS:  Current Outpatient Prescriptions  Medication Sig Dispense Refill  . amLODipine (NORVASC) 5 MG tablet Take 1 tablet (5 mg total) by mouth daily. (Patient taking differently: Take 5 mg by mouth daily with lunch. ) 30 tablet 3  . guaiFENesin (ROBITUSSIN) 100 MG/5ML SOLN Take 10 mLs by mouth at bedtime as needed for cough or to loosen phlegm.    . Omega-3 Fatty Acids (FISH OIL) 1200 MG CAPS Take 1,200 mg by mouth every other day.     No  current facility-administered medications for this visit.     SURGICAL HISTORY:  Past Surgical History:  Procedure Laterality Date  . CHEST TUBE INSERTION Right 02/06/2017   Procedure: INSERTION PLEURAL DRAINAGE CATHETER;  Surgeon: Ivin Poot, MD;  Location: [facility];  Service: Thoracic;  Laterality: Right;  . EYE SURGERY Right    metal removed from eye  . Hip replacement    . IR THORACENTESIS ASP PLEURAL SPACE W/IMG GUIDE  12/19/2016  . REMOVAL OF PLEURAL DRAINAGE CATHETER Right 03/21/2017   Procedure: REMOVAL OF RIGHT PLEURAL DRAINAGE CATHETER;  Surgeon: Ivin Poot, MD;  Location: [facility];  Service: Thoracic;  Laterality: Right;    REVIEW OF SYSTEMS:  A comprehensive review of systems was negative except for: Constitutional: positive for fatigue Respiratory: positive for dyspnea on exertion   PHYSICAL EXAMINATION: General appearance: alert, cooperative, fatigued and no distress Head: Normocephalic, without obvious abnormality, atraumatic Neck: no adenopathy, no JVD, supple, symmetrical, trachea midline and thyroid not enlarged, symmetric, no tenderness/mass/nodules Lymph nodes: Cervical, supraclavicular, and axillary nodes normal. Resp: clear to auscultation bilaterally Back: symmetric, no curvature. ROM normal. No CVA tenderness. Cardio: regular rate and rhythm, S1, S2 normal, no murmur, click, rub or gallop GI: soft, non-tender; bowel sounds normal; no masses,  no organomegaly Extremities: extremities normal, atraumatic, no cyanosis or edema  ECOG PERFORMANCE STATUS: 1 - Symptomatic but completely ambulatory  Blood pressure 128/65, pulse 83, temperature 98.9 F (37.2 C), temperature source Oral, resp. rate 20, height _0  (1.803 m),  weight 153 lb 11.2 oz (69.7 kg), SpO2 93 %.  LABORATORY DATA: Lab Results  Component Value Date   WBC 6.4 03/23/2017   HGB 15.5 03/23/2017   HCT 47.5 03/23/2017   MCV 86.6 03/23/2017   PLT 273 03/23/2017      Chemistry        Component Value Date/Time   NA 138 03/23/2017 0814   K 4.0 03/23/2017 0814   CL 100 (L) 02/06/2017 1230   CO2 26 03/23/2017 0814   BUN 9.9 03/23/2017 0814   CREATININE 0.9 03/23/2017 0814      Component Value Date/Time   CALCIUM 9.6 03/23/2017 0814   ALKPHOS 92 03/23/2017 0814   AST 18 03/23/2017 0814   ALT 13 03/23/2017 0814   BILITOT 0.40 03/23/2017 0814       RADIOGRAPHIC STUDIES: Dg Chest 2 View  Result Date: 03/15/2017 CLINICAL DATA:  Right lung cancer EXAM: CHEST  2 VIEW COMPARISON:  03/01/2017 FINDINGS: Persistent right upper lobe mass. Interstitial thickening of the right lung. Small right pleural effusion. Right-sided chest tube in satisfactory position. No pneumothorax. Left lung is clear. Stable cardiomediastinal silhouette. No acute osseous abnormality. IMPRESSION: 1. No significant interval change compared with 03/01/2017. 2. Stable persistent right upper lobe mass most consistent with lung malignancy. Stable small right pleural effusion. Electronically Signed   By: Kathreen Devoid   On: 03/15/2017 11:03   Dg Chest 2 View  Result Date: 03/01/2017 CLINICAL DATA:  Right pleural drainage catheter EXAM: CHEST  2 VIEW COMPARISON:  02/06/2017 FINDINGS: Right pleural catheter in unchanged position. Small right pleural effusion. Improved right lung aeration. No left pleural effusion. No pneumothorax. Right midlung airspace opacity similar in appearance to recent chest CT 02/14/2017. Stable cardiomediastinal silhouette. No acute osseous abnormality. IMPRESSION: Right pleural catheter in unchanged position. Small right pleural effusion. Improved right lung aeration. Electronically Signed   By: Kathreen Devoid   On: 03/01/2017 13:43    ASSESSMENT AND PLAN: This is a very pleasant 66 years old African-American male with a stage IV non-small cell lung cancer, adenocarcinoma with PDL 1 expression of 50% and negative for actionable mutations. The patient is currently undergoing treatment  with immunotherapy with Keytruda 200 mg IV every 3 weeks status post 1 cycle. He tolerated the first cycle of his treatment fairly well with no significant adverse effects. I recommended for the patient to proceed with cycle #2 today as a schedule. I will see him back for follow-up visit in 3 weeks for evaluation before starting cycle #3. The patient was advised to call immediately if he has any concerning symptoms in the interval. The patient voices understanding of current disease status and treatment options and is in agreement with the current care plan.  All questions were answered. The patient knows to call the clinic with any problems, questions or concerns. We can certainly see the patient much sooner if necessary.  I spent 10 minutes counseling the patient face to face. The total time spent in the appointment was 15 minutes.  Disclaimer: This note was dictated with voice recognition software. Similar sounding words can inadvertently be transcribed and may not be corrected upon review.

## 2017-03-23 NOTE — Telephone Encounter (Signed)
Scheduled appt per 11/1 los - patient to get an updated schedule in the treatment area.

## 2017-03-23 NOTE — Patient Instructions (Signed)
Steps to Quit Smoking Smoking tobacco can be bad for your health. It can also affect almost every organ in your body. Smoking puts you and people around you at risk for many serious long-lasting (chronic) diseases. Quitting smoking is hard, but it is one of the best things that you can do for your health. It is never too late to quit. What are the benefits of quitting smoking? When you quit smoking, you lower your risk for getting serious diseases and conditions. They can include:  Lung cancer or lung disease.  Heart disease.  Stroke.  Heart attack.  Not being able to have children (infertility).  Weak bones (osteoporosis) and broken bones (fractures).  If you have coughing, wheezing, and shortness of breath, those symptoms may get better when you quit. You may also get sick less often. If you are pregnant, quitting smoking can help to lower your chances of having a baby of low birth weight. What can I do to help me quit smoking? Talk with your doctor about what can help you quit smoking. Some things you can do (strategies) include:  Quitting smoking totally, instead of slowly cutting back how much you smoke over a period of time.  Going to in-person counseling. You are more likely to quit if you go to many counseling sessions.  Using resources and support systems, such as: ? Online chats with a counselor. ? Phone quitlines. ? Printed self-help materials. ? Support groups or group counseling. ? Text messaging programs. ? Mobile phone apps or applications.  Taking medicines. Some of these medicines may have nicotine in them. If you are pregnant or breastfeeding, do not take any medicines to quit smoking unless your doctor says it is okay. Talk with your doctor about counseling or other things that can help you.  Talk with your doctor about using more than one strategy at the same time, such as taking medicines while you are also going to in-person counseling. This can help make  quitting easier. What things can I do to make it easier to quit? Quitting smoking might feel very hard at first, but there is a lot that you can do to make it easier. Take these steps:  Talk to your family and friends. Ask them to support and encourage you.  Call phone quitlines, reach out to support groups, or work with a counselor.  Ask people who smoke to not smoke around you.  Avoid places that make you want (trigger) to smoke, such as: ? Bars. ? Parties. ? Smoke-break areas at work.  Spend time with people who do not smoke.  Lower the stress in your life. Stress can make you want to smoke. Try these things to help your stress: ? Getting regular exercise. ? Deep-breathing exercises. ? Yoga. ? Meditating. ? Doing a body scan. To do this, close your eyes, focus on one area of your body at a time from head to toe, and notice which parts of your body are tense. Try to relax the muscles in those areas.  Download or buy apps on your mobile phone or tablet that can help you stick to your quit plan. There are many free apps, such as QuitGuide from the CDC (Centers for Disease Control and Prevention). You can find more support from smokefree.gov and other websites.  This information is not intended to replace advice given to you by your health care provider. Make sure you discuss any questions you have with your health care provider. Document Released: 03/05/2009 Document   Revised: 01/05/2016 Document Reviewed: 09/23/2014 Elsevier Interactive Patient Education  2018 Elsevier Inc.  

## 2017-04-03 ENCOUNTER — Other Ambulatory Visit: Payer: Self-pay | Admitting: Cardiothoracic Surgery

## 2017-04-03 DIAGNOSIS — C3490 Malignant neoplasm of unspecified part of unspecified bronchus or lung: Secondary | ICD-10-CM

## 2017-04-05 ENCOUNTER — Encounter: Payer: Medicare Other | Admitting: Cardiothoracic Surgery

## 2017-04-12 ENCOUNTER — Ambulatory Visit (HOSPITAL_BASED_OUTPATIENT_CLINIC_OR_DEPARTMENT_OTHER): Payer: Medicare Other | Admitting: Nurse Practitioner

## 2017-04-12 ENCOUNTER — Encounter: Payer: Self-pay | Admitting: Nurse Practitioner

## 2017-04-12 ENCOUNTER — Other Ambulatory Visit (HOSPITAL_BASED_OUTPATIENT_CLINIC_OR_DEPARTMENT_OTHER): Payer: Medicare Other

## 2017-04-12 ENCOUNTER — Ambulatory Visit (HOSPITAL_BASED_OUTPATIENT_CLINIC_OR_DEPARTMENT_OTHER): Payer: Medicare Other

## 2017-04-12 VITALS — BP 137/87 | HR 102 | Temp 98.7°F | Resp 17 | Ht 71.0 in | Wt 153.7 lb

## 2017-04-12 DIAGNOSIS — Z5112 Encounter for antineoplastic immunotherapy: Secondary | ICD-10-CM

## 2017-04-12 DIAGNOSIS — C3411 Malignant neoplasm of upper lobe, right bronchus or lung: Secondary | ICD-10-CM

## 2017-04-12 DIAGNOSIS — Z79899 Other long term (current) drug therapy: Secondary | ICD-10-CM | POA: Diagnosis not present

## 2017-04-12 DIAGNOSIS — C3491 Malignant neoplasm of unspecified part of right bronchus or lung: Secondary | ICD-10-CM

## 2017-04-12 DIAGNOSIS — J910 Malignant pleural effusion: Secondary | ICD-10-CM | POA: Diagnosis not present

## 2017-04-12 LAB — CBC WITH DIFFERENTIAL/PLATELET
BASO%: 0.2 % (ref 0.0–2.0)
Basophils Absolute: 0 10e3/uL (ref 0.0–0.1)
EOS%: 2.4 % (ref 0.0–7.0)
Eosinophils Absolute: 0.1 10e3/uL (ref 0.0–0.5)
HCT: 47.1 % (ref 38.4–49.9)
HGB: 15.2 g/dL (ref 13.0–17.1)
LYMPH%: 43.6 % (ref 14.0–49.0)
MCH: 28.2 pg (ref 27.2–33.4)
MCHC: 32.3 g/dL (ref 32.0–36.0)
MCV: 87.4 fL (ref 79.3–98.0)
MONO#: 0.5 10e3/uL (ref 0.1–0.9)
MONO%: 8.1 % (ref 0.0–14.0)
NEUT#: 2.5 10e3/uL (ref 1.5–6.5)
NEUT%: 45.7 % (ref 39.0–75.0)
Platelets: 306 10e3/uL (ref 140–400)
RBC: 5.39 10e6/uL (ref 4.20–5.82)
RDW: 14.8 % — ABNORMAL HIGH (ref 11.0–14.6)
WBC: 5.5 10e3/uL (ref 4.0–10.3)
lymph#: 2.4 10e3/uL (ref 0.9–3.3)
nRBC: 0 % (ref 0–0)

## 2017-04-12 LAB — COMPREHENSIVE METABOLIC PANEL WITH GFR
ALT: 15 U/L (ref 0–55)
AST: 17 U/L (ref 5–34)
Albumin: 3 g/dL — ABNORMAL LOW (ref 3.5–5.0)
Alkaline Phosphatase: 87 U/L (ref 40–150)
Anion Gap: 9 meq/L (ref 3–11)
BUN: 10.3 mg/dL (ref 7.0–26.0)
CO2: 28 meq/L (ref 22–29)
Calcium: 9.3 mg/dL (ref 8.4–10.4)
Chloride: 103 meq/L (ref 98–109)
Creatinine: 0.9 mg/dL (ref 0.7–1.3)
EGFR: >60 ml/min/1.73 m2
Glucose: 87 mg/dL (ref 70–140)
Potassium: 3.7 meq/L (ref 3.5–5.1)
Sodium: 140 meq/L (ref 136–145)
Total Bilirubin: 0.47 mg/dL (ref 0.20–1.20)
Total Protein: 7.5 g/dL (ref 6.4–8.3)

## 2017-04-12 LAB — TSH: TSH: 1.076 m[IU]/L (ref 0.320–4.118)

## 2017-04-12 MED ORDER — SODIUM CHLORIDE 0.9 % IV SOLN
200.0000 mg | Freq: Once | INTRAVENOUS | Status: AC
  Administered 2017-04-12: 200 mg via INTRAVENOUS
  Filled 2017-04-12: qty 8

## 2017-04-12 MED ORDER — SODIUM CHLORIDE 0.9 % IV SOLN
Freq: Once | INTRAVENOUS | Status: AC
  Administered 2017-04-12: 14:00:00 via INTRAVENOUS

## 2017-04-12 NOTE — Patient Instructions (Signed)
[facility] Discharge Instructions for Patients Receiving Chemotherapy  Today you received the following chemotherapy agents:  Keytruda.  To help prevent nausea and vomiting after your treatment, we encourage you to take your nausea medication as directed.   If you develop nausea and vomiting that is not controlled by your nausea medication, call the clinic.   BELOW ARE SYMPTOMS THAT SHOULD BE REPORTED IMMEDIATELY:  *FEVER GREATER THAN 100.5 F  *CHILLS WITH OR WITHOUT FEVER  NAUSEA AND VOMITING THAT IS NOT CONTROLLED WITH YOUR NAUSEA MEDICATION  *UNUSUAL SHORTNESS OF BREATH  *UNUSUAL BRUISING OR BLEEDING  TENDERNESS IN MOUTH AND THROAT WITH OR WITHOUT PRESENCE OF ULCERS  *URINARY PROBLEMS  *BOWEL PROBLEMS  UNUSUAL RASH Items with * indicate a potential emergency and should be followed up as soon as possible.  Feel free to call the clinic should you have any questions or concerns. The clinic phone number is [phone].  Please show the CHEMO ALERT CARD at check-in to the Emergency Department and triage nurse.    

## 2017-04-12 NOTE — Progress Notes (Addendum)
[facility]  Telephone:(336) [phone] Fax:(336) [phone]  Clinic Follow up Note   Patient Care Team: Curt Bears, MD as PCP - General (Oncology) Prescott Gum, Collier Salina, MD as Consulting Physician (Cardiothoracic Surgery) 04/12/2017  CHIEF COMPLAINTS: F/u lung cancer   DIAGNOSIS: Stage IV (T3, N1, M1a) non-small cell lung cancer, adenocarcinoma diagnosed in July 2018 and presented with a right hilar lymphadenopathy and malignant right pleural effusion diagnosed in July 2018. Molecular studies were negative for EGFR, ALK, ROS 1 and BRAF mutations. PDL 1 expression was 50%  PRIOR THERAPY: None  CURRENT THERAPY: Ketruda (pembrolizumab) 200 mg IV every 3 weeks, status post cycle 2.   INTERVAL HISTORY: Bradley Maldonado returns today for follow-up as scheduled prior to cycle 3 Keytruda.  He received cycle 2 on 03/23/2017, tolerated well with mild fatigue.  Denies skin rash, or diarrhea.  He has occasional transient right upper chest pain, does not require medication. Sutures from previous right pleurex catheter still intact, MD was called away the day he was due to have them removed. Stable dyspnea on exertion, resolves 1-2 minutes after resting.  Mostly dry cough is improving overall, he requires less Robitussin and supplemental O2.  No hemoptysis. Five to 6 days after her last treatment he developed pelvic and perineal soreness, denies sores, rash, or swelling.  He reports decreased libido.  REVIEW OF SYSTEMS:   Constitutional: Denies fevers, chills or abnormal weight loss (+) mild fatigue (+) good appetite Eyes: Denies blurriness of vision Ears, nose, mouth, throat, and face: Denies mucositis or sore throat Respiratory: Denies wheezes (+) intermittent transient right upper chest pain, stable (+) dyspnea on exertion, stable, resolves 1-2 minutes with rest (+) occasionally productive cough, improving overall, requires less cough medicine and supplemental O2 Cardiovascular: Denies  palpitation or lower extremity swelling Gastrointestinal:  Denies nausea, vomiting, constipation, diarrhea, heartburn or change in bowel habits GU: (+) Soreness to pelvis and perineum (+) denies rash, swelling, skin sores  Skin: Denies abnormal skin rashes Lymphatics: Denies new lymphadenopathy or easy bruising Neurological:Denies numbness, tingling or new weaknesses Behavioral/Psych: Mood is stable, no new changes  All other systems were reviewed with the patient and are negative.  MEDICAL HISTORY:  Past Medical History:  Diagnosis Date  . Adenocarcinoma of right lung, stage 4 ([place]) 01/24/2017  . Goals of care, counseling/discussion 01/24/2017  . Hypertension     SURGICAL HISTORY: Past Surgical History:  Procedure Laterality Date  . CHEST TUBE INSERTION Right 02/06/2017   Procedure: INSERTION PLEURAL DRAINAGE CATHETER;  Surgeon: Ivin Poot, MD;  Location: [facility];  Service: Thoracic;  Laterality: Right;  . EYE SURGERY Right    metal removed from eye  . Hip replacement    . IR THORACENTESIS ASP PLEURAL SPACE W/IMG GUIDE  12/19/2016  . REMOVAL OF PLEURAL DRAINAGE CATHETER Right 03/21/2017   Procedure: REMOVAL OF RIGHT PLEURAL DRAINAGE CATHETER;  Surgeon: Ivin Poot, MD;  Location: [facility] OR;  Service: Thoracic;  Laterality: Right;    I have reviewed the social history and family history with the patient and they are unchanged from previous note.  ALLERGIES:  has No Known Allergies.  MEDICATIONS:  Current Outpatient Medications  Medication Sig Dispense Refill  . amLODipine (NORVASC) 5 MG tablet Take 1 tablet (5 mg total) by mouth daily. (Patient taking differently: Take 5 mg by mouth daily with lunch. ) 30 tablet 3  . Omega-3 Fatty Acids (FISH OIL) 1200 MG CAPS Take 1,200 mg by mouth every other day.    Marland Kitchen  guaiFENesin (ROBITUSSIN) 100 MG/5ML SOLN Take 10 mLs by mouth at bedtime as needed for cough or to loosen phlegm.     No current facility-administered medications for this  visit.     PHYSICAL EXAMINATION: ECOG PERFORMANCE STATUS: 1 - Symptomatic but completely ambulatory  Vitals:   04/12/17 1219  BP: 137/87  Pulse: (!) 102  Resp: 17  Temp: 98.7 F (37.1 C)  SpO2: 90%   Filed Weights   04/12/17 1219  Weight: 153 lb 11.2 oz (69.7 kg)    GENERAL:alert, no distress and comfortable SKIN: skin color, texture, turgor are normal, no rashes or significant lesions EYES: normal, Conjunctiva are pink and non-injected, sclera clear OROPHARYNX:no exudate, no erythema and lips, buccal mucosa, and tongue normal  NECK: supple, thyroid normal size, non-tender, without nodularity LYMPH:  no palpable cervical or supraclavicular lymphadenopathy  LUNGS: clear to auscultation laterally, mildly decreased right base, normal breathing effort HEART: regular rate & rhythm and no murmurs and no lower extremity edema ABDOMEN:abdomen soft, non-tender and normal bowel sounds GU: skin intact without rash, lesions, or breakdown. No scrotal edema. Musculoskeletal:no cyanosis of digits and no clubbing  NEURO: alert & oriented x 3 with fluent speech, no focal motor/sensory deficits  LABORATORY DATA:  I have reviewed the data as listed CBC Latest Ref Rng & Units 04/12/2017 03/23/2017 03/02/2017  WBC 4.0 - 10.3 10e3/uL 5.5 6.4 6.4  Hemoglobin 13.0 - 17.1 g/dL 15.2 15.5 16.4  Hematocrit 38.4 - 49.9 % 47.1 47.5 50.7(H)  Platelets 140 - 400 10e3/uL 306 273 262     CMP Latest Ref Rng & Units 04/12/2017 03/23/2017 03/02/2017  Glucose 70 - 140 mg/dl 87 132 123  BUN 7.0 - 26.0 mg/dL 10.3 9.9 15.5  Creatinine 0.7 - 1.3 mg/dL 0.9 0.9 0.9  Sodium 136 - 145 mEq/L 140 138 141  Potassium 3.5 - 5.1 mEq/L 3.7 4.0 4.4  Chloride 101 - 111 mmol/L - - -  CO2 22 - 29 mEq/L _0 Calcium 8.4 - 10.4 mg/dL 9.3 9.6 9.7  Total Protein 6.4 - 8.3 g/dL 7.5 7.8 7.7  Total Bilirubin 0.20 - 1.20 mg/dL 0.47 0.40 0.24  Alkaline Phos 40 - 150 U/L 87 92 81  AST 5 - 34 U/L _1 ALT 0 - 55 U/L _2 RADIOGRAPHIC STUDIES: I have personally reviewed the radiological images as listed and agreed with the findings in the report. No results found.   ASSESSMENT & PLAN:  Adenocarcinoma of right lung, stage 33 ([place]): 66 years old African-American male with a stage IV non-small cell lung cancer, adenocarcinoma diagnosed 11/2016 and presented with a right hilar lymphadenopathy and malignant right pleural effusion. PDL 1 expression of 50% and negative for actionable mutations.  Currently undergoing treatment with immunotherapy with Keytruda 200 mg IV every 3 weeks, status post 2 cycles. He appears to be tolerating chemotherapy well without adverse events or toxicities. Respiratory status is stable overall. For decreased libido I recommend he f/u with PCP. He will proceed with cycle 3 Keytruda today. Will obtain restaging CT CAP prior to next cycle. He will return for lab and f/u to discuss results prior to cycle 4. The patient was seen with Dr. Julien Nordmann.   PLAN: Labs reviewed, OK to proceed with cycle 3 CT CAP with contrast prior to cycle 4, ordered today expected 05/01/17 Return in 3 weeks for lab, f/u, and cycle 4 pending CT results   Orders Placed This  Encounter  Procedures  . CT Abdomen Pelvis W Contrast    Standing Status:   Future    Standing Expiration Date:   04/12/2018    Order Specific Question:   If indicated for the ordered procedure, I authorize the administration of contrast media per Radiology protocol    Answer:   Yes    Order Specific Question:   Preferred imaging location?    Answer:   [facility]    Order Specific Question:   Radiology Contrast Protocol - do NOT remove file path    Answer:   [email or web address]    Order Specific Question:   Reason for Exam additional comments    Answer:   stage IV NSCLC with malignant pleural effusion, s/p cycle 3 keytruda, evaluate response to therapy  . CT Chest W Contrast    Standing Status:    Future    Standing Expiration Date:   04/12/2018    Order Specific Question:   If indicated for the ordered procedure, I authorize the administration of contrast media per Radiology protocol    Answer:   Yes    Order Specific Question:   Preferred imaging location?    Answer:   [facility]    Order Specific Question:   Radiology Contrast Protocol - do NOT remove file path    Answer:   [email or web address]    Order Specific Question:   Reason for Exam additional comments    Answer:   stage IV NSCLC with malignant pleural effusion, s/p cycle 3 keytruda, evaluate response to therapy   All questions were answered. The patient knows to call the clinic with any problems, questions or concerns. No barriers to learning was detected.     Bradley Feeling, NP 04/12/17   ADDENDUM: Hematology/Oncology Attending: I had a face-to-face encounter with the patient today.  I recommended his care plan.  This is a very pleasant 66 years old African-American male with a stage IV non-small cell lung cancer, adenocarcinoma with PDL 1 expression of 50%.  The patient is currently undergoing treatment with immunotherapy with Keytruda as monotherapy status post 2 cycles.  He continues to tolerate this treatment much better.  He denied having any significant nausea or vomiting, he has no skin rash or diarrhea. I recommended for the patient to proceed with cycle #3 today as a scheduled. I will see him back for follow-up visit in 3 weeks for evaluation after repeating CT scan of the chest, abdomen and pelvis for restaging of his disease. The patient was advised to call immediately if he has any concerning symptoms in the interval.  Disclaimer: This note was dictated with voice recognition software. Similar sounding words can inadvertently be transcribed and may be missed upon review. Eilleen Kempf, MD 04/12/17

## 2017-04-19 ENCOUNTER — Ambulatory Visit
Admission: RE | Admit: 2017-04-19 | Discharge: 2017-04-19 | Disposition: A | Discharge status: Home / Self Care | Payer: Medicare Other | Source: Ambulatory Visit | Attending: Cardiothoracic Surgery | Admitting: Cardiothoracic Surgery

## 2017-04-19 ENCOUNTER — Other Ambulatory Visit: Payer: Self-pay

## 2017-04-19 ENCOUNTER — Ambulatory Visit (INDEPENDENT_AMBULATORY_CARE_PROVIDER_SITE_OTHER): Payer: Medicare Other | Admitting: Cardiothoracic Surgery

## 2017-04-19 ENCOUNTER — Encounter: Payer: Self-pay | Admitting: Cardiothoracic Surgery

## 2017-04-19 VITALS — BP 112/72 | HR 78 | Ht 71.0 in | Wt 153.0 lb

## 2017-04-19 DIAGNOSIS — C3490 Malignant neoplasm of unspecified part of unspecified bronchus or lung: Secondary | ICD-10-CM

## 2017-04-19 DIAGNOSIS — Z9889 Other specified postprocedural states: Secondary | ICD-10-CM

## 2017-04-19 DIAGNOSIS — J910 Malignant pleural effusion: Secondary | ICD-10-CM

## 2017-04-19 DIAGNOSIS — R05 Cough: Secondary | ICD-10-CM | POA: Diagnosis not present

## 2017-04-19 NOTE — Progress Notes (Signed)
PCP is Curt Bears, MD Referring Provider is Curt Bears, MD  Chief Complaint  Patient presents with  . Follow-up    HPI: Final office visit after removal of right Pleurx catheter for treatment of malignant right pleural effusion. Patient receiving chemotherapy actively for advanced stage right lung cancer. Pleurx catheter was removed right leg 10 days ago. Chest x-ray today shows scarring at the right lung base, no significant pleural effusion.  Patient has lost a few pounds with the chemotherapy. [name] encouraged to increase his nutrition and to ambulate on a schedule approximate 10 minutes 5 days a week.  Pleurx catheter site has healed. Sutures are removed. Patient will be followed as needed but as long as he is able to receive chemotherapy the pleural effusion should remain in check.  Past Medical History:  Diagnosis Date  . Adenocarcinoma of right lung, stage 4 ([place]) 01/24/2017  . Goals of care, counseling/discussion 01/24/2017  . Hypertension     Past Surgical History:  Procedure Laterality Date  . CHEST TUBE INSERTION Right 02/06/2017   Procedure: INSERTION PLEURAL DRAINAGE CATHETER;  Surgeon: Ivin Poot, MD;  Location: [place];  Service: Thoracic;  Laterality: Right;  . EYE SURGERY Right    metal removed from eye  . Hip replacement    . IR THORACENTESIS ASP PLEURAL SPACE W/IMG GUIDE  12/19/2016  . REMOVAL OF PLEURAL DRAINAGE CATHETER Right 03/21/2017   Procedure: REMOVAL OF RIGHT PLEURAL DRAINAGE CATHETER;  Surgeon: Ivin Poot, MD;  Location: [facility] OR;  Service: Thoracic;  Laterality: Right;    Family History  Problem Relation Age of Onset  . Diabetes Mother   . Lung cancer Father   . Cancer Father   . Cancer - Lung Father     Social History Social History   Tobacco Use  . Smoking status: Current Some Day Smoker    Packs/day: 1.00    Types: Cigarettes  . Smokeless tobacco: Never Used  . Tobacco comment: down to occasional cigarette  Substance  Use Topics  . Alcohol use: Yes    Alcohol/week: 8.4 oz    Types: 14 Cans of beer per week    Comment: occasionally  now since cancer diagnosis  . Drug use: Yes    Types: Marijuana    Comment: none for 2-3 months as of 02/02/17    Current Outpatient Medications  Medication Sig Dispense Refill  . amLODipine (NORVASC) 5 MG tablet Take 1 tablet (5 mg total) by mouth daily. (Patient taking differently: Take 5 mg by mouth daily with lunch. ) 30 tablet 3  . guaiFENesin (ROBITUSSIN) 100 MG/5ML SOLN Take 10 mLs by mouth at bedtime as needed for cough or to loosen phlegm.    . Omega-3 Fatty Acids (FISH OIL) 1200 MG CAPS Take 1,200 mg by mouth every other day.     No current facility-administered medications for this visit.     No Known Allergies  Review of Systems  No fever No cough Mild dyspnea on exertion Weight loss of proximal a 5 pounds over the past 2 months   Physical Exam Blood pressure 118/70 Heart rate 80 regular Oxygen saturation 89% on room air  Diagnostic Tests: Chest x-ray clear with blunting right costophrenic angle  Impression: Pleurx catheter has been removed No significant recurrence of malignant pleural effusion, patient receiving active chemotherapy Surgical site has healed  Plan: Return as needed.   Len Childs, MD [facility] [phone]

## 2017-05-04 ENCOUNTER — Encounter: Payer: Self-pay | Admitting: Internal Medicine

## 2017-05-04 ENCOUNTER — Ambulatory Visit (HOSPITAL_BASED_OUTPATIENT_CLINIC_OR_DEPARTMENT_OTHER): Payer: Medicare Other

## 2017-05-04 ENCOUNTER — Other Ambulatory Visit (HOSPITAL_BASED_OUTPATIENT_CLINIC_OR_DEPARTMENT_OTHER): Payer: Medicare Other

## 2017-05-04 ENCOUNTER — Ambulatory Visit (HOSPITAL_BASED_OUTPATIENT_CLINIC_OR_DEPARTMENT_OTHER): Payer: Medicare Other | Admitting: Internal Medicine

## 2017-05-04 VITALS — BP 143/56 | HR 73 | Temp 98.6°F | Resp 20 | Wt 153.4 lb

## 2017-05-04 DIAGNOSIS — C3491 Malignant neoplasm of unspecified part of right bronchus or lung: Secondary | ICD-10-CM

## 2017-05-04 DIAGNOSIS — C3411 Malignant neoplasm of upper lobe, right bronchus or lung: Secondary | ICD-10-CM | POA: Diagnosis not present

## 2017-05-04 DIAGNOSIS — Z79899 Other long term (current) drug therapy: Secondary | ICD-10-CM

## 2017-05-04 DIAGNOSIS — Z5112 Encounter for antineoplastic immunotherapy: Secondary | ICD-10-CM

## 2017-05-04 DIAGNOSIS — C3412 Malignant neoplasm of upper lobe, left bronchus or lung: Secondary | ICD-10-CM

## 2017-05-04 LAB — COMPREHENSIVE METABOLIC PANEL
ALT: 19 U/L (ref 0–55)
ANION GAP: 12 meq/L — AB (ref 3–11)
AST: 23 U/L (ref 5–34)
Albumin: 3.2 g/dL — ABNORMAL LOW (ref 3.5–5.0)
Alkaline Phosphatase: 91 U/L (ref 40–150)
BILIRUBIN TOTAL: 0.39 mg/dL (ref 0.20–1.20)
BUN: 18.4 mg/dL (ref 7.0–26.0)
CALCIUM: 9.3 mg/dL (ref 8.4–10.4)
CHLORIDE: 103 meq/L (ref 98–109)
CO2: 24 meq/L (ref 22–29)
CREATININE: 1 mg/dL (ref 0.7–1.3)
EGFR: >60 mL/min/{1.73_m2} (ref >60)
Glucose: 105 mg/dl (ref 70–140)
Potassium: 3.9 mEq/L (ref 3.5–5.1)
SODIUM: 138 meq/L (ref 136–145)
TOTAL PROTEIN: 7.7 g/dL (ref 6.4–8.3)

## 2017-05-04 LAB — CBC WITH DIFFERENTIAL/PLATELET
BASO%: 0.4 % (ref 0.0–2.0)
BASOS ABS: 0 10*3/uL (ref 0.0–0.1)
EOS ABS: 0.2 10*3/uL (ref 0.0–0.5)
EOS%: 3.5 % (ref 0.0–7.0)
HEMATOCRIT: 47.7 % (ref 38.4–49.9)
HEMOGLOBIN: 15.3 g/dL (ref 13.0–17.1)
LYMPH%: 39.6 % (ref 14.0–49.0)
MCH: 27.9 pg (ref 27.2–33.4)
MCHC: 32.1 g/dL (ref 32.0–36.0)
MCV: 86.9 fL (ref 79.3–98.0)
MONO#: 0.6 10*3/uL (ref 0.1–0.9)
MONO%: 12.2 % (ref 0.0–14.0)
NEUT#: 2.3 10*3/uL (ref 1.5–6.5)
NEUT%: 44.3 % (ref 39.0–75.0)
Platelets: 210 10*3/uL (ref 140–400)
RBC: 5.49 10*6/uL (ref 4.20–5.82)
RDW: 15.1 % — AB (ref 11.0–14.6)
WBC: 5.1 10*3/uL (ref 4.0–10.3)
lymph#: 2 10*3/uL (ref 0.9–3.3)

## 2017-05-04 LAB — TSH: TSH: 0.766 m[IU]/L (ref 0.320–4.118)

## 2017-05-04 MED ORDER — SODIUM CHLORIDE 0.9 % IV SOLN
200.0000 mg | Freq: Once | INTRAVENOUS | Status: AC
  Administered 2017-05-04: 200 mg via INTRAVENOUS
  Filled 2017-05-04: qty 8

## 2017-05-04 MED ORDER — SODIUM CHLORIDE 0.9 % IV SOLN
Freq: Once | INTRAVENOUS | Status: AC
  Administered 2017-05-04: 13:00:00 via INTRAVENOUS

## 2017-05-04 NOTE — Patient Instructions (Signed)
[facility] Discharge Instructions for Patients Receiving Chemotherapy  Today you received the following chemotherapy agents:  Keytruda.  To help prevent nausea and vomiting after your treatment, we encourage you to take your nausea medication as directed.   If you develop nausea and vomiting that is not controlled by your nausea medication, call the clinic.   BELOW ARE SYMPTOMS THAT SHOULD BE REPORTED IMMEDIATELY:  *FEVER GREATER THAN 100.5 F  *CHILLS WITH OR WITHOUT FEVER  NAUSEA AND VOMITING THAT IS NOT CONTROLLED WITH YOUR NAUSEA MEDICATION  *UNUSUAL SHORTNESS OF BREATH  *UNUSUAL BRUISING OR BLEEDING  TENDERNESS IN MOUTH AND THROAT WITH OR WITHOUT PRESENCE OF ULCERS  *URINARY PROBLEMS  *BOWEL PROBLEMS  UNUSUAL RASH Items with * indicate a potential emergency and should be followed up as soon as possible.  Feel free to call the clinic should you have any questions or concerns. The clinic phone number is [phone].  Please show the CHEMO ALERT CARD at check-in to the Emergency Department and triage nurse.    

## 2017-05-04 NOTE — Progress Notes (Signed)
[place] [phone]) [phone]   Fax:(336) [place], MD [place] [address]  DIAGNOSIS: Stage IV (T3, N1, M1a) non-small cell lung cancer, adenocarcinoma diagnosed in July 2018 and presented with a right hilar lymphadenopathy and malignant right pleural effusion diagnosed in July 2018. Molecular studies were negative for EGFR, ALK, ROS 1 and BRAF mutations. PDL 1 expression was 50%  PRIOR THERAPY: None  CURRENT THERAPY: Ketruda (pembrolizumab) 200 mg IV every 3 weeks, status post 3 cycles.  INTERVAL HISTORY: Bradley Maldonado 66 y.o. male returns to the clinic today for follow-up visit accompanied by his aunt.  The patient is feeling fine today with no specific complaints.  He denied having any chest pain, shortness of breath, cough or hemoptysis.  He denied having any fever or chills.  He has no nausea, vomiting, diarrhea or constipation.  He continues to tolerate his treatment with Keytruda fairly well.  He was supposed to have repeat CT scan of the chest, abdomen and pelvis on May 01, 2017 but this was not scheduled as ordered.  [name] here today for evaluation before starting cycle #4.  MEDICAL HISTORY: Past Medical History:  Diagnosis Date  . Adenocarcinoma of right lung, stage 4 ([place]) 01/24/2017  . Goals of care, counseling/discussion 01/24/2017  . Hypertension     ALLERGIES:  has No Known Allergies.  MEDICATIONS:  Current Outpatient Medications  Medication Sig Dispense Refill  . amLODipine (NORVASC) 5 MG tablet Take 1 tablet (5 mg total) by mouth daily. (Patient taking differently: Take 5 mg by mouth daily with lunch. ) 30 tablet 3  . Omega-3 Fatty Acids (FISH OIL) 1200 MG CAPS Take 1,200 mg by mouth every other day.    Marland Kitchen guaiFENesin (ROBITUSSIN) 100 MG/5ML SOLN Take 10 mLs by mouth at bedtime as needed for cough or to loosen phlegm.     No current facility-administered medications  for this visit.     SURGICAL HISTORY:  Past Surgical History:  Procedure Laterality Date  . CHEST TUBE INSERTION Right 02/06/2017   Procedure: INSERTION PLEURAL DRAINAGE CATHETER;  Surgeon: Ivin Poot, MD;  Location: [facility];  Service: Thoracic;  Laterality: Right;  . EYE SURGERY Right    metal removed from eye  . Hip replacement    . IR THORACENTESIS ASP PLEURAL SPACE W/IMG GUIDE  12/19/2016  . REMOVAL OF PLEURAL DRAINAGE CATHETER Right 03/21/2017   Procedure: REMOVAL OF RIGHT PLEURAL DRAINAGE CATHETER;  Surgeon: Ivin Poot, MD;  Location: [facility];  Service: Thoracic;  Laterality: Right;    REVIEW OF SYSTEMS:  A comprehensive review of systems was negative.   PHYSICAL EXAMINATION: General appearance: alert, cooperative and no distress Head: Normocephalic, without obvious abnormality, atraumatic Neck: no adenopathy, no JVD, supple, symmetrical, trachea midline and thyroid not enlarged, symmetric, no tenderness/mass/nodules Lymph nodes: Cervical, supraclavicular, and axillary nodes normal. Resp: clear to auscultation bilaterally Back: symmetric, no curvature. ROM normal. No CVA tenderness. Cardio: regular rate and rhythm, S1, S2 normal, no murmur, click, rub or gallop GI: soft, non-tender; bowel sounds normal; no masses,  no organomegaly Extremities: extremities normal, atraumatic, no cyanosis or edema  ECOG PERFORMANCE STATUS: 1 - Symptomatic but completely ambulatory  Blood pressure (!) 143/56, pulse 73, temperature 98.6 F (37 C), temperature source Oral, resp. rate 20, weight 153 lb 6.4 oz (69.6 kg), SpO2 92 %.  LABORATORY DATA: Lab Results  Component Value Date  WBC 5.1 05/04/2017   HGB 15.3 05/04/2017   HCT 47.7 05/04/2017   MCV 86.9 05/04/2017   PLT 210 05/04/2017      Chemistry      Component Value Date/Time   NA 138 05/04/2017 1011   K 3.9 05/04/2017 1011   CL 100 (L) 02/06/2017 1230   CO2 24 05/04/2017 1011   BUN 18.4 05/04/2017 1011   CREATININE 1.0  05/04/2017 1011      Component Value Date/Time   CALCIUM 9.3 05/04/2017 1011   ALKPHOS 91 05/04/2017 1011   AST 23 05/04/2017 1011   ALT 19 05/04/2017 1011   BILITOT 0.39 05/04/2017 1011       RADIOGRAPHIC STUDIES: Dg Chest 2 View  Result Date: 04/19/2017 CLINICAL DATA:  Lung cancer, shortness of breath, cough EXAM: CHEST  2 VIEW COMPARISON:  03/15/2017 FINDINGS: Interval removal of previously seen PleurX catheter. Small right pleural effusion versus pleural thickening. No pneumothorax. Diffuse lung disease scratched at diffuse opacities throughout the right lung with masslike area in the right mid lung, unchanged. No confluent opacity on the left. Heart is normal size. IMPRESSION: Interval removal of right PleurX catheter. Stable diffuse airspace disease throughout the right lung with right mid lung mass. Stable small right pleural effusion versus pleural thickening. Electronically Signed   By: Rolm Baptise M.D.   On: 04/19/2017 11:12    ASSESSMENT AND PLAN: This is a very pleasant 66 years old African-American male with a stage IV non-small cell lung cancer, adenocarcinoma with PDL 1 expression of 50% and negative for actionable mutations. The patient is currently undergoing treatment with immunotherapy with Keytruda 200 mg IV every 3 weeks status post 3 cycles.  He tolerated the last cycle of his treatment fairly well with no significant adverse effects. I recommended for him to proceed with cycle #4 today as a scheduled. I will see him back for follow-up visit in 3 weeks for evaluation before starting cycle #5 after repeating CT scan of the chest, abdomen and pelvis for restaging of his disease. He was advised to call immediately if he has any concerning symptoms in the interval. The patient voices understanding of current disease status and treatment options and is in agreement with the current care plan. All questions were answered. The patient knows to call the clinic with any  problems, questions or concerns. We can certainly see the patient much sooner if necessary.  I spent 10 minutes counseling the patient face to face. The total time spent in the appointment was 15 minutes.  Disclaimer: This note was dictated with voice recognition software. Similar sounding words can inadvertently be transcribed and may not be corrected upon review.

## 2017-05-22 ENCOUNTER — Ambulatory Visit (HOSPITAL_COMMUNITY)
Admission: RE | Admit: 2017-05-22 | Discharge: 2017-05-22 | Disposition: A | Discharge status: Home / Self Care | Payer: Medicare Other | Source: Ambulatory Visit | Attending: Nurse Practitioner | Admitting: Nurse Practitioner

## 2017-05-22 DIAGNOSIS — R918 Other nonspecific abnormal finding of lung field: Secondary | ICD-10-CM | POA: Insufficient documentation

## 2017-05-22 DIAGNOSIS — C3490 Malignant neoplasm of unspecified part of unspecified bronchus or lung: Secondary | ICD-10-CM | POA: Diagnosis not present

## 2017-05-22 DIAGNOSIS — R590 Localized enlarged lymph nodes: Secondary | ICD-10-CM | POA: Insufficient documentation

## 2017-05-22 DIAGNOSIS — C3491 Malignant neoplasm of unspecified part of right bronchus or lung: Secondary | ICD-10-CM | POA: Insufficient documentation

## 2017-05-22 MED ORDER — IOPAMIDOL (ISOVUE-300) INJECTION 61%
INTRAVENOUS | Status: AC
  Filled 2017-05-22: qty 100

## 2017-05-22 MED ORDER — IOPAMIDOL (ISOVUE-300) INJECTION 61%
100.0000 mL | Freq: Once | INTRAVENOUS | Status: AC | PRN
  Administered 2017-05-22: 100 mL via INTRAVENOUS

## 2017-05-25 ENCOUNTER — Other Ambulatory Visit: Payer: Medicare Other

## 2017-05-25 ENCOUNTER — Ambulatory Visit: Payer: Medicare Other

## 2017-05-25 ENCOUNTER — Ambulatory Visit: Payer: Medicare Other | Admitting: Internal Medicine

## 2017-06-15 ENCOUNTER — Telehealth: Payer: Self-pay | Admitting: Internal Medicine

## 2017-06-15 ENCOUNTER — Inpatient Hospital Stay (HOSPITAL_BASED_OUTPATIENT_CLINIC_OR_DEPARTMENT_OTHER): Payer: Medicare Other | Admitting: Internal Medicine

## 2017-06-15 ENCOUNTER — Encounter: Payer: Self-pay | Admitting: Internal Medicine

## 2017-06-15 ENCOUNTER — Inpatient Hospital Stay: Payer: Medicare Other

## 2017-06-15 ENCOUNTER — Inpatient Hospital Stay: Payer: Medicare Other | Attending: Internal Medicine

## 2017-06-15 VITALS — BP 136/65 | HR 95 | Temp 98.4°F | Resp 18 | Ht 71.0 in | Wt 155.7 lb

## 2017-06-15 DIAGNOSIS — Z5112 Encounter for antineoplastic immunotherapy: Secondary | ICD-10-CM | POA: Diagnosis not present

## 2017-06-15 DIAGNOSIS — C3411 Malignant neoplasm of upper lobe, right bronchus or lung: Secondary | ICD-10-CM | POA: Insufficient documentation

## 2017-06-15 DIAGNOSIS — J910 Malignant pleural effusion: Secondary | ICD-10-CM

## 2017-06-15 DIAGNOSIS — C3491 Malignant neoplasm of unspecified part of right bronchus or lung: Secondary | ICD-10-CM

## 2017-06-15 DIAGNOSIS — Z79899 Other long term (current) drug therapy: Secondary | ICD-10-CM | POA: Insufficient documentation

## 2017-06-15 LAB — COMPREHENSIVE METABOLIC PANEL
ALBUMIN: 3.2 g/dL — AB (ref 3.5–5.0)
ALK PHOS: 83 U/L (ref 40–150)
ALT: 12 U/L (ref 0–55)
AST: 21 U/L (ref 5–34)
Anion gap: 9 (ref 3–11)
BUN: 11 mg/dL (ref 7–26)
CALCIUM: 9.4 mg/dL (ref 8.4–10.4)
CHLORIDE: 104 mmol/L (ref 98–109)
CO2: 27 mmol/L (ref 22–29)
CREATININE: 0.9 mg/dL (ref 0.70–1.30)
GFR calc Af Amer: >60 mL/min (ref >60)
GFR calc non Af Amer: >60 mL/min (ref >60)
Glucose, Bld: 88 mg/dL (ref 70–140)
Potassium: 3.9 mmol/L (ref 3.5–5.1)
SODIUM: 140 mmol/L (ref 136–145)
Total Bilirubin: 0.4 mg/dL (ref 0.2–1.2)
Total Protein: 7.6 g/dL (ref 6.4–8.3)

## 2017-06-15 LAB — CBC WITH DIFFERENTIAL/PLATELET
BASOS PCT: 0 %
Basophils Absolute: 0 10*3/uL (ref 0.0–0.1)
EOS ABS: 0.1 10*3/uL (ref 0.0–0.5)
Eosinophils Relative: 3 %
HCT: 47.3 % (ref 38.4–49.9)
HEMOGLOBIN: 15 g/dL (ref 13.0–17.1)
LYMPHS ABS: 2 10*3/uL (ref 0.9–3.3)
Lymphocytes Relative: 41 %
MCH: 27.3 pg (ref 27.2–33.4)
MCHC: 31.7 g/dL — ABNORMAL LOW (ref 32.0–36.0)
MCV: 86 fL (ref 79.3–98.0)
MONOS PCT: 9 %
Monocytes Absolute: 0.4 10*3/uL (ref 0.1–0.9)
NEUTROS PCT: 47 %
Neutro Abs: 2.3 10*3/uL (ref 1.5–6.5)
Platelets: 222 10*3/uL (ref 140–400)
RBC: 5.5 MIL/uL (ref 4.20–5.82)
RDW: 16.2 % — AB (ref 11.0–15.6)
WBC: 4.8 10*3/uL (ref 4.0–10.3)

## 2017-06-15 LAB — TSH: TSH: 1.166 u[IU]/mL (ref 0.320–4.118)

## 2017-06-15 MED ORDER — SODIUM CHLORIDE 0.9 % IV SOLN
Freq: Once | INTRAVENOUS | Status: AC
  Administered 2017-06-15: 11:00:00 via INTRAVENOUS

## 2017-06-15 MED ORDER — SODIUM CHLORIDE 0.9 % IV SOLN
200.0000 mg | Freq: Once | INTRAVENOUS | Status: AC
  Administered 2017-06-15: 200 mg via INTRAVENOUS
  Filled 2017-06-15: qty 8

## 2017-06-15 NOTE — Telephone Encounter (Signed)
Scheduled appt per 1/24 los - Gave patient calender of updated schedule.

## 2017-06-15 NOTE — Patient Instructions (Signed)
[facility] Discharge Instructions for Patients Receiving Chemotherapy  Today you received the following chemotherapy agents:  Keytruda.  To help prevent nausea and vomiting after your treatment, we encourage you to take your nausea medication as directed.   If you develop nausea and vomiting that is not controlled by your nausea medication, call the clinic.   BELOW ARE SYMPTOMS THAT SHOULD BE REPORTED IMMEDIATELY:  *FEVER GREATER THAN 100.5 F  *CHILLS WITH OR WITHOUT FEVER  NAUSEA AND VOMITING THAT IS NOT CONTROLLED WITH YOUR NAUSEA MEDICATION  *UNUSUAL SHORTNESS OF BREATH  *UNUSUAL BRUISING OR BLEEDING  TENDERNESS IN MOUTH AND THROAT WITH OR WITHOUT PRESENCE OF ULCERS  *URINARY PROBLEMS  *BOWEL PROBLEMS  UNUSUAL RASH Items with * indicate a potential emergency and should be followed up as soon as possible.  Feel free to call the clinic should you have any questions or concerns. The clinic phone number is [phone].  Please show the CHEMO ALERT CARD at check-in to the Emergency Department and triage nurse.    

## 2017-06-15 NOTE — Progress Notes (Signed)
[facility] [phone]   Fax:([phone]) [place], [place] [address]  DIAGNOSIS: Stage IV (T3, N1, M1a) non-small cell lung cancer, adenocarcinoma diagnosed in July 2018 and presented with a right hilar lymphadenopathy and malignant right pleural effusion diagnosed in July 2018. Molecular studies were negative for EGFR, ALK, ROS 1 and BRAF mutations. PDL 1 expression was 50%  PRIOR THERAPY: None  CURRENT THERAPY: Ketruda (pembrolizumab) 200 mg IV every 3 weeks, status post 4 cycles.  INTERVAL HISTORY: Bradley Maldonado 67 y.o. male returns to the clinic today for follow-up visit accompanied by his brother.  The patient is feeling fine today with no specific complaints.  He continues to tolerate his treatment with Keytruda fairly well.  He denied having any current chest pain, shortness of breath, cough or hemoptysis.  He has no nausea, vomiting, diarrhea or constipation.  He denied having any weight loss or night sweats.  The patient had repeat CT scan of the chest, abdomen and pelvis performed a few weeks ago and he is here for evaluation and discussion of his discuss results and treatment options.  MEDICAL HISTORY: Past Medical History:  Diagnosis Date  . Adenocarcinoma of right lung, stage 4 ([facility]) 01/24/2017  . Goals of care, counseling/discussion 01/24/2017  . Hypertension     ALLERGIES:  has No Known Allergies.  MEDICATIONS:  Current Outpatient Medications  Medication Sig Dispense Refill  . amLODipine (NORVASC) 5 MG tablet Take 1 tablet (5 mg total) by mouth daily. (Patient taking differently: Take 5 mg by mouth daily with lunch. ) 30 tablet 3  . guaiFENesin (ROBITUSSIN) 100 MG/5ML SOLN Take 10 mLs by mouth at bedtime as needed for cough or to loosen phlegm.    . Omega-3 Fatty Acids (FISH OIL) 1200 MG CAPS Take 1,200 mg by mouth every other day.     No current facility-administered  medications for this visit.     SURGICAL HISTORY:  Past Surgical History:  Procedure Laterality Date  . CHEST TUBE INSERTION Right 02/06/2017   Procedure: INSERTION PLEURAL DRAINAGE CATHETER;  Surgeon: Ivin Poot, MD;  Location: [facility];  Service: Thoracic;  Laterality: Right;  . EYE SURGERY Right    metal removed from eye  . Hip replacement    . IR THORACENTESIS ASP PLEURAL SPACE W/IMG GUIDE  12/19/2016  . REMOVAL OF PLEURAL DRAINAGE CATHETER Right 03/21/2017   Procedure: REMOVAL OF RIGHT PLEURAL DRAINAGE CATHETER;  Surgeon: Ivin Poot, MD;  Location: [facility];  Service: Thoracic;  Laterality: Right;    REVIEW OF SYSTEMS:  Constitutional: negative Eyes: negative Ears, nose, mouth, throat, and face: negative Respiratory: negative Cardiovascular: negative Gastrointestinal: negative Genitourinary:negative Integument/breast: negative Hematologic/lymphatic: negative Musculoskeletal:negative Neurological: negative Behavioral/Psych: negative Endocrine: negative Allergic/Immunologic: negative   PHYSICAL EXAMINATION: General appearance: alert, cooperative and no distress Head: Normocephalic, without obvious abnormality, atraumatic Neck: no adenopathy, no JVD, supple, symmetrical, trachea midline and thyroid not enlarged, symmetric, no tenderness/mass/nodules Lymph nodes: Cervical, supraclavicular, and axillary nodes normal. Resp: clear to auscultation bilaterally Back: symmetric, no curvature. ROM normal. No CVA tenderness. Cardio: regular rate and rhythm, S1, S2 normal, no murmur, click, rub or gallop GI: soft, non-tender; bowel sounds normal; no masses,  no organomegaly Extremities: extremities normal, atraumatic, no cyanosis or edema Neurologic: Alert and oriented X 3, normal strength and tone. Normal symmetric reflexes. Normal coordination and gait  ECOG PERFORMANCE STATUS: 1 - Symptomatic but completely  ambulatory  Blood pressure 136/65, pulse 95, temperature 98.4 F  (36.9 C), temperature source Oral, resp. rate 18, height '5\' 11"'$  (1.803 m), weight 155 lb 11.2 oz (70.6 kg), SpO2 90 %.  LABORATORY DATA: Lab Results  Component Value Date   WBC 4.8 10/05/202019   HGB 15.0 10/05/202019   HCT 47.3 10/05/202019   MCV 86.0 10/05/202019   PLT 222 10/05/202019      Chemistry      Component Value Date/Time   NA 140 10/05/202019 0921   NA 138 05/04/2017 1011   K 3.9 10/05/202019 0921   K 3.9 05/04/2017 1011   CL 104 10/05/202019 0921   CO2 27 10/05/202019 0921   CO2 24 05/04/2017 1011   BUN 11 10/05/202019 0921   BUN 18.4 05/04/2017 1011   CREATININE 0.90 10/05/202019 0921   CREATININE 1.0 05/04/2017 1011      Component Value Date/Time   CALCIUM 9.4 10/05/202019 0921   CALCIUM 9.3 05/04/2017 1011   ALKPHOS 83 10/05/202019 0921   ALKPHOS 91 05/04/2017 1011   AST 21 10/05/202019 0921   AST 23 05/04/2017 1011   ALT 12 10/05/202019 0921   ALT 19 05/04/2017 1011   BILITOT 0.4 10/05/202019 0921   BILITOT 0.39 05/04/2017 1011       RADIOGRAPHIC STUDIES: Ct Chest W Contrast  Result Date: 05/22/2017 CLINICAL DATA:  RIGHT lung adenocarcinoma. Stage IV. Immunotherapy ongoing. Keytruda EXAM: CT CHEST, ABDOMEN, AND PELVIS WITH CONTRAST TECHNIQUE: Multidetector CT imaging of the chest, abdomen and pelvis was performed following the standard protocol during bolus administration of intravenous contrast. CONTRAST:  176m ISOVUE-300 IOPAMIDOL (ISOVUE-300) INJECTION 61% COMPARISON:  02/14/2017, PET-CT scan FINDINGS: CT CHEST FINDINGS Cardiovascular: Insert chest Mediastinum/Nodes: No axillary or supraclavicular adenopathy. RIGHT hilar adenopathy and subcarinal adenopathy are not changed. Lungs/Pleura: There is a rind of pleural thickening involving the entire RIGHT hemithorax and extending along the oblique fissure. The degree of thickening of the pleural surface is increased from PET-CT scan 02/14/2017. There is a moderate RIGHT effusion which is also increased. There is interlobular septal  thickening in the RIGHT middle lobe and peripheral consolidation which is also mildly increased. Example peripheral nodule is increased: 16 mm RIGHT lower lobe posterior nodule (image 24, series 3) increased from 10 mm. LEFT lung is hyperinflated. Extensive centrilobular emphysema. No nodularity Musculoskeletal: No aggressive osseous lesion. CT ABDOMEN AND PELVIS FINDINGS Hepatobiliary:  No focal hepatic lesion. Pancreas: Pancreas is normal. No ductal dilatation. No pancreatic inflammation. Spleen: Normal spleen Adrenals/urinary tract: Adrenal glands and kidneys are normal. The ureters and bladder normal. Stomach/Bowel: Stomach, small bowel, appendix, and cecum are normal. The colon and rectosigmoid colon are normal. Vascular/Lymphatic: Enlarged RIGHT common iliac lymph node measures 3.0 cm compared with 3.0 cm. This lymph node has mild hypermetabolic activity greater than liver activity. Group of enlarged lymph node in the RIGHT external iliac nodal station measuring 1.5 cm also with moderate metabolic activity. Activity is less than the metabolic activity of the pleural metastasis. Reproductive: Prostate normal Other: No peritoneal metastasis. Musculoskeletal: No aggressive osseous lesion severe degenerative change in LEFT femoral head IMPRESSION: Chest Impression: *Interval increase in pleural thickening in the RIGHT hemithorax consistent with metastatic progression of pleural disease. *Increase innodularity and consolidation in the RIGHT lung also consists with disease progression. *Stable mediastinal adenopathy 1. Abdomen / Pelvis Impression: *Stable LEFT common iliac and external iliac adenopathy consistent with metastatic lung cancer metastasis versus lymphoma. Favor lymphoproliferative process. Electronically Signed   By: SHelane GuntherD.  On: 05/22/2017 14:48   Ct Abdomen Pelvis W Contrast  Result Date: 05/22/2017 CLINICAL DATA:  RIGHT lung adenocarcinoma. Stage IV. Immunotherapy ongoing.  Keytruda EXAM: CT CHEST, ABDOMEN, AND PELVIS WITH CONTRAST TECHNIQUE: Multidetector CT imaging of the chest, abdomen and pelvis was performed following the standard protocol during bolus administration of intravenous contrast. CONTRAST:  150m ISOVUE-300 IOPAMIDOL (ISOVUE-300) INJECTION 61% COMPARISON:  02/14/2017, PET-CT scan FINDINGS: CT CHEST FINDINGS Cardiovascular: Insert chest Mediastinum/Nodes: No axillary or supraclavicular adenopathy. RIGHT hilar adenopathy and subcarinal adenopathy are not changed. Lungs/Pleura: There is a rind of pleural thickening involving the entire RIGHT hemithorax and extending along the oblique fissure. The degree of thickening of the pleural surface is increased from PET-CT scan 02/14/2017. There is a moderate RIGHT effusion which is also increased. There is interlobular septal thickening in the RIGHT middle lobe and peripheral consolidation which is also mildly increased. Example peripheral nodule is increased: 16 mm RIGHT lower lobe posterior nodule (image 24, series 3) increased from 10 mm. LEFT lung is hyperinflated. Extensive centrilobular emphysema. No nodularity Musculoskeletal: No aggressive osseous lesion. CT ABDOMEN AND PELVIS FINDINGS Hepatobiliary:  No focal hepatic lesion. Pancreas: Pancreas is normal. No ductal dilatation. No pancreatic inflammation. Spleen: Normal spleen Adrenals/urinary tract: Adrenal glands and kidneys are normal. The ureters and bladder normal. Stomach/Bowel: Stomach, small bowel, appendix, and cecum are normal. The colon and rectosigmoid colon are normal. Vascular/Lymphatic: Enlarged RIGHT common iliac lymph node measures 3.0 cm compared with 3.0 cm. This lymph node has mild hypermetabolic activity greater than liver activity. Group of enlarged lymph node in the RIGHT external iliac nodal station measuring 1.5 cm also with moderate metabolic activity. Activity is less than the metabolic activity of the pleural metastasis. Reproductive: Prostate  normal Other: No peritoneal metastasis. Musculoskeletal: No aggressive osseous lesion severe degenerative change in LEFT femoral head IMPRESSION: Chest Impression: *Interval increase in pleural thickening in the RIGHT hemithorax consistent with metastatic progression of pleural disease. *Increase innodularity and consolidation in the RIGHT lung also consists with disease progression. *Stable mediastinal adenopathy 1. Abdomen / Pelvis Impression: *Stable LEFT common iliac and external iliac adenopathy consistent with metastatic lung cancer metastasis versus lymphoma. Favor lymphoproliferative process. Electronically Signed   By: SSuzy BouchardM.D.   On: 05/22/2017 14:48    ASSESSMENT AND PLAN: This is a very pleasant 67years old African-American male with a stage IV non-small cell lung cancer, adenocarcinoma with PDL 1 expression of 50% and negative for actionable mutations. The patient is currently undergoing treatment with immunotherapy with Keytruda 200 mg IV every 3 weeks status post 4 cycles.  The patient continues to tolerate this treatment well with no concerning complaints. He had repeat CT scan of the chest that showed interval increase in the pleural thickening in the right hemithorax suspicious for disease progression but this be also secondary to previous Pleurx catheter placement and scarring versus pseudo-progression. I personally and independently reviewed the scan images and discussed the results with the patient and his brother.  I recommended for him to continue with his current treatment with immunotherapy and he will proceed with cycle #5 today.  I would repeat his imaging studies after cycle #6 to see if the patient has any improvement of his condition or any other further disease progression before switching to a different regimen. I would see the patient back for follow-up visit in 3 weeks for evaluation before the next cycle of his treatment. He was advised to call immediately if  he has any concerning  symptoms in the interval. The patient voices understanding of current disease status and treatment options and is in agreement with the current care plan. All questions were answered. The patient knows to call the clinic with any problems, questions or concerns. We can certainly see the patient much sooner if necessary. I spent 15 minutes counseling the patient face to face. The total time spent in the appointment was 25 minutes.  Disclaimer: This note was dictated with voice recognition software. Similar sounding words can inadvertently be transcribed and may not be corrected upon review.

## 2017-07-06 ENCOUNTER — Inpatient Hospital Stay: Payer: Medicare Other | Attending: Internal Medicine

## 2017-07-06 ENCOUNTER — Encounter: Payer: Self-pay | Admitting: Oncology

## 2017-07-06 ENCOUNTER — Inpatient Hospital Stay (HOSPITAL_BASED_OUTPATIENT_CLINIC_OR_DEPARTMENT_OTHER): Payer: Medicare Other | Admitting: Oncology

## 2017-07-06 ENCOUNTER — Inpatient Hospital Stay: Payer: Medicare Other

## 2017-07-06 VITALS — BP 111/66 | HR 92 | Temp 98.6°F | Resp 20 | Ht 71.0 in | Wt 153.2 lb

## 2017-07-06 DIAGNOSIS — Z79899 Other long term (current) drug therapy: Secondary | ICD-10-CM | POA: Insufficient documentation

## 2017-07-06 DIAGNOSIS — C3491 Malignant neoplasm of unspecified part of right bronchus or lung: Secondary | ICD-10-CM

## 2017-07-06 DIAGNOSIS — Z5112 Encounter for antineoplastic immunotherapy: Secondary | ICD-10-CM

## 2017-07-06 DIAGNOSIS — C3411 Malignant neoplasm of upper lobe, right bronchus or lung: Secondary | ICD-10-CM | POA: Insufficient documentation

## 2017-07-06 LAB — CBC WITH DIFFERENTIAL/PLATELET
Basophils Absolute: 0 10*3/uL (ref 0.0–0.1)
Basophils Relative: 0 %
EOS PCT: 2 %
Eosinophils Absolute: 0.1 10*3/uL (ref 0.0–0.5)
HCT: 47.7 % (ref 38.4–49.9)
HEMOGLOBIN: 15.5 g/dL (ref 13.0–17.1)
LYMPHS ABS: 2.3 10*3/uL (ref 0.9–3.3)
Lymphocytes Relative: 39 %
MCH: 27.3 pg (ref 27.2–33.4)
MCHC: 32.4 g/dL (ref 32.0–36.0)
MCV: 84.1 fL (ref 79.3–98.0)
MONO ABS: 0.7 10*3/uL (ref 0.1–0.9)
MONOS PCT: 12 %
NEUTROS PCT: 47 %
Neutro Abs: 2.8 10*3/uL (ref 1.5–6.5)
Platelets: 270 10*3/uL (ref 140–400)
RBC: 5.67 MIL/uL (ref 4.20–5.82)
RDW: 17.6 % — AB (ref 11.0–14.6)
WBC: 6 10*3/uL (ref 4.0–10.3)

## 2017-07-06 LAB — COMPREHENSIVE METABOLIC PANEL
ALK PHOS: 99 U/L (ref 40–150)
ALT: 18 U/L (ref 0–55)
ANION GAP: 11 (ref 3–11)
AST: 30 U/L (ref 5–34)
Albumin: 3.3 g/dL — ABNORMAL LOW (ref 3.5–5.0)
BUN: 10 mg/dL (ref 7–26)
CALCIUM: 9.5 mg/dL (ref 8.4–10.4)
CO2: 24 mmol/L (ref 22–29)
Chloride: 103 mmol/L (ref 98–109)
Creatinine, Ser: 0.89 mg/dL (ref 0.70–1.30)
GFR calc non Af Amer: >60 mL/min (ref >60)
Glucose, Bld: 101 mg/dL (ref 70–140)
Potassium: 4.6 mmol/L (ref 3.5–5.1)
Sodium: 138 mmol/L (ref 136–145)
TOTAL PROTEIN: 7.9 g/dL (ref 6.4–8.3)
Total Bilirubin: 0.3 mg/dL (ref 0.2–1.2)

## 2017-07-06 LAB — TSH: TSH: 2.986 u[IU]/mL (ref 0.320–4.118)

## 2017-07-06 MED ORDER — SODIUM CHLORIDE 0.9 % IV SOLN
Freq: Once | INTRAVENOUS | Status: AC
  Administered 2017-07-06: 15:00:00 via INTRAVENOUS

## 2017-07-06 MED ORDER — SODIUM CHLORIDE 0.9 % IV SOLN
200.0000 mg | Freq: Once | INTRAVENOUS | Status: AC
  Administered 2017-07-06: 200 mg via INTRAVENOUS
  Filled 2017-07-06: qty 8

## 2017-07-06 NOTE — Patient Instructions (Signed)
[facility] Discharge Instructions for Patients Receiving Chemotherapy  Today you received the following chemotherapy agents :  Keytruda.  To help prevent nausea and vomiting after your treatment, we encourage you to take your nausea medication as prescribed.   If you develop nausea and vomiting that is not controlled by your nausea medication, call the clinic.   BELOW ARE SYMPTOMS THAT SHOULD BE REPORTED IMMEDIATELY:  *FEVER GREATER THAN 100.5 F  *CHILLS WITH OR WITHOUT FEVER  NAUSEA AND VOMITING THAT IS NOT CONTROLLED WITH YOUR NAUSEA MEDICATION  *UNUSUAL SHORTNESS OF BREATH  *UNUSUAL BRUISING OR BLEEDING  TENDERNESS IN MOUTH AND THROAT WITH OR WITHOUT PRESENCE OF ULCERS  *URINARY PROBLEMS  *BOWEL PROBLEMS  UNUSUAL RASH Items with * indicate a potential emergency and should be followed up as soon as possible.  Feel free to call the clinic should you have any questions or concerns. The clinic phone number is [phone].  Please show the CHEMO ALERT CARD at check-in to the Emergency Department and triage nurse.  

## 2017-07-06 NOTE — Assessment & Plan Note (Signed)
This is a very pleasant 67 year old African-American male with a stage IV non-small cell lung cancer, adenocarcinoma with PDL 1 expression of 50% and negative for actionable mutations. The patient is currently undergoing treatment with immunotherapy with Keytruda 200 mg IV every 3 weeks status post 5 cycles.  The patient continues to tolerate this treatment well with no concerning complaints. He had repeat CT scan after 4 cycles which showed interval increase in the pleural thickening in the right hemithorax suspicious for disease progression but this be also secondary to previous Pleurx catheter placement and scarring versus pseudo-progression. The patient was continued on Keytruda.  Recommend he proceed with cycle 6 today as scheduled. We will repeat his imaging studies after this cycle to see if the patient has any improvement of his condition or any other further disease progression before switching to a different regimen.  I would see the patient back for follow-up visit in 3 weeks for evaluation before the next cycle of his treatment and to review his restaging CT scan results.  He was advised to call immediately if he has any concerning symptoms in the interval. The patient voices understanding of current disease status and treatment options and is in agreement with the current care plan. All questions were answered. The patient knows to call the clinic with any problems, questions or concerns. We can certainly see the patient much sooner if necessary.

## 2017-07-06 NOTE — Progress Notes (Signed)
[facility] OFFICE PROGRESS NOTE  Curt Bears, MD [address] [place] [address]  DIAGNOSIS: Stage IV (T3, N1, M1a) non-small cell lung cancer, adenocarcinoma diagnosed in July 2018 and presented with a right hilar lymphadenopathy and malignant right pleural effusion diagnosed in July 2018. Molecular studies were negative for EGFR, ALK, ROS 1 and BRAF mutations. PDL 1 expression was 50%  PRIOR THERAPY: None  CURRENT THERAPY: Keytruda (pembrolizumab) 200 mg IV every 3 weeks, status post 5 cycles.  INTERVAL HISTORY: Bradley Maldonado 67 y.o. male returns for routine follow-up visit accompanied by his aunt.  The patient is feeling fine today with no specific complaints.  He continues to tolerate his Beryle Flock fairly well.  He denies fevers and chills.  Denies chest pain, shortness breath, cough, hemoptysis.  Denies nausea, vomiting, constipation, diarrhea.  Denies rashes.  He has not had any recent weight loss or night sweats.  The patient is here for evaluation prior to cycle #6 of his treatment.  MEDICAL HISTORY: Past Medical History:  Diagnosis Date  . Adenocarcinoma of right lung, stage 4 ([facility]) 01/24/2017  . Goals of care, counseling/discussion 01/24/2017  . Hypertension     ALLERGIES:  has No Known Allergies.  MEDICATIONS:  Current Outpatient Medications  Medication Sig Dispense Refill  . amLODipine (NORVASC) 5 MG tablet Take 1 tablet (5 mg total) by mouth daily. (Patient taking differently: Take 5 mg by mouth daily with lunch. ) 30 tablet 3  . guaiFENesin (ROBITUSSIN) 100 MG/5ML SOLN Take 10 mLs by mouth at bedtime as needed for cough or to loosen phlegm.    . Omega-3 Fatty Acids (FISH OIL) 1200 MG CAPS Take 1,200 mg by mouth every other day.     No current facility-administered medications for this visit.     SURGICAL HISTORY:  Past Surgical History:  Procedure Laterality Date  . CHEST TUBE INSERTION Right 02/06/2017   Procedure: INSERTION  PLEURAL DRAINAGE CATHETER;  Surgeon: Ivin Poot, MD;  Location: [facility];  Service: Thoracic;  Laterality: Right;  . EYE SURGERY Right    metal removed from eye  . Hip replacement    . IR THORACENTESIS ASP PLEURAL SPACE W/IMG GUIDE  12/19/2016  . REMOVAL OF PLEURAL DRAINAGE CATHETER Right 03/21/2017   Procedure: REMOVAL OF RIGHT PLEURAL DRAINAGE CATHETER;  Surgeon: Ivin Poot, MD;  Location: [facility];  Service: Thoracic;  Laterality: Right;    REVIEW OF SYSTEMS:   Review of Systems  Constitutional: Negative for appetite change, chills, fatigue, fever and unexpected weight change.  HENT:   Negative for mouth sores, nosebleeds, sore throat and trouble swallowing.   Eyes: Negative for eye problems and icterus.  Respiratory: Negative for cough, hemoptysis, shortness of breath and wheezing.   Cardiovascular: Negative for chest pain and leg swelling.  Gastrointestinal: Negative for abdominal pain, constipation, diarrhea, nausea and vomiting.  Genitourinary: Negative for bladder incontinence, difficulty urinating, dysuria, frequency and hematuria.   Musculoskeletal: Negative for back pain, gait problem, neck pain and neck stiffness.  Skin: Negative for itching and rash.  Neurological: Negative for dizziness, extremity weakness, gait problem, headaches, light-headedness and seizures.  Hematological: Negative for adenopathy. Does not bruise/bleed easily.  Psychiatric/Behavioral: Negative for confusion, depression and sleep disturbance. The patient is not nervous/anxious.     PHYSICAL EXAMINATION:  Blood pressure 111/66, pulse 92, temperature 98.6 F (37 C), temperature source Oral, resp. rate 20, height 5' 11" (1.803 m), weight 153 lb 3.2 oz (69.5 kg), SpO2 92 %.  ECOG PERFORMANCE STATUS: 1 - Symptomatic but completely ambulatory  Physical Exam  Constitutional: Oriented to person, place, and time and well-developed, well-nourished, and in no distress. No distress.  HENT:  Head:  Normocephalic and atraumatic.  Mouth/Throat: Oropharynx is clear and moist. No oropharyngeal exudate.  Eyes: Conjunctivae are normal. Right eye exhibits no discharge. Left eye exhibits no discharge. No scleral icterus.  Neck: Normal range of motion. Neck supple.  Cardiovascular: Normal rate, regular rhythm, normal heart sounds and intact distal pulses.   Pulmonary/Chest: Effort normal and breath sounds normal. No respiratory distress. No wheezes. No rales.  Abdominal: Soft. Bowel sounds are normal. Exhibits no distension and no mass. There is no tenderness.  Musculoskeletal: Normal range of motion. Exhibits no edema.  Lymphadenopathy:    No cervical adenopathy.  Neurological: Alert and oriented to person, place, and time. Exhibits normal muscle tone. Gait normal. Coordination normal.  Skin: Skin is warm and dry. No rash noted. Not diaphoretic. No erythema. No pallor.  Psychiatric: Mood, memory and judgment normal.  Vitals reviewed.  LABORATORY DATA: Lab Results  Component Value Date   WBC 6.0 07/06/2017   HGB 15.5 07/06/2017   HCT 47.7 07/06/2017   MCV 84.1 07/06/2017   PLT 270 07/06/2017      Chemistry      Component Value Date/Time   NA 138 07/06/2017 1307   NA 138 05/04/2017 1011   K 4.6 07/06/2017 1307   K 3.9 05/04/2017 1011   CL 103 07/06/2017 1307   CO2 24 07/06/2017 1307   CO2 24 05/04/2017 1011   BUN 10 07/06/2017 1307   BUN 18.4 05/04/2017 1011   CREATININE 0.89 07/06/2017 1307   CREATININE 1.0 05/04/2017 1011      Component Value Date/Time   CALCIUM 9.5 07/06/2017 1307   CALCIUM 9.3 05/04/2017 1011   ALKPHOS 99 07/06/2017 1307   ALKPHOS 91 05/04/2017 1011   AST 30 07/06/2017 1307   AST 23 05/04/2017 1011   ALT 18 07/06/2017 1307   ALT 19 05/04/2017 1011   BILITOT 0.3 07/06/2017 1307   BILITOT 0.39 05/04/2017 1011       RADIOGRAPHIC STUDIES:  No results found.   ASSESSMENT/PLAN:  Adenocarcinoma of right lung, stage 4 ([facility]) This is a very  pleasant 67 year old African-American male with a stage IV non-small cell lung cancer, adenocarcinoma with PDL 1 expression of 50% and negative for actionable mutations. The patient is currently undergoing treatment with immunotherapy with Keytruda 200 mg IV every 3 weeks status post 5 cycles.  The patient continues to tolerate this treatment well with no concerning complaints. He had repeat CT scan after 4 cycles which showed interval increase in the pleural thickening in the right hemithorax suspicious for disease progression but this be also secondary to previous Pleurx catheter placement and scarring versus pseudo-progression. The patient was continued on Keytruda.  Recommend he proceed with cycle 6 today as scheduled. We will repeat his imaging studies after this cycle to see if the patient has any improvement of his condition or any other further disease progression before switching to a different regimen.  I would see the patient back for follow-up visit in 3 weeks for evaluation before the next cycle of his treatment and to review his restaging CT scan results.  He was advised to call immediately if he has any concerning symptoms in the interval. The patient voices understanding of current disease status and treatment options and is in agreement with the current care  plan. All questions were answered. The patient knows to call the clinic with any problems, questions or concerns. We can certainly see the patient much sooner if necessary.   Orders Placed This Encounter  Procedures  . CT CHEST W CONTRAST    Standing Status:   Future    Standing Expiration Date:   07/06/2018    Order Specific Question:   If indicated for the ordered procedure, I authorize the administration of contrast media per Radiology protocol    Answer:   Yes    Order Specific Question:   Preferred imaging location?    Answer:   [facility]    Order Specific Question:   Radiology Contrast Protocol - do NOT  remove file path    Answer:   _0 [email or web address]    Order Specific Question:   Reason for Exam additional comments    Answer:   Lung cancer. Resatging.  Marland Kitchen CT ABDOMEN PELVIS W CONTRAST    Standing Status:   Future    Standing Expiration Date:   07/06/2018    Order Specific Question:   If indicated for the ordered procedure, I authorize the administration of contrast media per Radiology protocol    Answer:   Yes    Order Specific Question:   Preferred imaging location?    Answer:   [facility]    Order Specific Question:   Radiology Contrast Protocol - do NOT remove file path    Answer:   _1 [email or web address]    Order Specific Question:   Reason for Exam additional comments    Answer:   Lung cancer. Resatging.    Mikey Bussing, DNP, AGPCNP-BC, AOCNP 07/06/17

## 2017-07-07 ENCOUNTER — Telehealth: Payer: Self-pay | Admitting: Oncology

## 2017-07-07 NOTE — Telephone Encounter (Signed)
Scheduled appt per 2/14 los - patient to get an updated schedule next visit.

## 2017-07-26 ENCOUNTER — Encounter (HOSPITAL_COMMUNITY): Payer: Self-pay

## 2017-07-26 ENCOUNTER — Ambulatory Visit (HOSPITAL_COMMUNITY)
Admission: RE | Admit: 2017-07-26 | Discharge: 2017-07-26 | Disposition: A | Discharge status: Home / Self Care | Payer: Medicare Other | Source: Ambulatory Visit | Attending: Oncology | Admitting: Oncology

## 2017-07-26 DIAGNOSIS — R599 Enlarged lymph nodes, unspecified: Secondary | ICD-10-CM | POA: Diagnosis not present

## 2017-07-26 DIAGNOSIS — C3491 Malignant neoplasm of unspecified part of right bronchus or lung: Secondary | ICD-10-CM | POA: Diagnosis not present

## 2017-07-26 MED ORDER — SODIUM CHLORIDE 0.9 % IJ SOLN
INTRAMUSCULAR | Status: AC
  Filled 2017-07-26: qty 50

## 2017-07-26 MED ORDER — IOPAMIDOL (ISOVUE-300) INJECTION 61%
100.0000 mL | Freq: Once | INTRAVENOUS | Status: AC | PRN
  Administered 2017-07-26: 100 mL via INTRAVENOUS

## 2017-07-26 MED ORDER — IOPAMIDOL (ISOVUE-300) INJECTION 61%
INTRAVENOUS | Status: AC
  Administered 2017-07-26: 100 mL via INTRAVENOUS
  Filled 2017-07-26: qty 100

## 2017-07-27 ENCOUNTER — Telehealth: Payer: Self-pay | Admitting: Internal Medicine

## 2017-07-27 ENCOUNTER — Inpatient Hospital Stay: Payer: Medicare Other | Attending: Internal Medicine

## 2017-07-27 ENCOUNTER — Encounter: Payer: Self-pay | Admitting: Internal Medicine

## 2017-07-27 ENCOUNTER — Inpatient Hospital Stay: Payer: Medicare Other

## 2017-07-27 ENCOUNTER — Inpatient Hospital Stay (HOSPITAL_BASED_OUTPATIENT_CLINIC_OR_DEPARTMENT_OTHER): Payer: Medicare Other | Admitting: Internal Medicine

## 2017-07-27 VITALS — BP 126/69 | HR 93 | Temp 98.5°F | Resp 18 | Ht 71.0 in | Wt 156.2 lb

## 2017-07-27 DIAGNOSIS — Z79899 Other long term (current) drug therapy: Secondary | ICD-10-CM | POA: Insufficient documentation

## 2017-07-27 DIAGNOSIS — C3411 Malignant neoplasm of upper lobe, right bronchus or lung: Secondary | ICD-10-CM

## 2017-07-27 DIAGNOSIS — Z5112 Encounter for antineoplastic immunotherapy: Secondary | ICD-10-CM | POA: Diagnosis not present

## 2017-07-27 DIAGNOSIS — J910 Malignant pleural effusion: Secondary | ICD-10-CM

## 2017-07-27 DIAGNOSIS — I10 Essential (primary) hypertension: Secondary | ICD-10-CM | POA: Diagnosis not present

## 2017-07-27 DIAGNOSIS — C3491 Malignant neoplasm of unspecified part of right bronchus or lung: Secondary | ICD-10-CM

## 2017-07-27 DIAGNOSIS — R5383 Other fatigue: Secondary | ICD-10-CM | POA: Diagnosis not present

## 2017-07-27 LAB — CBC WITH DIFFERENTIAL/PLATELET
Basophils Absolute: 0 10*3/uL (ref 0.0–0.1)
Basophils Relative: 0 %
Eosinophils Absolute: 0.1 10*3/uL (ref 0.0–0.5)
Eosinophils Relative: 2 %
HEMATOCRIT: 46.6 % (ref 38.4–49.9)
Hemoglobin: 14.7 g/dL (ref 13.0–17.1)
LYMPHS PCT: 38 %
Lymphs Abs: 2.1 10*3/uL (ref 0.9–3.3)
MCH: 27.4 pg (ref 27.2–33.4)
MCHC: 31.5 g/dL — ABNORMAL LOW (ref 32.0–36.0)
MCV: 86.9 fL (ref 79.3–98.0)
Monocytes Absolute: 0.7 10*3/uL (ref 0.1–0.9)
Monocytes Relative: 12 %
NEUTROS ABS: 2.7 10*3/uL (ref 1.5–6.5)
NEUTROS PCT: 48 %
Platelets: 260 10*3/uL (ref 140–400)
RBC: 5.36 MIL/uL (ref 4.20–5.82)
RDW: 17 % — ABNORMAL HIGH (ref 11.0–14.6)
WBC: 5.6 10*3/uL (ref 4.0–10.3)

## 2017-07-27 LAB — COMPREHENSIVE METABOLIC PANEL
ALT: 16 U/L (ref 0–55)
ANION GAP: 8 (ref 3–11)
AST: 18 U/L (ref 5–34)
Albumin: 3.1 g/dL — ABNORMAL LOW (ref 3.5–5.0)
Alkaline Phosphatase: 84 U/L (ref 40–150)
BILIRUBIN TOTAL: 0.3 mg/dL (ref 0.2–1.2)
BUN: 9 mg/dL (ref 7–26)
CO2: 26 mmol/L (ref 22–29)
Calcium: 9.5 mg/dL (ref 8.4–10.4)
Chloride: 103 mmol/L (ref 98–109)
Creatinine, Ser: 0.82 mg/dL (ref 0.70–1.30)
GFR calc Af Amer: >60 mL/min (ref >60)
Glucose, Bld: 126 mg/dL (ref 70–140)
Potassium: 4.4 mmol/L (ref 3.5–5.1)
Sodium: 137 mmol/L (ref 136–145)
TOTAL PROTEIN: 7.5 g/dL (ref 6.4–8.3)

## 2017-07-27 LAB — TSH: TSH: 1.873 u[IU]/mL (ref 0.320–4.118)

## 2017-07-27 MED ORDER — SODIUM CHLORIDE 0.9 % IV SOLN
200.0000 mg | Freq: Once | INTRAVENOUS | Status: AC
  Administered 2017-07-27: 200 mg via INTRAVENOUS
  Filled 2017-07-27: qty 8

## 2017-07-27 MED ORDER — SODIUM CHLORIDE 0.9 % IV SOLN
Freq: Once | INTRAVENOUS | Status: AC
  Administered 2017-07-27: 14:00:00 via INTRAVENOUS

## 2017-07-27 NOTE — Progress Notes (Signed)
[place] [phone]   Fax:([phone]) [place], [place] [address]  DIAGNOSIS: Stage IV (T3, N1, M1a) non-small cell lung cancer, adenocarcinoma diagnosed in July 2018 and presented with a right hilar lymphadenopathy and malignant right pleural effusion diagnosed in July 2018. Molecular studies were negative for EGFR, ALK, ROS 1 and BRAF mutations. PDL 1 expression was 50%  PRIOR THERAPY: None  CURRENT THERAPY: Ketruda (pembrolizumab) 200 mg IV every 3 weeks, status post 6 cycles.  INTERVAL HISTORY: Bradley Maldonado 67 y.o. male returns to the clinic today for follow-up visit accompanied by his brother.  The patient is tolerating his current treatment with Keytruda fairly well.  He denied having any skin rash or diarrhea.  He has no chest pain, shortness of breath, cough or hemoptysis.  He denied having any fever or chills.  He has no nausea, vomiting, diarrhea or constipation.  Has no significant weight loss or night sweats.  The patient had repeat CT scan of the chest, abdomen and pelvis performed recently and he is here today for evaluation and discussion of his discuss results and treatment options.  MEDICAL HISTORY: Past Medical History:  Diagnosis Date  . Adenocarcinoma of right lung, stage 4 ([place]) 01/24/2017  . Goals of care, counseling/discussion 01/24/2017  . Hypertension     ALLERGIES:  has No Known Allergies.  MEDICATIONS:  Current Outpatient Medications  Medication Sig Dispense Refill  . amLODipine (NORVASC) 5 MG tablet Take 1 tablet (5 mg total) by mouth daily. (Patient taking differently: Take 5 mg by mouth daily with lunch. ) 30 tablet 3  . guaiFENesin (ROBITUSSIN) 100 MG/5ML SOLN Take 10 mLs by mouth at bedtime as needed for cough or to loosen phlegm.    . Omega-3 Fatty Acids (FISH OIL) 1200 MG CAPS Take 1,200 mg by mouth every other day.     No current  facility-administered medications for this visit.     SURGICAL HISTORY:  Past Surgical History:  Procedure Laterality Date  . CHEST TUBE INSERTION Right 02/06/2017   Procedure: INSERTION PLEURAL DRAINAGE CATHETER;  Surgeon: Ivin Poot, MD;  Location: [facility];  Service: Thoracic;  Laterality: Right;  . EYE SURGERY Right    metal removed from eye  . Hip replacement    . IR THORACENTESIS ASP PLEURAL SPACE W/IMG GUIDE  12/19/2016  . REMOVAL OF PLEURAL DRAINAGE CATHETER Right 03/21/2017   Procedure: REMOVAL OF RIGHT PLEURAL DRAINAGE CATHETER;  Surgeon: Ivin Poot, MD;  Location: [facility];  Service: Thoracic;  Laterality: Right;    REVIEW OF SYSTEMS:  Constitutional: positive for fatigue Eyes: negative Ears, nose, mouth, throat, and face: negative Respiratory: negative Cardiovascular: negative Gastrointestinal: negative Genitourinary:negative Integument/breast: negative Hematologic/lymphatic: negative Musculoskeletal:negative Neurological: negative Behavioral/Psych: negative Endocrine: negative Allergic/Immunologic: negative   PHYSICAL EXAMINATION: General appearance: alert, cooperative and no distress Head: Normocephalic, without obvious abnormality, atraumatic Neck: no adenopathy, no JVD, supple, symmetrical, trachea midline and thyroid not enlarged, symmetric, no tenderness/mass/nodules Lymph nodes: Cervical, supraclavicular, and axillary nodes normal. Resp: clear to auscultation bilaterally Back: symmetric, no curvature. ROM normal. No CVA tenderness. Cardio: regular rate and rhythm, S1, S2 normal, no murmur, click, rub or gallop GI: soft, non-tender; bowel sounds normal; no masses,  no organomegaly Extremities: extremities normal, atraumatic, no cyanosis or edema Neurologic: Alert and oriented X 3, normal strength and tone. Normal symmetric reflexes. Normal coordination and gait  ECOG PERFORMANCE STATUS: 1 -  Symptomatic but completely ambulatory  Blood pressure 126/69,  pulse 93, temperature 98.5 F (36.9 C), temperature source Oral, resp. rate 18, height '5\' 11"'$  (1.803 m), weight 156 lb 3.2 oz (70.9 kg), SpO2 100 %.  LABORATORY DATA: Lab Results  Component Value Date   WBC 5.6 07/27/2017   HGB 14.7 07/27/2017   HCT 46.6 07/27/2017   MCV 86.9 07/27/2017   PLT 260 07/27/2017      Chemistry      Component Value Date/Time   NA 137 07/27/2017 1048   NA 138 05/04/2017 1011   K 4.4 07/27/2017 1048   K 3.9 05/04/2017 1011   CL 103 07/27/2017 1048   CO2 26 07/27/2017 1048   CO2 24 05/04/2017 1011   BUN 9 07/27/2017 1048   BUN 18.4 05/04/2017 1011   CREATININE 0.82 07/27/2017 1048   CREATININE 1.0 05/04/2017 1011      Component Value Date/Time   CALCIUM 9.5 07/27/2017 1048   CALCIUM 9.3 05/04/2017 1011   ALKPHOS 84 07/27/2017 1048   ALKPHOS 91 05/04/2017 1011   AST 18 07/27/2017 1048   AST 23 05/04/2017 1011   ALT 16 07/27/2017 1048   ALT 19 05/04/2017 1011   BILITOT 0.3 07/27/2017 1048   BILITOT 0.39 05/04/2017 1011       RADIOGRAPHIC STUDIES: Ct Chest W Contrast  Result Date: 07/26/2017 CLINICAL DATA:  RIGHT lung carcinoma.  Immunotherapy in progress. EXAM: CT CHEST, ABDOMEN, AND PELVIS WITH CONTRAST TECHNIQUE: Multidetector CT imaging of the chest, abdomen and pelvis was performed following the standard protocol during bolus administration of intravenous contrast. CONTRAST:  100 mL Isovue COMPARISON:  CT 05/22/2017 FINDINGS: CT CHEST FINDINGS Cardiovascular: Coronary artery calcification and aortic atherosclerotic calcification. Mediastinum/Nodes: RIGHT perihilar pleural thickening extends into the mediastinum. Subcarinal lymph node is large 15 mm unchanged from prior. Mediastinal lymph nodes and perihilar thickening not changed. Lungs/Pleura: There is a rind of pleural thickening RIGHT hemithorax involving the pleural mediastinal surface which is very similar to comparison exam. Thickening extends along the oblique and horizontal fissures. No  evidence of progression. Centrilobular emphysema in RIGHT upper lobe. Volume loss the RIGHT hemithorax. Loculated pleural fluid at the RIGHT lung base is also unchanged LEFT lung is clear. Musculoskeletal: No aggressive osseous lesion. CT ABDOMEN AND PELVIS FINDINGS No focal hepatic lesion. Pancreas: Pancreas is normal. No ductal dilatation. No pancreatic inflammation. Spleen: Normal spleen Adrenals/urinary tract: Adrenal glands are normal. There is retroperitoneal adenopathy adjacent adrenal glands. Kidneys normal. Ureters and bladder normal Stomach/Bowel: Stomach, small bowel, appendix, and cecum are normal. The colon and rectosigmoid colon are normal. Vascular/Lymphatic: Abdominal aorta is heavily calcified. No aneurysm Chain of enlarged lymph nodes extend along the IVC and iliac vessels. Example lymph node at the level of the RIGHT renal vein measures 17 mm compared to 13 mm. Lymph node at the level of the RIGHT common iliac artery measures 3.6 cm not changed. RIGHT external iliac lymph node measures 1.9 mm short axis compared to 1.8 cm for no change. No new lymphadenopathy. Reproductive: Prostate normal Other: No peritoneal disease Musculoskeletal: No aggressive osseous lesion. IMPRESSION: Chest Impression: 1. Stable rind of metastatic pleural thickening in the RIGHT hemithorax. 2. Stable RIGHT hilar and subcarinal mediastinal adenopathy 3. No evidence disease progression in the thorax per 4. Stable loculated pleural fluid collection at the RIGHT lung base Abdomen / Pelvis Impression: 1. Stable lymphadenopathy extending along the IVC and RIGHT iliac vessels from the retrocrural location to the external iliac veins. Largest lymph  nodes measure up to 3.6 cm. No significant change in volume of these metastatic nodes. 2. No evidence disease progression or new metastatic disease in the abdomen or pelvis. 3. No skeletal metastasis. Electronically Signed   By: Suzy Bouchard M.D.   On: 07/26/2017 13:08   Ct  Abdomen Pelvis W Contrast  Result Date: 07/26/2017 CLINICAL DATA:  RIGHT lung carcinoma.  Immunotherapy in progress. EXAM: CT CHEST, ABDOMEN, AND PELVIS WITH CONTRAST TECHNIQUE: Multidetector CT imaging of the chest, abdomen and pelvis was performed following the standard protocol during bolus administration of intravenous contrast. CONTRAST:  100 mL Isovue COMPARISON:  CT 05/22/2017 FINDINGS: CT CHEST FINDINGS Cardiovascular: Coronary artery calcification and aortic atherosclerotic calcification. Mediastinum/Nodes: RIGHT perihilar pleural thickening extends into the mediastinum. Subcarinal lymph node is large 15 mm unchanged from prior. Mediastinal lymph nodes and perihilar thickening not changed. Lungs/Pleura: There is a rind of pleural thickening RIGHT hemithorax involving the pleural mediastinal surface which is very similar to comparison exam. Thickening extends along the oblique and horizontal fissures. No evidence of progression. Centrilobular emphysema in RIGHT upper lobe. Volume loss the RIGHT hemithorax. Loculated pleural fluid at the RIGHT lung base is also unchanged LEFT lung is clear. Musculoskeletal: No aggressive osseous lesion. CT ABDOMEN AND PELVIS FINDINGS No focal hepatic lesion. Pancreas: Pancreas is normal. No ductal dilatation. No pancreatic inflammation. Spleen: Normal spleen Adrenals/urinary tract: Adrenal glands are normal. There is retroperitoneal adenopathy adjacent adrenal glands. Kidneys normal. Ureters and bladder normal Stomach/Bowel: Stomach, small bowel, appendix, and cecum are normal. The colon and rectosigmoid colon are normal. Vascular/Lymphatic: Abdominal aorta is heavily calcified. No aneurysm Chain of enlarged lymph nodes extend along the IVC and iliac vessels. Example lymph node at the level of the RIGHT renal vein measures 17 mm compared to 13 mm. Lymph node at the level of the RIGHT common iliac artery measures 3.6 cm not changed. RIGHT external iliac lymph node measures  1.9 mm short axis compared to 1.8 cm for no change. No new lymphadenopathy. Reproductive: Prostate normal Other: No peritoneal disease Musculoskeletal: No aggressive osseous lesion. IMPRESSION: Chest Impression: 1. Stable rind of metastatic pleural thickening in the RIGHT hemithorax. 2. Stable RIGHT hilar and subcarinal mediastinal adenopathy 3. No evidence disease progression in the thorax per 4. Stable loculated pleural fluid collection at the RIGHT lung base Abdomen / Pelvis Impression: 1. Stable lymphadenopathy extending along the IVC and RIGHT iliac vessels from the retrocrural location to the external iliac veins. Largest lymph nodes measure up to 3.6 cm. No significant change in volume of these metastatic nodes. 2. No evidence disease progression or new metastatic disease in the abdomen or pelvis. 3. No skeletal metastasis. Electronically Signed   By: Suzy Bouchard M.D.   On: 07/26/2017 13:08    ASSESSMENT AND PLAN: This is a very pleasant 67 years old African-American male with a stage IV non-small cell lung cancer, adenocarcinoma with PDL 1 expression of 50% and negative for actionable mutations. The patient is currently undergoing treatment with immunotherapy with Keytruda 200 mg IV every 3 weeks status post 6 cycles.  The patient continues to tolerate this treatment well with no concerning complaints. He had repeat CT scan of the chest, abdomen and pelvis performed recently.  I personally and independently reviewed the scans and discussed the results with the patient and his brother. His a scan showed no concerning findings for disease progression. I recommended for the patient to continue his current treatment with [facility] and he will proceed with cycle #7  today as a scheduled. The patient was advised to call immediately if he has any concerning symptoms in the interval. The patient voices understanding of current disease status and treatment options and is in agreement with the current  care plan. All questions were answered. The patient knows to call the clinic with any problems, questions or concerns. We can certainly see the patient much sooner if necessary. I spent 15 minutes counseling the patient face to face. The total time spent in the appointment was 25 minutes.  Disclaimer: This note was dictated with voice recognition software. Similar sounding words can inadvertently be transcribed and may not be corrected upon review.

## 2017-07-27 NOTE — Patient Instructions (Signed)
[facility] Discharge Instructions for Patients Receiving Chemotherapy  Today you received the following chemotherapy agents :  Keytruda.  To help prevent nausea and vomiting after your treatment, we encourage you to take your nausea medication as prescribed.   If you develop nausea and vomiting that is not controlled by your nausea medication, call the clinic.   BELOW ARE SYMPTOMS THAT SHOULD BE REPORTED IMMEDIATELY:  *FEVER GREATER THAN 100.5 F  *CHILLS WITH OR WITHOUT FEVER  NAUSEA AND VOMITING THAT IS NOT CONTROLLED WITH YOUR NAUSEA MEDICATION  *UNUSUAL SHORTNESS OF BREATH  *UNUSUAL BRUISING OR BLEEDING  TENDERNESS IN MOUTH AND THROAT WITH OR WITHOUT PRESENCE OF ULCERS  *URINARY PROBLEMS  *BOWEL PROBLEMS  UNUSUAL RASH Items with * indicate a potential emergency and should be followed up as soon as possible.  Feel free to call the clinic should you have any questions or concerns. The clinic phone number is [phone].  Please show the CHEMO ALERT CARD at check-in to the Emergency Department and triage nurse.  

## 2017-07-27 NOTE — Telephone Encounter (Signed)
Scheduled appt per 3/7 los - patient to get an updated schedule next visit.

## 2017-08-17 ENCOUNTER — Inpatient Hospital Stay: Payer: Medicare Other

## 2017-08-17 ENCOUNTER — Inpatient Hospital Stay (HOSPITAL_BASED_OUTPATIENT_CLINIC_OR_DEPARTMENT_OTHER): Payer: Medicare Other | Admitting: Internal Medicine

## 2017-08-17 ENCOUNTER — Telehealth: Payer: Self-pay

## 2017-08-17 ENCOUNTER — Encounter: Payer: Self-pay | Admitting: Internal Medicine

## 2017-08-17 VITALS — BP 139/72 | HR 95 | Temp 98.0°F | Resp 18 | Ht 71.0 in | Wt 155.1 lb

## 2017-08-17 DIAGNOSIS — Z5112 Encounter for antineoplastic immunotherapy: Secondary | ICD-10-CM | POA: Diagnosis not present

## 2017-08-17 DIAGNOSIS — C3411 Malignant neoplasm of upper lobe, right bronchus or lung: Secondary | ICD-10-CM

## 2017-08-17 DIAGNOSIS — R5383 Other fatigue: Secondary | ICD-10-CM

## 2017-08-17 DIAGNOSIS — I10 Essential (primary) hypertension: Secondary | ICD-10-CM

## 2017-08-17 DIAGNOSIS — J910 Malignant pleural effusion: Secondary | ICD-10-CM | POA: Diagnosis not present

## 2017-08-17 DIAGNOSIS — Z79899 Other long term (current) drug therapy: Secondary | ICD-10-CM | POA: Diagnosis not present

## 2017-08-17 DIAGNOSIS — C3491 Malignant neoplasm of unspecified part of right bronchus or lung: Secondary | ICD-10-CM

## 2017-08-17 LAB — COMPREHENSIVE METABOLIC PANEL
ALT: 12 U/L (ref 0–55)
AST: 19 U/L (ref 5–34)
Albumin: 3.2 g/dL — ABNORMAL LOW (ref 3.5–5.0)
Alkaline Phosphatase: 77 U/L (ref 40–150)
Anion gap: 10 (ref 3–11)
BUN: 11 mg/dL (ref 7–26)
CHLORIDE: 102 mmol/L (ref 98–109)
CO2: 27 mmol/L (ref 22–29)
Calcium: 9.7 mg/dL (ref 8.4–10.4)
Creatinine, Ser: 0.79 mg/dL (ref 0.70–1.30)
GFR calc Af Amer: >60 mL/min (ref >60)
GFR calc non Af Amer: >60 mL/min (ref >60)
Glucose, Bld: 110 mg/dL (ref 70–140)
POTASSIUM: 4.1 mmol/L (ref 3.5–5.1)
SODIUM: 139 mmol/L (ref 136–145)
Total Bilirubin: 0.4 mg/dL (ref 0.2–1.2)
Total Protein: 7.5 g/dL (ref 6.4–8.3)

## 2017-08-17 LAB — TSH: TSH: 1.254 u[IU]/mL (ref 0.320–4.118)

## 2017-08-17 LAB — CBC WITH DIFFERENTIAL/PLATELET
Basophils Absolute: 0 10*3/uL (ref 0.0–0.1)
Basophils Relative: 0 %
Eosinophils Absolute: 0.1 10*3/uL (ref 0.0–0.5)
Eosinophils Relative: 2 %
HCT: 47.2 % (ref 38.4–49.9)
HEMOGLOBIN: 15.3 g/dL (ref 13.0–17.1)
LYMPHS ABS: 1.9 10*3/uL (ref 0.9–3.3)
LYMPHS PCT: 32 %
MCH: 27.4 pg (ref 27.2–33.4)
MCHC: 32.3 g/dL (ref 32.0–36.0)
MCV: 84.6 fL (ref 79.3–98.0)
Monocytes Absolute: 0.4 10*3/uL (ref 0.1–0.9)
Monocytes Relative: 8 %
NEUTROS PCT: 58 %
Neutro Abs: 3.4 10*3/uL (ref 1.5–6.5)
Platelets: 229 10*3/uL (ref 140–400)
RBC: 5.57 MIL/uL (ref 4.20–5.82)
RDW: 17.4 % — ABNORMAL HIGH (ref 11.0–14.6)
WBC: 5.9 10*3/uL (ref 4.0–10.3)

## 2017-08-17 MED ORDER — SODIUM CHLORIDE 0.9 % IV SOLN
Freq: Once | INTRAVENOUS | Status: AC
  Administered 2017-08-17: 11:00:00 via INTRAVENOUS

## 2017-08-17 MED ORDER — SODIUM CHLORIDE 0.9 % IV SOLN
200.0000 mg | Freq: Once | INTRAVENOUS | Status: AC
  Administered 2017-08-17: 200 mg via INTRAVENOUS
  Filled 2017-08-17: qty 8

## 2017-08-17 NOTE — Patient Instructions (Addendum)
[facility] Discharge Instructions for Patients Receiving Chemotherapy  Today you received the following chemotherapy agents:  Keytruda.  To help prevent nausea and vomiting after your treatment, we encourage you to take your nausea medication as directed.   If you develop nausea and vomiting that is not controlled by your nausea medication, call the clinic.   BELOW ARE SYMPTOMS THAT SHOULD BE REPORTED IMMEDIATELY:  *FEVER GREATER THAN 100.5 F  *CHILLS WITH OR WITHOUT FEVER  NAUSEA AND VOMITING THAT IS NOT CONTROLLED WITH YOUR NAUSEA MEDICATION  *UNUSUAL SHORTNESS OF BREATH  *UNUSUAL BRUISING OR BLEEDING  TENDERNESS IN MOUTH AND THROAT WITH OR WITHOUT PRESENCE OF ULCERS  *URINARY PROBLEMS  *BOWEL PROBLEMS  UNUSUAL RASH Items with * indicate a potential emergency and should be followed up as soon as possible.  Feel free to call the clinic should you have any questions or concerns. The clinic phone number is [phone].  Please show the CHEMO ALERT CARD at check-in to the Emergency Department and triage nurse.    

## 2017-08-17 NOTE — Telephone Encounter (Signed)
Printed avs and calender of upcoming appointment. Also added 1 more appointment to already schedule out care plan.

## 2017-08-17 NOTE — Progress Notes (Signed)
[facility] Telephone:([phone]) [phone]   Fax:(336) [place], [place] [address]  DIAGNOSIS: Stage IV (T3, N1, M1a) non-small cell lung cancer, adenocarcinoma diagnosed in July 2018 and presented with a right hilar lymphadenopathy and malignant right pleural effusion diagnosed in July 2018. Molecular studies were negative for EGFR, ALK, ROS 1 and BRAF mutations. PDL 1 expression was 50%  PRIOR THERAPY: None  CURRENT THERAPY: Ketruda (pembrolizumab) 200 mg IV every 3 weeks, status post 7 cycles.  INTERVAL HISTORY: EMANNUEL VISE 67 y.o. male returns to the clinic today for follow-up visit.  The patient has no complaints today.  He continues to tolerate his treatment with Keytruda fairly well.  He denied having any chest pain, shortness of breath, cough or hemoptysis.  He denied having any fever or chills.  He has no significant nausea, vomiting, diarrhea or constipation.  The patient is here today for evaluation before starting cycle #8 of his treatment.  MEDICAL HISTORY: Past Medical History:  Diagnosis Date  . Adenocarcinoma of right lung, stage 4 ([place]) 01/24/2017  . Goals of care, counseling/discussion 01/24/2017  . Hypertension     ALLERGIES:  has No Known Allergies.  MEDICATIONS:  Current Outpatient Medications  Medication Sig Dispense Refill  . amLODipine (NORVASC) 5 MG tablet Take 1 tablet (5 mg total) by mouth daily. (Patient taking differently: Take 5 mg by mouth daily with lunch. ) 30 tablet 3  . guaiFENesin (ROBITUSSIN) 100 MG/5ML SOLN Take 10 mLs by mouth at bedtime as needed for cough or to loosen phlegm.    . Omega-3 Fatty Acids (FISH OIL) 1200 MG CAPS Take 1,200 mg by mouth every other day.     No current facility-administered medications for this visit.     SURGICAL HISTORY:  Past Surgical History:  Procedure Laterality Date  . CHEST TUBE INSERTION Right 02/06/2017   Procedure: INSERTION PLEURAL DRAINAGE CATHETER;  Surgeon: Ivin Poot, MD;  Location: [facility];  Service: Thoracic;  Laterality: Right;  . EYE SURGERY Right    metal removed from eye  . Hip replacement    . IR THORACENTESIS ASP PLEURAL SPACE W/IMG GUIDE  12/19/2016  . REMOVAL OF PLEURAL DRAINAGE CATHETER Right 03/21/2017   Procedure: REMOVAL OF RIGHT PLEURAL DRAINAGE CATHETER;  Surgeon: Ivin Poot, MD;  Location: [facility];  Service: Thoracic;  Laterality: Right;    REVIEW OF SYSTEMS:  A comprehensive review of systems was negative.   PHYSICAL EXAMINATION: General appearance: alert, cooperative and no distress Head: Normocephalic, without obvious abnormality, atraumatic Neck: no adenopathy, no JVD, supple, symmetrical, trachea midline and thyroid not enlarged, symmetric, no tenderness/mass/nodules Lymph nodes: Cervical, supraclavicular, and axillary nodes normal. Resp: clear to auscultation bilaterally Back: symmetric, no curvature. ROM normal. No CVA tenderness. Cardio: regular rate and rhythm, S1, S2 normal, no murmur, click, rub or gallop GI: soft, non-tender; bowel sounds normal; no masses,  no organomegaly Extremities: extremities normal, atraumatic, no cyanosis or edema  ECOG PERFORMANCE STATUS: 1 - Symptomatic but completely ambulatory  Blood pressure 139/72, pulse 95, temperature 98 F (36.7 C), temperature source Oral, resp. rate 18, height _0  (1.803 m), weight 155 lb 1.6 oz (70.4 kg), SpO2 93 %.  LABORATORY DATA: Lab Results  Component Value Date   WBC 5.9 08/17/2017   HGB 15.3 08/17/2017   HCT 47.2 08/17/2017   MCV 84.6 08/17/2017   PLT 229 08/17/2017  Chemistry      Component Value Date/Time   NA 137 07/27/2017 1048   NA 138 05/04/2017 1011   K 4.4 07/27/2017 1048   K 3.9 05/04/2017 1011   CL 103 07/27/2017 1048   CO2 26 07/27/2017 1048   CO2 24 05/04/2017 1011   BUN 9 07/27/2017 1048   BUN 18.4 05/04/2017 1011   CREATININE 0.82 07/27/2017 1048     CREATININE 1.0 05/04/2017 1011      Component Value Date/Time   CALCIUM 9.5 07/27/2017 1048   CALCIUM 9.3 05/04/2017 1011   ALKPHOS 84 07/27/2017 1048   ALKPHOS 91 05/04/2017 1011   AST 18 07/27/2017 1048   AST 23 05/04/2017 1011   ALT 16 07/27/2017 1048   ALT 19 05/04/2017 1011   BILITOT 0.3 07/27/2017 1048   BILITOT 0.39 05/04/2017 1011       RADIOGRAPHIC STUDIES: Ct Chest W Contrast  Result Date: 07/26/2017 CLINICAL DATA:  RIGHT lung carcinoma.  Immunotherapy in progress. EXAM: CT CHEST, ABDOMEN, AND PELVIS WITH CONTRAST TECHNIQUE: Multidetector CT imaging of the chest, abdomen and pelvis was performed following the standard protocol during bolus administration of intravenous contrast. CONTRAST:  100 mL Isovue COMPARISON:  CT 05/22/2017 FINDINGS: CT CHEST FINDINGS Cardiovascular: Coronary artery calcification and aortic atherosclerotic calcification. Mediastinum/Nodes: RIGHT perihilar pleural thickening extends into the mediastinum. Subcarinal lymph node is large 15 mm unchanged from prior. Mediastinal lymph nodes and perihilar thickening not changed. Lungs/Pleura: There is a rind of pleural thickening RIGHT hemithorax involving the pleural mediastinal surface which is very similar to comparison exam. Thickening extends along the oblique and horizontal fissures. No evidence of progression. Centrilobular emphysema in RIGHT upper lobe. Volume loss the RIGHT hemithorax. Loculated pleural fluid at the RIGHT lung base is also unchanged LEFT lung is clear. Musculoskeletal: No aggressive osseous lesion. CT ABDOMEN AND PELVIS FINDINGS No focal hepatic lesion. Pancreas: Pancreas is normal. No ductal dilatation. No pancreatic inflammation. Spleen: Normal spleen Adrenals/urinary tract: Adrenal glands are normal. There is retroperitoneal adenopathy adjacent adrenal glands. Kidneys normal. Ureters and bladder normal Stomach/Bowel: Stomach, small bowel, appendix, and cecum are normal. The colon and  rectosigmoid colon are normal. Vascular/Lymphatic: Abdominal aorta is heavily calcified. No aneurysm Chain of enlarged lymph nodes extend along the IVC and iliac vessels. Example lymph node at the level of the RIGHT renal vein measures 17 mm compared to 13 mm. Lymph node at the level of the RIGHT common iliac artery measures 3.6 cm not changed. RIGHT external iliac lymph node measures 1.9 mm short axis compared to 1.8 cm for no change. No new lymphadenopathy. Reproductive: Prostate normal Other: No peritoneal disease Musculoskeletal: No aggressive osseous lesion. IMPRESSION: Chest Impression: 1. Stable rind of metastatic pleural thickening in the RIGHT hemithorax. 2. Stable RIGHT hilar and subcarinal mediastinal adenopathy 3. No evidence disease progression in the thorax per 4. Stable loculated pleural fluid collection at the RIGHT lung base Abdomen / Pelvis Impression: 1. Stable lymphadenopathy extending along the IVC and RIGHT iliac vessels from the retrocrural location to the external iliac veins. Largest lymph nodes measure up to 3.6 cm. No significant change in volume of these metastatic nodes. 2. No evidence disease progression or new metastatic disease in the abdomen or pelvis. 3. No skeletal metastasis. Electronically Signed   By: Suzy Bouchard M.D.   On: 07/26/2017 13:08   Ct Abdomen Pelvis W Contrast  Result Date: 07/26/2017 CLINICAL DATA:  RIGHT lung carcinoma.  Immunotherapy in progress. EXAM: CT CHEST, ABDOMEN, AND PELVIS  WITH CONTRAST TECHNIQUE: Multidetector CT imaging of the chest, abdomen and pelvis was performed following the standard protocol during bolus administration of intravenous contrast. CONTRAST:  100 mL Isovue COMPARISON:  CT 05/22/2017 FINDINGS: CT CHEST FINDINGS Cardiovascular: Coronary artery calcification and aortic atherosclerotic calcification. Mediastinum/Nodes: RIGHT perihilar pleural thickening extends into the mediastinum. Subcarinal lymph node is large 15 mm unchanged  from prior. Mediastinal lymph nodes and perihilar thickening not changed. Lungs/Pleura: There is a rind of pleural thickening RIGHT hemithorax involving the pleural mediastinal surface which is very similar to comparison exam. Thickening extends along the oblique and horizontal fissures. No evidence of progression. Centrilobular emphysema in RIGHT upper lobe. Volume loss the RIGHT hemithorax. Loculated pleural fluid at the RIGHT lung base is also unchanged LEFT lung is clear. Musculoskeletal: No aggressive osseous lesion. CT ABDOMEN AND PELVIS FINDINGS No focal hepatic lesion. Pancreas: Pancreas is normal. No ductal dilatation. No pancreatic inflammation. Spleen: Normal spleen Adrenals/urinary tract: Adrenal glands are normal. There is retroperitoneal adenopathy adjacent adrenal glands. Kidneys normal. Ureters and bladder normal Stomach/Bowel: Stomach, small bowel, appendix, and cecum are normal. The colon and rectosigmoid colon are normal. Vascular/Lymphatic: Abdominal aorta is heavily calcified. No aneurysm Chain of enlarged lymph nodes extend along the IVC and iliac vessels. Example lymph node at the level of the RIGHT renal vein measures 17 mm compared to 13 mm. Lymph node at the level of the RIGHT common iliac artery measures 3.6 cm not changed. RIGHT external iliac lymph node measures 1.9 mm short axis compared to 1.8 cm for no change. No new lymphadenopathy. Reproductive: Prostate normal Other: No peritoneal disease Musculoskeletal: No aggressive osseous lesion. IMPRESSION: Chest Impression: 1. Stable rind of metastatic pleural thickening in the RIGHT hemithorax. 2. Stable RIGHT hilar and subcarinal mediastinal adenopathy 3. No evidence disease progression in the thorax per 4. Stable loculated pleural fluid collection at the RIGHT lung base Abdomen / Pelvis Impression: 1. Stable lymphadenopathy extending along the IVC and RIGHT iliac vessels from the retrocrural location to the external iliac veins. Largest  lymph nodes measure up to 3.6 cm. No significant change in volume of these metastatic nodes. 2. No evidence disease progression or new metastatic disease in the abdomen or pelvis. 3. No skeletal metastasis. Electronically Signed   By: Suzy Bouchard M.D.   On: 07/26/2017 13:08    ASSESSMENT AND PLAN: This is a very pleasant 67 years old African-American male with a stage IV non-small cell lung cancer, adenocarcinoma with PDL 1 expression of 50% and negative for actionable mutations. The patient is currently undergoing treatment with immunotherapy with Keytruda 200 mg IV every 3 weeks status post 7 cycles.  He tolerated the last cycle of his treatment well with no concerning complaints. I recommended for the patient to continue his current treatment with [facility] and he will proceed with cycle #8 today. The patient was advised to call immediately if he has any concerning symptoms in the interval. The patient voices understanding of current disease status and treatment options and is in agreement with the current care plan. All questions were answered. The patient knows to call the clinic with any problems, questions or concerns. We can certainly see the patient much sooner if necessary. I spent 15 minutes counseling the patient face to face. The total time spent in the appointment was 25 minutes.  Disclaimer: This note was dictated with voice recognition software. Similar sounding words can inadvertently be transcribed and may not be corrected upon review.

## 2017-09-07 ENCOUNTER — Telehealth: Payer: Self-pay | Admitting: Internal Medicine

## 2017-09-07 ENCOUNTER — Inpatient Hospital Stay (HOSPITAL_BASED_OUTPATIENT_CLINIC_OR_DEPARTMENT_OTHER): Payer: Medicare Other | Admitting: Internal Medicine

## 2017-09-07 ENCOUNTER — Inpatient Hospital Stay: Payer: Medicare Other | Attending: Internal Medicine

## 2017-09-07 ENCOUNTER — Inpatient Hospital Stay: Payer: Medicare Other

## 2017-09-07 ENCOUNTER — Encounter: Payer: Self-pay | Admitting: *Deleted

## 2017-09-07 ENCOUNTER — Encounter: Payer: Self-pay | Admitting: Internal Medicine

## 2017-09-07 DIAGNOSIS — C3491 Malignant neoplasm of unspecified part of right bronchus or lung: Secondary | ICD-10-CM

## 2017-09-07 DIAGNOSIS — C3411 Malignant neoplasm of upper lobe, right bronchus or lung: Secondary | ICD-10-CM

## 2017-09-07 DIAGNOSIS — Z5112 Encounter for antineoplastic immunotherapy: Secondary | ICD-10-CM

## 2017-09-07 DIAGNOSIS — Z79899 Other long term (current) drug therapy: Secondary | ICD-10-CM | POA: Insufficient documentation

## 2017-09-07 DIAGNOSIS — I10 Essential (primary) hypertension: Secondary | ICD-10-CM

## 2017-09-07 DIAGNOSIS — R1011 Right upper quadrant pain: Secondary | ICD-10-CM | POA: Diagnosis not present

## 2017-09-07 DIAGNOSIS — J910 Malignant pleural effusion: Secondary | ICD-10-CM | POA: Diagnosis not present

## 2017-09-07 DIAGNOSIS — C3490 Malignant neoplasm of unspecified part of unspecified bronchus or lung: Secondary | ICD-10-CM

## 2017-09-07 LAB — CBC WITH DIFFERENTIAL/PLATELET
Basophils Absolute: 0 10*3/uL (ref 0.0–0.1)
Basophils Relative: 0 %
EOS ABS: 0.1 10*3/uL (ref 0.0–0.5)
Eosinophils Relative: 3 %
HEMATOCRIT: 48.3 % (ref 38.4–49.9)
HEMOGLOBIN: 15.4 g/dL (ref 13.0–17.1)
LYMPHS ABS: 1.9 10*3/uL (ref 0.9–3.3)
LYMPHS PCT: 44 %
MCH: 27.9 pg (ref 27.2–33.4)
MCHC: 31.9 g/dL — AB (ref 32.0–36.0)
MCV: 87.7 fL (ref 79.3–98.0)
MONOS PCT: 9 %
Monocytes Absolute: 0.4 10*3/uL (ref 0.1–0.9)
NEUTROS ABS: 1.9 10*3/uL (ref 1.5–6.5)
NEUTROS PCT: 44 %
Platelets: 208 10*3/uL (ref 140–400)
RBC: 5.51 MIL/uL (ref 4.20–5.82)
RDW: 16.9 % — ABNORMAL HIGH (ref 11.0–14.6)
WBC: 4.3 10*3/uL (ref 4.0–10.3)

## 2017-09-07 LAB — COMPREHENSIVE METABOLIC PANEL
ALT: 13 U/L (ref 0–55)
ANION GAP: 8 (ref 3–11)
AST: 22 U/L (ref 5–34)
Albumin: 3.2 g/dL — ABNORMAL LOW (ref 3.5–5.0)
Alkaline Phosphatase: 82 U/L (ref 40–150)
BUN: 10 mg/dL (ref 7–26)
CHLORIDE: 104 mmol/L (ref 98–109)
CO2: 29 mmol/L (ref 22–29)
Calcium: 9.3 mg/dL (ref 8.4–10.4)
Creatinine, Ser: 0.87 mg/dL (ref 0.70–1.30)
Glucose, Bld: 137 mg/dL (ref 70–140)
POTASSIUM: 4 mmol/L (ref 3.5–5.1)
SODIUM: 141 mmol/L (ref 136–145)
Total Bilirubin: 0.4 mg/dL (ref 0.2–1.2)
Total Protein: 7.2 g/dL (ref 6.4–8.3)

## 2017-09-07 LAB — TSH: TSH: 1.549 u[IU]/mL (ref 0.320–4.118)

## 2017-09-07 MED ORDER — SODIUM CHLORIDE 0.9 % IV SOLN
Freq: Once | INTRAVENOUS | Status: AC
  Administered 2017-09-07: 11:00:00 via INTRAVENOUS

## 2017-09-07 MED ORDER — SODIUM CHLORIDE 0.9 % IV SOLN
200.0000 mg | Freq: Once | INTRAVENOUS | Status: AC
  Administered 2017-09-07: 200 mg via INTRAVENOUS
  Filled 2017-09-07: qty 8

## 2017-09-07 NOTE — Progress Notes (Signed)
Oncology Nurse Navigator Documentation  Oncology Nurse Navigator Flowsheets 09/07/2017  Navigator Location [facility]  Navigator Encounter Type Clinic/MDC/I spoke with patient and family today.  I help to explained treatment plan and next steps.  He verbalized understanding.  Patient stated he continues to smoke. I spoke to him about smoking cessation. I gave and explained information.  I asked that he call me if he needed me to help quitting smoking.   Patient Visit Type MedOnc  Treatment Phase Treatment  Barriers/Navigation Needs Education  Education Smoking cessation  Interventions Education  Education Method Verbal;Written  Acuity Level 2  Acuity Level 2 Educational needs  Time Spent with Patient 30

## 2017-09-07 NOTE — Progress Notes (Signed)
[facility] [phone]   Fax:(336) [place], [place] [address]  DIAGNOSIS: Stage IV (T3, N1, M1a) non-small cell lung cancer, adenocarcinoma diagnosed in July 2018 and presented with a right hilar lymphadenopathy and malignant right pleural effusion diagnosed in July 2018. Molecular studies were negative for EGFR, ALK, ROS 1 and BRAF mutations. PDL 1 expression was 50%  PRIOR THERAPY: None  CURRENT THERAPY: Ketruda (pembrolizumab) 200 mg IV every 3 weeks, status post 8 cycles.  INTERVAL HISTORY: Bradley Maldonado 67 y.o. male returns to the clinic today for follow-up visit accompanied by his aunt.  The patient is feeling fine today with no specific complaints except for intermittent pain in the right upper quadrant.  He describes the pain as muscle cramps.  He denied having any chest pain, shortness of breath, cough or hemoptysis.  He denied having any fever or chills.  He has no nausea, vomiting, diarrhea or constipation.  Is here today for evaluation before starting cycle #9 of his treatment with Keytruda.  MEDICAL HISTORY: Past Medical History:  Diagnosis Date  . Adenocarcinoma of right lung, stage 4 ([place]) 01/24/2017  . Goals of care, counseling/discussion 01/24/2017  . Hypertension     ALLERGIES:  has No Known Allergies.  MEDICATIONS:  Current Outpatient Medications  Medication Sig Dispense Refill  . amLODipine (NORVASC) 5 MG tablet Take 1 tablet (5 mg total) by mouth daily. (Patient taking differently: Take 5 mg by mouth daily with lunch. ) 30 tablet 3  . guaiFENesin (ROBITUSSIN) 100 MG/5ML SOLN Take 10 mLs by mouth at bedtime as needed for cough or to loosen phlegm.    . Omega-3 Fatty Acids (FISH OIL) 1200 MG CAPS Take 1,200 mg by mouth every other day.     No current facility-administered medications for this visit.     SURGICAL HISTORY:  Past Surgical History:    Procedure Laterality Date  . CHEST TUBE INSERTION Right 02/06/2017   Procedure: INSERTION PLEURAL DRAINAGE CATHETER;  Surgeon: Ivin Poot, MD;  Location: [facility];  Service: Thoracic;  Laterality: Right;  . EYE SURGERY Right    metal removed from eye  . Hip replacement    . IR THORACENTESIS ASP PLEURAL SPACE W/IMG GUIDE  12/19/2016  . REMOVAL OF PLEURAL DRAINAGE CATHETER Right 03/21/2017   Procedure: REMOVAL OF RIGHT PLEURAL DRAINAGE CATHETER;  Surgeon: Ivin Poot, MD;  Location: [facility];  Service: Thoracic;  Laterality: Right;    REVIEW OF SYSTEMS:  A comprehensive review of systems was negative.   PHYSICAL EXAMINATION: General appearance: alert, cooperative and no distress Head: Normocephalic, without obvious abnormality, atraumatic Neck: no adenopathy, no JVD, supple, symmetrical, trachea midline and thyroid not enlarged, symmetric, no tenderness/mass/nodules Lymph nodes: Cervical, supraclavicular, and axillary nodes normal. Resp: clear to auscultation bilaterally Back: symmetric, no curvature. ROM normal. No CVA tenderness. Cardio: regular rate and rhythm, S1, S2 normal, no murmur, click, rub or gallop GI: soft, non-tender; bowel sounds normal; no masses,  no organomegaly Extremities: extremities normal, atraumatic, no cyanosis or edema  ECOG PERFORMANCE STATUS: 1 - Symptomatic but completely ambulatory  Blood pressure 129/64, pulse (!) 103, temperature 98.3 F (36.8 C), temperature source Oral, resp. rate 18, height '5\' 11"'$  (1.803 m), weight 152 lb 1.6 oz (69 kg), SpO2 92 %.  LABORATORY DATA: Lab Results  Component Value Date   WBC 4.3 09/07/2017   HGB 15.4 09/07/2017  HCT 48.3 09/07/2017   MCV 87.7 09/07/2017   PLT 208 09/07/2017      Chemistry      Component Value Date/Time   NA 141 09/07/2017 0947   NA 138 05/04/2017 1011   K 4.0 09/07/2017 0947   K 3.9 05/04/2017 1011   CL 104 09/07/2017 0947   CO2 29 09/07/2017 0947   CO2 24 05/04/2017 1011   BUN 10  09/07/2017 0947   BUN 18.4 05/04/2017 1011   CREATININE 0.87 09/07/2017 0947   CREATININE 1.0 05/04/2017 1011      Component Value Date/Time   CALCIUM 9.3 09/07/2017 0947   CALCIUM 9.3 05/04/2017 1011   ALKPHOS 82 09/07/2017 0947   ALKPHOS 91 05/04/2017 1011   AST 22 09/07/2017 0947   AST 23 05/04/2017 1011   ALT 13 09/07/2017 0947   ALT 19 05/04/2017 1011   BILITOT 0.4 09/07/2017 0947   BILITOT 0.39 05/04/2017 1011       RADIOGRAPHIC STUDIES: No results found.  ASSESSMENT AND PLAN: This is a very pleasant 67 years old African-American male with a stage IV non-small cell lung cancer, adenocarcinoma with PDL 1 expression of 50% and negative for actionable mutations. The patient is currently undergoing treatment with immunotherapy with Keytruda 200 mg IV every 3 weeks status post 8 cycles.  He has been tolerating this treatment well with no concerning complaints.  I recommended for him to proceed with cycle #9 today as a scheduled. I will see him back for follow-up visit in 3 weeks for evaluation after repeating CT scan of the chest, abdomen and pelvis for restaging of his disease. The patient was advised to call immediately if he has any concerning symptoms in the interval. The patient voices understanding of current disease status and treatment options and is in agreement with the current care plan. All questions were answered. The patient knows to call the clinic with any problems, questions or concerns. We can certainly see the patient much sooner if necessary. I spent 10 minutes counseling the patient face to face. The total time spent in the appointment was 15 minutes.  Disclaimer: This note was dictated with voice recognition software. Similar sounding words can inadvertently be transcribed and may not be corrected upon review.

## 2017-09-07 NOTE — Patient Instructions (Signed)
[facility] Discharge Instructions for Patients Receiving Chemotherapy  Today you received the following chemotherapy agents:  Keytruda.  To help prevent nausea and vomiting after your treatment, we encourage you to take your nausea medication as directed.   If you develop nausea and vomiting that is not controlled by your nausea medication, call the clinic.   BELOW ARE SYMPTOMS THAT SHOULD BE REPORTED IMMEDIATELY:  *FEVER GREATER THAN 100.5 F  *CHILLS WITH OR WITHOUT FEVER  NAUSEA AND VOMITING THAT IS NOT CONTROLLED WITH YOUR NAUSEA MEDICATION  *UNUSUAL SHORTNESS OF BREATH  *UNUSUAL BRUISING OR BLEEDING  TENDERNESS IN MOUTH AND THROAT WITH OR WITHOUT PRESENCE OF ULCERS  *URINARY PROBLEMS  *BOWEL PROBLEMS  UNUSUAL RASH Items with * indicate a potential emergency and should be followed up as soon as possible.  Feel free to call the clinic should you have any questions or concerns. The clinic phone number is [phone].  Please show the CHEMO ALERT CARD at check-in to the Emergency Department and triage nurse.    

## 2017-09-07 NOTE — Telephone Encounter (Signed)
Gave patient AVs and calendar of upcoming April through June appointments.

## 2017-09-26 ENCOUNTER — Other Ambulatory Visit: Payer: Medicare Other

## 2017-09-26 ENCOUNTER — Inpatient Hospital Stay: Payer: Medicare Other | Attending: Internal Medicine

## 2017-09-26 ENCOUNTER — Ambulatory Visit (HOSPITAL_COMMUNITY)
Admission: RE | Admit: 2017-09-26 | Discharge: 2017-09-26 | Disposition: A | Discharge status: Home / Self Care | Payer: Medicare Other | Source: Ambulatory Visit | Attending: Internal Medicine | Admitting: Internal Medicine

## 2017-09-26 ENCOUNTER — Encounter (HOSPITAL_COMMUNITY): Payer: Self-pay

## 2017-09-26 DIAGNOSIS — M799 Soft tissue disorder, unspecified: Secondary | ICD-10-CM | POA: Diagnosis not present

## 2017-09-26 DIAGNOSIS — C3411 Malignant neoplasm of upper lobe, right bronchus or lung: Secondary | ICD-10-CM | POA: Diagnosis not present

## 2017-09-26 DIAGNOSIS — K4090 Unilateral inguinal hernia, without obstruction or gangrene, not specified as recurrent: Secondary | ICD-10-CM | POA: Diagnosis not present

## 2017-09-26 DIAGNOSIS — Z5112 Encounter for antineoplastic immunotherapy: Secondary | ICD-10-CM

## 2017-09-26 DIAGNOSIS — C3491 Malignant neoplasm of unspecified part of right bronchus or lung: Secondary | ICD-10-CM

## 2017-09-26 DIAGNOSIS — J910 Malignant pleural effusion: Secondary | ICD-10-CM | POA: Diagnosis not present

## 2017-09-26 DIAGNOSIS — R918 Other nonspecific abnormal finding of lung field: Secondary | ICD-10-CM | POA: Insufficient documentation

## 2017-09-26 DIAGNOSIS — I10 Essential (primary) hypertension: Secondary | ICD-10-CM | POA: Insufficient documentation

## 2017-09-26 DIAGNOSIS — Z79899 Other long term (current) drug therapy: Secondary | ICD-10-CM | POA: Insufficient documentation

## 2017-09-26 DIAGNOSIS — R591 Generalized enlarged lymph nodes: Secondary | ICD-10-CM | POA: Insufficient documentation

## 2017-09-26 DIAGNOSIS — C3490 Malignant neoplasm of unspecified part of unspecified bronchus or lung: Secondary | ICD-10-CM | POA: Diagnosis not present

## 2017-09-26 DIAGNOSIS — Z5111 Encounter for antineoplastic chemotherapy: Secondary | ICD-10-CM | POA: Diagnosis not present

## 2017-09-26 DIAGNOSIS — J9383 Other pneumothorax: Secondary | ICD-10-CM | POA: Diagnosis not present

## 2017-09-26 LAB — CBC WITH DIFFERENTIAL/PLATELET
Basophils Absolute: 0 10*3/uL (ref 0.0–0.1)
Basophils Relative: 0 %
EOS ABS: 0.1 10*3/uL (ref 0.0–0.5)
EOS PCT: 2 %
HCT: 51.1 % — ABNORMAL HIGH (ref 38.4–49.9)
Hemoglobin: 16.2 g/dL (ref 13.0–17.1)
LYMPHS ABS: 1.4 10*3/uL (ref 0.9–3.3)
LYMPHS PCT: 29 %
MCH: 28.1 pg (ref 27.2–33.4)
MCHC: 31.7 g/dL — AB (ref 32.0–36.0)
MCV: 88.7 fL (ref 79.3–98.0)
MONO ABS: 0.4 10*3/uL (ref 0.1–0.9)
MONOS PCT: 9 %
Neutro Abs: 3 10*3/uL (ref 1.5–6.5)
Neutrophils Relative %: 60 %
PLATELETS: 228 10*3/uL (ref 140–400)
RBC: 5.76 MIL/uL (ref 4.20–5.82)
RDW: 17.1 % — ABNORMAL HIGH (ref 11.0–14.6)
WBC: 5 10*3/uL (ref 4.0–10.3)

## 2017-09-26 LAB — COMPREHENSIVE METABOLIC PANEL
ALT: 16 U/L (ref 0–55)
AST: 27 U/L (ref 5–34)
Albumin: 3.5 g/dL (ref 3.5–5.0)
Alkaline Phosphatase: 79 U/L (ref 40–150)
Anion gap: 9 (ref 3–11)
BUN: 7 mg/dL (ref 7–26)
CHLORIDE: 102 mmol/L (ref 98–109)
CO2: 30 mmol/L — AB (ref 22–29)
CREATININE: 0.83 mg/dL (ref 0.70–1.30)
Calcium: 9.9 mg/dL (ref 8.4–10.4)
GFR calc Af Amer: >60 mL/min (ref >60)
GLUCOSE: 93 mg/dL (ref 70–140)
Potassium: 4.2 mmol/L (ref 3.5–5.1)
SODIUM: 141 mmol/L (ref 136–145)
Total Bilirubin: 0.4 mg/dL (ref 0.2–1.2)
Total Protein: 7.8 g/dL (ref 6.4–8.3)

## 2017-09-26 LAB — TSH: TSH: 1.306 u[IU]/mL (ref 0.320–4.118)

## 2017-09-26 MED ORDER — IOPAMIDOL (ISOVUE-300) INJECTION 61%
100.0000 mL | Freq: Once | INTRAVENOUS | Status: AC | PRN
  Administered 2017-09-26: 100 mL via INTRAVENOUS

## 2017-09-28 ENCOUNTER — Inpatient Hospital Stay (HOSPITAL_BASED_OUTPATIENT_CLINIC_OR_DEPARTMENT_OTHER): Payer: Medicare Other | Admitting: Internal Medicine

## 2017-09-28 ENCOUNTER — Encounter: Payer: Self-pay | Admitting: Internal Medicine

## 2017-09-28 ENCOUNTER — Inpatient Hospital Stay: Payer: Medicare Other

## 2017-09-28 ENCOUNTER — Telehealth: Payer: Self-pay | Admitting: Internal Medicine

## 2017-09-28 VITALS — BP 129/73 | HR 102 | Temp 99.4°F | Resp 18 | Ht 71.0 in | Wt 152.3 lb

## 2017-09-28 DIAGNOSIS — J910 Malignant pleural effusion: Secondary | ICD-10-CM | POA: Diagnosis not present

## 2017-09-28 DIAGNOSIS — I10 Essential (primary) hypertension: Secondary | ICD-10-CM | POA: Diagnosis not present

## 2017-09-28 DIAGNOSIS — C3411 Malignant neoplasm of upper lobe, right bronchus or lung: Secondary | ICD-10-CM

## 2017-09-28 DIAGNOSIS — C3491 Malignant neoplasm of unspecified part of right bronchus or lung: Secondary | ICD-10-CM

## 2017-09-28 DIAGNOSIS — Z7189 Other specified counseling: Secondary | ICD-10-CM

## 2017-09-28 DIAGNOSIS — Z5112 Encounter for antineoplastic immunotherapy: Secondary | ICD-10-CM | POA: Diagnosis not present

## 2017-09-28 DIAGNOSIS — Z79899 Other long term (current) drug therapy: Secondary | ICD-10-CM | POA: Diagnosis not present

## 2017-09-28 MED ORDER — SODIUM CHLORIDE 0.9 % IV SOLN
Freq: Once | INTRAVENOUS | Status: AC
  Administered 2017-09-28: 11:00:00 via INTRAVENOUS

## 2017-09-28 MED ORDER — SODIUM CHLORIDE 0.9 % IV SOLN
200.0000 mg | Freq: Once | INTRAVENOUS | Status: AC
  Administered 2017-09-28: 200 mg via INTRAVENOUS
  Filled 2017-09-28: qty 8

## 2017-09-28 NOTE — Telephone Encounter (Signed)
Scheduled appt per 5/9 los- pt to get an updated schedule next visit.

## 2017-09-28 NOTE — Patient Instructions (Signed)
[facility] Discharge Instructions for Patients Receiving Chemotherapy  Today you received the following chemotherapy agents :  Keytruda.  To help prevent nausea and vomiting after your treatment, we encourage you to take your nausea medication as prescribed.   If you develop nausea and vomiting that is not controlled by your nausea medication, call the clinic.   BELOW ARE SYMPTOMS THAT SHOULD BE REPORTED IMMEDIATELY:  *FEVER GREATER THAN 100.5 F  *CHILLS WITH OR WITHOUT FEVER  NAUSEA AND VOMITING THAT IS NOT CONTROLLED WITH YOUR NAUSEA MEDICATION  *UNUSUAL SHORTNESS OF BREATH  *UNUSUAL BRUISING OR BLEEDING  TENDERNESS IN MOUTH AND THROAT WITH OR WITHOUT PRESENCE OF ULCERS  *URINARY PROBLEMS  *BOWEL PROBLEMS  UNUSUAL RASH Items with * indicate a potential emergency and should be followed up as soon as possible.  Feel free to call the clinic should you have any questions or concerns. The clinic phone number is [phone].  Please show the CHEMO ALERT CARD at check-in to the Emergency Department and triage nurse.  

## 2017-09-28 NOTE — Progress Notes (Signed)
[facility] [phone]   Fax:([phone]) [place], [place] [address]  DIAGNOSIS: Stage IV (T3, N1, M1a) non-small cell lung cancer, adenocarcinoma diagnosed in July 2018 and presented with a right hilar lymphadenopathy and malignant right pleural effusion diagnosed in July 2018. Molecular studies were negative for EGFR, ALK, ROS 1 and BRAF mutations. PDL 1 expression was 50%  PRIOR THERAPY: None  CURRENT THERAPY: Ketruda (pembrolizumab) 200 mg IV every 3 weeks, status post 9 cycles.  INTERVAL HISTORY: Bradley Maldonado 67 y.o. male returns to the clinic today for follow-up visit accompanied by his brother.  The patient is feeling fine today with no concerning complaints.  He denied having any chest pain, shortness of breath, cough or hemoptysis.  He denied having any fever or chills.  He has no nausea, vomiting, diarrhea or constipation.  He continues to tolerate his treatment with Keytruda fairly well.  He had repeat CT scan of the chest, abdomen and pelvis performed recently and he is here for evaluation and discussion of his discuss results.  MEDICAL HISTORY: Past Medical History:  Diagnosis Date  . Adenocarcinoma of right lung, stage 4 ([facility] [facility]) 01/24/2017  . Goals of care, counseling/discussion 01/24/2017  . Hypertension     ALLERGIES:  has No Known Allergies.  MEDICATIONS:  Current Outpatient Medications  Medication Sig Dispense Refill  . amLODipine (NORVASC) 5 MG tablet Take 1 tablet (5 mg total) by mouth daily. (Patient taking differently: Take 5 mg by mouth daily with lunch. ) 30 tablet 3  . guaiFENesin (ROBITUSSIN) 100 MG/5ML SOLN Take 10 mLs by mouth at bedtime as needed for cough or to loosen phlegm.    . Omega-3 Fatty Acids (FISH OIL) 1200 MG CAPS Take 1,200 mg by mouth every other day.     No current facility-administered medications for this visit.     SURGICAL HISTORY:    Past Surgical History:  Procedure Laterality Date  . CHEST TUBE INSERTION Right 02/06/2017   Procedure: INSERTION PLEURAL DRAINAGE CATHETER;  Surgeon: Ivin Poot, MD;  Location: [facility];  Service: Thoracic;  Laterality: Right;  . EYE SURGERY Right    metal removed from eye  . Hip replacement    . IR THORACENTESIS ASP PLEURAL SPACE W/IMG GUIDE  12/19/2016  . REMOVAL OF PLEURAL DRAINAGE CATHETER Right 03/21/2017   Procedure: REMOVAL OF RIGHT PLEURAL DRAINAGE CATHETER;  Surgeon: Ivin Poot, MD;  Location: [facility];  Service: Thoracic;  Laterality: Right;    REVIEW OF SYSTEMS:  Constitutional: negative Eyes: negative Ears, nose, mouth, throat, and face: negative Respiratory: positive for dyspnea on exertion Cardiovascular: negative Gastrointestinal: negative Genitourinary:negative Integument/breast: negative Hematologic/lymphatic: negative Musculoskeletal:negative Neurological: negative Behavioral/Psych: negative Endocrine: negative Allergic/Immunologic: negative   PHYSICAL EXAMINATION: General appearance: alert, cooperative and no distress Head: Normocephalic, without obvious abnormality, atraumatic Neck: no adenopathy, no JVD, supple, symmetrical, trachea midline and thyroid not enlarged, symmetric, no tenderness/mass/nodules Lymph nodes: Cervical, supraclavicular, and axillary nodes normal. Resp: clear to auscultation bilaterally Back: symmetric, no curvature. ROM normal. No CVA tenderness. Cardio: regular rate and rhythm, S1, S2 normal, no murmur, click, rub or gallop GI: soft, non-tender; bowel sounds normal; no masses,  no organomegaly Extremities: extremities normal, atraumatic, no cyanosis or edema Neurologic: Alert and oriented X 3, normal strength and tone. Normal symmetric reflexes. Normal coordination and gait  ECOG PERFORMANCE STATUS: 1 - Symptomatic but completely ambulatory  Blood pressure 129/73,  pulse (!) 102, temperature 99.4 F (37.4 C), temperature  source Oral, resp. rate 18, height '5\' 11"'$  (1.803 m), weight 152 lb 4.8 oz (69.1 kg), SpO2 90 %.  LABORATORY DATA: Lab Results  Component Value Date   WBC 5.0 09/26/2017   HGB 16.2 09/26/2017   HCT 51.1 (H) 09/26/2017   MCV 88.7 09/26/2017   PLT 228 09/26/2017      Chemistry      Component Value Date/Time   NA 141 09/26/2017 0833   NA 138 05/04/2017 1011   K 4.2 09/26/2017 0833   K 3.9 05/04/2017 1011   CL 102 09/26/2017 0833   CO2 30 (H) 09/26/2017 0833   CO2 24 05/04/2017 1011   BUN 7 09/26/2017 0833   BUN 18.4 05/04/2017 1011   CREATININE 0.83 09/26/2017 0833   CREATININE 1.0 05/04/2017 1011      Component Value Date/Time   CALCIUM 9.9 09/26/2017 0833   CALCIUM 9.3 05/04/2017 1011   ALKPHOS 79 09/26/2017 0833   ALKPHOS 91 05/04/2017 1011   AST 27 09/26/2017 0833   AST 23 05/04/2017 1011   ALT 16 09/26/2017 0833   ALT 19 05/04/2017 1011   BILITOT 0.4 09/26/2017 0833   BILITOT 0.39 05/04/2017 1011       RADIOGRAPHIC STUDIES: Ct Chest W Contrast  Result Date: 09/26/2017 CLINICAL DATA:  Patient with history of non small cell right lung cancer diagnosed 7/18. On chemotherapy. Follow-up evaluation EXAM: CT CHEST, ABDOMEN, AND PELVIS WITH CONTRAST TECHNIQUE: Multidetector CT imaging of the chest, abdomen and pelvis was performed following the standard protocol during bolus administration of intravenous contrast. CONTRAST:  150m ISOVUE-300 IOPAMIDOL (ISOVUE-300) INJECTION 61% COMPARISON:  CT CAP 07/26/2016. FINDINGS: CT CHEST FINDINGS Cardiovascular: Stable mild cardiomegaly. Trace fluid superior pericardial recess. Thoracic aortic vascular calcifications. Coronary arterial vascular calcifications. Mediastinum/Nodes: Stable subcentimeter right axillary lymph nodes. No left axillary adenopathy. Unchanged 1.2 cm right paratracheal lymph node (image 27; series 2). Unchanged 1.5 cm subcarinal lymph node (image 31; series 2). Lungs/Pleura: Central airways are patent. Multiple  scattered pulmonary nodules are demonstrated throughout the left lung, many of which are mildly increased in size when compared to prior exam. Reference subpleural left lower lobe nodule measures 4 mm (image 100; series 4), previously 3 mm. Reference subpleural lingular nodule measures 5 mm (image 91; series 4), previously 4 mm. Reference subpleural left upper lobe nodule measures 5 mm (image 39; series), previously 2 mm. When compared to prior exam there has been interval increase in irregular soft tissue involving the peripheral right upper, right middle and right lower lobes. Mildly increased enhancing pleural-based nodularity throughout the right hemithorax. Persistent loculated moderate right pleural effusion. No pneumothorax. Increased consolidation within the right middle and right lower lobes. Musculoskeletal: Thoracic spine degenerative changes. No aggressive or acute appearing osseous lesions. CT ABDOMEN PELVIS FINDINGS Hepatobiliary: The liver is normal in size and contour. No focal hepatic lesion is identified. Gallbladder is decompressed. No intrahepatic or extrahepatic biliary ductal dilatation. Pancreas: Unremarkable Spleen: Unremarkable Adrenals/Urinary Tract: Normal appearance of the adrenal glands. Kidneys enhance symmetrically with contrast. No hydronephrosis. Urinary bladder is unremarkable. Stomach/Bowel: No abnormal bowel wall thickening or evidence for bowel obstruction. Normal morphology of the stomach. No free fluid or free intraperitoneal air. Vascular/Lymphatic: Re demonstrated extensive retroperitoneal and pelvic adenopathy. Overall, mildly increased in size when compared to prior. Reference right retrocaval lymph node measures 2.3 x 1.9 cm (image 70; series 2), previously 2.3 x 1.7 cm. Reference right periaortic lymph node measures  3.9 x 4.6 cm (image 85; series 2), previously 3.6 x 4.2 cm. Reference right pelvic sidewall lymph node measures 2.4 cm in thickness (image 105; series 2),  previously 1.9 cm. Reproductive: Prostate is unremarkable. Other: Small fat containing right inguinal hernia. Musculoskeletal: Right hip postsurgical changes. Left hip degenerative changes. Lumbar spine degenerative changes. No aggressive or acute appearing osseous lesions. IMPRESSION: 1. Interval increase in irregular consolidation throughout the peripheral aspect of the right lung. Increased consolidation throughout the right lung. 2. Mild interval increase in size of multiple nodules within the left lung. 3. Interval increase in enhancing rind of soft tissue within the right hemithorax. 4. Similar-appearing mediastinal adenopathy. 5. Mild interval increase in size of retroperitoneal adenopathy within the abdomen. Electronically Signed   By: Lovey Newcomer M.D.   On: 09/26/2017 14:13   Ct Abdomen Pelvis W Contrast  Result Date: 09/26/2017 CLINICAL DATA:  Patient with history of non small cell right lung cancer diagnosed 7/18. On chemotherapy. Follow-up evaluation EXAM: CT CHEST, ABDOMEN, AND PELVIS WITH CONTRAST TECHNIQUE: Multidetector CT imaging of the chest, abdomen and pelvis was performed following the standard protocol during bolus administration of intravenous contrast. CONTRAST:  132m ISOVUE-300 IOPAMIDOL (ISOVUE-300) INJECTION 61% COMPARISON:  CT CAP 07/26/2016. FINDINGS: CT CHEST FINDINGS Cardiovascular: Stable mild cardiomegaly. Trace fluid superior pericardial recess. Thoracic aortic vascular calcifications. Coronary arterial vascular calcifications. Mediastinum/Nodes: Stable subcentimeter right axillary lymph nodes. No left axillary adenopathy. Unchanged 1.2 cm right paratracheal lymph node (image 27; series 2). Unchanged 1.5 cm subcarinal lymph node (image 31; series 2). Lungs/Pleura: Central airways are patent. Multiple scattered pulmonary nodules are demonstrated throughout the left lung, many of which are mildly increased in size when compared to prior exam. Reference subpleural left lower lobe  nodule measures 4 mm (image 100; series 4), previously 3 mm. Reference subpleural lingular nodule measures 5 mm (image 91; series 4), previously 4 mm. Reference subpleural left upper lobe nodule measures 5 mm (image 39; series), previously 2 mm. When compared to prior exam there has been interval increase in irregular soft tissue involving the peripheral right upper, right middle and right lower lobes. Mildly increased enhancing pleural-based nodularity throughout the right hemithorax. Persistent loculated moderate right pleural effusion. No pneumothorax. Increased consolidation within the right middle and right lower lobes. Musculoskeletal: Thoracic spine degenerative changes. No aggressive or acute appearing osseous lesions. CT ABDOMEN PELVIS FINDINGS Hepatobiliary: The liver is normal in size and contour. No focal hepatic lesion is identified. Gallbladder is decompressed. No intrahepatic or extrahepatic biliary ductal dilatation. Pancreas: Unremarkable Spleen: Unremarkable Adrenals/Urinary Tract: Normal appearance of the adrenal glands. Kidneys enhance symmetrically with contrast. No hydronephrosis. Urinary bladder is unremarkable. Stomach/Bowel: No abnormal bowel wall thickening or evidence for bowel obstruction. Normal morphology of the stomach. No free fluid or free intraperitoneal air. Vascular/Lymphatic: Re demonstrated extensive retroperitoneal and pelvic adenopathy. Overall, mildly increased in size when compared to prior. Reference right retrocaval lymph node measures 2.3 x 1.9 cm (image 70; series 2), previously 2.3 x 1.7 cm. Reference right periaortic lymph node measures 3.9 x 4.6 cm (image 85; series 2), previously 3.6 x 4.2 cm. Reference right pelvic sidewall lymph node measures 2.4 cm in thickness (image 105; series 2), previously 1.9 cm. Reproductive: Prostate is unremarkable. Other: Small fat containing right inguinal hernia. Musculoskeletal: Right hip postsurgical changes. Left hip degenerative  changes. Lumbar spine degenerative changes. No aggressive or acute appearing osseous lesions. IMPRESSION: 1. Interval increase in irregular consolidation throughout the peripheral aspect of the right lung. Increased  consolidation throughout the right lung. 2. Mild interval increase in size of multiple nodules within the left lung. 3. Interval increase in enhancing rind of soft tissue within the right hemithorax. 4. Similar-appearing mediastinal adenopathy. 5. Mild interval increase in size of retroperitoneal adenopathy within the abdomen. Electronically Signed   By: Lovey Newcomer M.D.   On: 09/26/2017 14:13    ASSESSMENT AND PLAN: This is a very pleasant 67 years old African-American male with a stage IV non-small cell lung cancer, adenocarcinoma with PDL 1 expression of 50% and negative for actionable mutations. The patient is currently undergoing treatment with immunotherapy with Keytruda 200 mg IV every 3 weeks status post 9 cycles.  The patient continues to tolerate his treatment well with no concerning complaints. He had repeat CT scan of the chest, abdomen and pelvis performed recently.  I personally and independently reviewed the scan images and discussed the results with the patient and his brother.  His a scan showed mild increase in the consolidation of the right lung as well as multiple nodules within the left lung but similar appearance of the mediastinal lymphadenopathy.  There was also mild increase in size of retroperitoneal adenopathy within the abdomen. I discussed with the patient several options for management of his condition including palliative care versus continuation of his current treatment with Keytruda taking into consideration the slow disease progression versus switching treatment with systemic chemotherapy with carboplatin and Alimta. After discussion of all the options, the patient would like to continue his current treatment with [facility] for few more cycles. He would proceed  with cycle #10 today. I will see him back for follow-up visit in 3 weeks for evaluation before starting cycle #11. The patient was advised to call immediately if he has any concerning symptoms in the interval. The patient voices understanding of current disease status and treatment options and is in agreement with the current care plan. All questions were answered. The patient knows to call the clinic with any problems, questions or concerns. We can certainly see the patient much sooner if necessary.  Disclaimer: This note was dictated with voice recognition software. Similar sounding words can inadvertently be transcribed and may not be corrected upon review.

## 2017-10-19 ENCOUNTER — Inpatient Hospital Stay: Payer: Medicare Other

## 2017-10-19 ENCOUNTER — Telehealth: Payer: Self-pay | Admitting: Internal Medicine

## 2017-10-19 ENCOUNTER — Encounter: Payer: Self-pay | Admitting: Internal Medicine

## 2017-10-19 ENCOUNTER — Inpatient Hospital Stay (HOSPITAL_BASED_OUTPATIENT_CLINIC_OR_DEPARTMENT_OTHER): Payer: Medicare Other | Admitting: Internal Medicine

## 2017-10-19 VITALS — BP 148/82 | HR 97 | Temp 98.7°F | Resp 18 | Ht 71.0 in | Wt 154.1 lb

## 2017-10-19 DIAGNOSIS — C3411 Malignant neoplasm of upper lobe, right bronchus or lung: Secondary | ICD-10-CM | POA: Diagnosis not present

## 2017-10-19 DIAGNOSIS — C3491 Malignant neoplasm of unspecified part of right bronchus or lung: Secondary | ICD-10-CM

## 2017-10-19 DIAGNOSIS — J910 Malignant pleural effusion: Secondary | ICD-10-CM

## 2017-10-19 DIAGNOSIS — Z5112 Encounter for antineoplastic immunotherapy: Secondary | ICD-10-CM

## 2017-10-19 DIAGNOSIS — Z79899 Other long term (current) drug therapy: Secondary | ICD-10-CM | POA: Diagnosis not present

## 2017-10-19 DIAGNOSIS — I10 Essential (primary) hypertension: Secondary | ICD-10-CM | POA: Diagnosis not present

## 2017-10-19 LAB — CBC WITH DIFFERENTIAL/PLATELET
BASOS ABS: 0 10*3/uL (ref 0.0–0.1)
BASOS PCT: 0 %
EOS ABS: 0.1 10*3/uL (ref 0.0–0.5)
Eosinophils Relative: 3 %
HCT: 47.1 % (ref 38.4–49.9)
HEMOGLOBIN: 15.1 g/dL (ref 13.0–17.1)
Lymphocytes Relative: 37 %
Lymphs Abs: 1.7 10*3/uL (ref 0.9–3.3)
MCH: 28.2 pg (ref 27.2–33.4)
MCHC: 32.1 g/dL (ref 32.0–36.0)
MCV: 88 fL (ref 79.3–98.0)
MONOS PCT: 9 %
Monocytes Absolute: 0.4 10*3/uL (ref 0.1–0.9)
NEUTROS PCT: 51 %
Neutro Abs: 2.3 10*3/uL (ref 1.5–6.5)
Platelets: 192 10*3/uL (ref 140–400)
RBC: 5.35 MIL/uL (ref 4.20–5.82)
RDW: 16.4 % — ABNORMAL HIGH (ref 11.0–14.6)
WBC: 4.5 10*3/uL (ref 4.0–10.3)

## 2017-10-19 LAB — COMPREHENSIVE METABOLIC PANEL
ALK PHOS: 74 U/L (ref 40–150)
ALT: 12 U/L (ref 0–55)
ANION GAP: 11 (ref 3–11)
AST: 20 U/L (ref 5–34)
Albumin: 3.2 g/dL — ABNORMAL LOW (ref 3.5–5.0)
BILIRUBIN TOTAL: 0.3 mg/dL (ref 0.2–1.2)
BUN: 10 mg/dL (ref 7–26)
CALCIUM: 9.1 mg/dL (ref 8.4–10.4)
CO2: 27 mmol/L (ref 22–29)
CREATININE: 0.84 mg/dL (ref 0.70–1.30)
Chloride: 104 mmol/L (ref 98–109)
Glucose, Bld: 107 mg/dL (ref 70–140)
Potassium: 3.8 mmol/L (ref 3.5–5.1)
Sodium: 142 mmol/L (ref 136–145)
TOTAL PROTEIN: 7.2 g/dL (ref 6.4–8.3)

## 2017-10-19 LAB — TSH: TSH: 1.19 u[IU]/mL (ref 0.320–4.118)

## 2017-10-19 MED ORDER — SODIUM CHLORIDE 0.9 % IV SOLN
200.0000 mg | Freq: Once | INTRAVENOUS | Status: AC
  Administered 2017-10-19: 200 mg via INTRAVENOUS
  Filled 2017-10-19: qty 8

## 2017-10-19 MED ORDER — SODIUM CHLORIDE 0.9 % IV SOLN
Freq: Once | INTRAVENOUS | Status: AC
  Administered 2017-10-19: 11:00:00 via INTRAVENOUS

## 2017-10-19 NOTE — Progress Notes (Signed)
[facility] [phone]   Fax:([phone]) [place], [place] [address]  DIAGNOSIS: Stage IV (T3, N1, M1a) non-small cell lung cancer, adenocarcinoma diagnosed in July 2018 and presented with a right hilar lymphadenopathy and malignant right pleural effusion diagnosed in July 2018. Molecular studies were negative for EGFR, ALK, ROS 1 and BRAF mutations. PDL 1 expression was 50%  PRIOR THERAPY: None  CURRENT THERAPY: Ketruda (pembrolizumab) 200 mg IV every 3 weeks, status post 10 cycles.  INTERVAL HISTORY: Bradley Maldonado 67 y.o. male returns to the clinic today for follow-up visit accompanied by his aunt.  The patient is feeling fine today with no specific complaints.  He denied having any chest pain but has shortness breath with exertion with no cough or hemoptysis.  He denied having any recent weight loss or night sweats.  He has no nausea, vomiting, diarrhea or constipation.  He continues to tolerate his treatment with Keytruda fairly well.  [name] here today for evaluation before starting cycle #11.   MEDICAL HISTORY: Past Medical History:  Diagnosis Date  . Adenocarcinoma of right lung, stage 4 ([facility]) 01/24/2017  . Goals of care, counseling/discussion 01/24/2017  . Hypertension     ALLERGIES:  has No Known Allergies.  MEDICATIONS:  Current Outpatient Medications  Medication Sig Dispense Refill  . amLODipine (NORVASC) 5 MG tablet Take 1 tablet (5 mg total) by mouth daily. (Patient taking differently: Take 5 mg by mouth daily with lunch. ) 30 tablet 3  . Omega-3 Fatty Acids (FISH OIL) 1200 MG CAPS Take 1,200 mg by mouth every other day.    Marland Kitchen guaiFENesin (ROBITUSSIN) 100 MG/5ML SOLN Take 10 mLs by mouth at bedtime as needed for cough or to loosen phlegm.     No current facility-administered medications for this visit.     SURGICAL HISTORY:  Past Surgical History:  Procedure  Laterality Date  . CHEST TUBE INSERTION Right 02/06/2017   Procedure: INSERTION PLEURAL DRAINAGE CATHETER;  Surgeon: Ivin Poot, MD;  Location: [facility];  Service: Thoracic;  Laterality: Right;  . EYE SURGERY Right    metal removed from eye  . Hip replacement    . IR THORACENTESIS ASP PLEURAL SPACE W/IMG GUIDE  12/19/2016  . REMOVAL OF PLEURAL DRAINAGE CATHETER Right 03/21/2017   Procedure: REMOVAL OF RIGHT PLEURAL DRAINAGE CATHETER;  Surgeon: Ivin Poot, MD;  Location: [facility];  Service: Thoracic;  Laterality: Right;    REVIEW OF SYSTEMS:  A comprehensive review of systems was negative except for: Constitutional: positive for fatigue Respiratory: positive for dyspnea on exertion   PHYSICAL EXAMINATION: General appearance: alert, cooperative, fatigued and no distress Head: Normocephalic, without obvious abnormality, atraumatic Neck: no adenopathy, no JVD, supple, symmetrical, trachea midline and thyroid not enlarged, symmetric, no tenderness/mass/nodules Lymph nodes: Cervical, supraclavicular, and axillary nodes normal. Resp: clear to auscultation bilaterally Back: symmetric, no curvature. ROM normal. No CVA tenderness. Cardio: regular rate and rhythm, S1, S2 normal, no murmur, click, rub or gallop GI: soft, non-tender; bowel sounds normal; no masses,  no organomegaly Extremities: extremities normal, atraumatic, no cyanosis or edema  ECOG PERFORMANCE STATUS: 1 - Symptomatic but completely ambulatory  Blood pressure (!) 148/82, pulse 97, temperature 98.7 F (37.1 C), temperature source Oral, resp. rate 18, height '5\' 11"'$  (1.803 m), weight 154 lb 1.6 oz (69.9 kg), SpO2 92 %.  LABORATORY DATA: Lab Results  Component Value Date  WBC 4.5 10/19/2017   HGB 15.1 10/19/2017   HCT 47.1 10/19/2017   MCV 88.0 10/19/2017   PLT 192 10/19/2017      Chemistry      Component Value Date/Time   NA 141 09/26/2017 0833   NA 138 05/04/2017 1011   K 4.2 09/26/2017 0833   K 3.9 05/04/2017  1011   CL 102 09/26/2017 0833   CO2 30 (H) 09/26/2017 0833   CO2 24 05/04/2017 1011   BUN 7 09/26/2017 0833   BUN 18.4 05/04/2017 1011   CREATININE 0.83 09/26/2017 0833   CREATININE 1.0 05/04/2017 1011      Component Value Date/Time   CALCIUM 9.9 09/26/2017 0833   CALCIUM 9.3 05/04/2017 1011   ALKPHOS 79 09/26/2017 0833   ALKPHOS 91 05/04/2017 1011   AST 27 09/26/2017 0833   AST 23 05/04/2017 1011   ALT 16 09/26/2017 0833   ALT 19 05/04/2017 1011   BILITOT 0.4 09/26/2017 0833   BILITOT 0.39 05/04/2017 1011       RADIOGRAPHIC STUDIES: Ct Chest W Contrast  Result Date: 09/26/2017 CLINICAL DATA:  Patient with history of non small cell right lung cancer diagnosed 7/18. On chemotherapy. Follow-up evaluation EXAM: CT CHEST, ABDOMEN, AND PELVIS WITH CONTRAST TECHNIQUE: Multidetector CT imaging of the chest, abdomen and pelvis was performed following the standard protocol during bolus administration of intravenous contrast. CONTRAST:  154m ISOVUE-300 IOPAMIDOL (ISOVUE-300) INJECTION 61% COMPARISON:  CT CAP 07/26/2016. FINDINGS: CT CHEST FINDINGS Cardiovascular: Stable mild cardiomegaly. Trace fluid superior pericardial recess. Thoracic aortic vascular calcifications. Coronary arterial vascular calcifications. Mediastinum/Nodes: Stable subcentimeter right axillary lymph nodes. No left axillary adenopathy. Unchanged 1.2 cm right paratracheal lymph node (image 27; series 2). Unchanged 1.5 cm subcarinal lymph node (image 31; series 2). Lungs/Pleura: Central airways are patent. Multiple scattered pulmonary nodules are demonstrated throughout the left lung, many of which are mildly increased in size when compared to prior exam. Reference subpleural left lower lobe nodule measures 4 mm (image 100; series 4), previously 3 mm. Reference subpleural lingular nodule measures 5 mm (image 91; series 4), previously 4 mm. Reference subpleural left upper lobe nodule measures 5 mm (image 39; series), previously 2  mm. When compared to prior exam there has been interval increase in irregular soft tissue involving the peripheral right upper, right middle and right lower lobes. Mildly increased enhancing pleural-based nodularity throughout the right hemithorax. Persistent loculated moderate right pleural effusion. No pneumothorax. Increased consolidation within the right middle and right lower lobes. Musculoskeletal: Thoracic spine degenerative changes. No aggressive or acute appearing osseous lesions. CT ABDOMEN PELVIS FINDINGS Hepatobiliary: The liver is normal in size and contour. No focal hepatic lesion is identified. Gallbladder is decompressed. No intrahepatic or extrahepatic biliary ductal dilatation. Pancreas: Unremarkable Spleen: Unremarkable Adrenals/Urinary Tract: Normal appearance of the adrenal glands. Kidneys enhance symmetrically with contrast. No hydronephrosis. Urinary bladder is unremarkable. Stomach/Bowel: No abnormal bowel wall thickening or evidence for bowel obstruction. Normal morphology of the stomach. No free fluid or free intraperitoneal air. Vascular/Lymphatic: Re demonstrated extensive retroperitoneal and pelvic adenopathy. Overall, mildly increased in size when compared to prior. Reference right retrocaval lymph node measures 2.3 x 1.9 cm (image 70; series 2), previously 2.3 x 1.7 cm. Reference right periaortic lymph node measures 3.9 x 4.6 cm (image 85; series 2), previously 3.6 x 4.2 cm. Reference right pelvic sidewall lymph node measures 2.4 cm in thickness (image 105; series 2), previously 1.9 cm. Reproductive: Prostate is unremarkable. Other: Small fat containing right inguinal  hernia. Musculoskeletal: Right hip postsurgical changes. Left hip degenerative changes. Lumbar spine degenerative changes. No aggressive or acute appearing osseous lesions. IMPRESSION: 1. Interval increase in irregular consolidation throughout the peripheral aspect of the right lung. Increased consolidation throughout the  right lung. 2. Mild interval increase in size of multiple nodules within the left lung. 3. Interval increase in enhancing rind of soft tissue within the right hemithorax. 4. Similar-appearing mediastinal adenopathy. 5. Mild interval increase in size of retroperitoneal adenopathy within the abdomen. Electronically Signed   By: Lovey Newcomer M.D.   On: 09/26/2017 14:13   Ct Abdomen Pelvis W Contrast  Result Date: 09/26/2017 CLINICAL DATA:  Patient with history of non small cell right lung cancer diagnosed 7/18. On chemotherapy. Follow-up evaluation EXAM: CT CHEST, ABDOMEN, AND PELVIS WITH CONTRAST TECHNIQUE: Multidetector CT imaging of the chest, abdomen and pelvis was performed following the standard protocol during bolus administration of intravenous contrast. CONTRAST:  149m ISOVUE-300 IOPAMIDOL (ISOVUE-300) INJECTION 61% COMPARISON:  CT CAP 07/26/2016. FINDINGS: CT CHEST FINDINGS Cardiovascular: Stable mild cardiomegaly. Trace fluid superior pericardial recess. Thoracic aortic vascular calcifications. Coronary arterial vascular calcifications. Mediastinum/Nodes: Stable subcentimeter right axillary lymph nodes. No left axillary adenopathy. Unchanged 1.2 cm right paratracheal lymph node (image 27; series 2). Unchanged 1.5 cm subcarinal lymph node (image 31; series 2). Lungs/Pleura: Central airways are patent. Multiple scattered pulmonary nodules are demonstrated throughout the left lung, many of which are mildly increased in size when compared to prior exam. Reference subpleural left lower lobe nodule measures 4 mm (image 100; series 4), previously 3 mm. Reference subpleural lingular nodule measures 5 mm (image 91; series 4), previously 4 mm. Reference subpleural left upper lobe nodule measures 5 mm (image 39; series), previously 2 mm. When compared to prior exam there has been interval increase in irregular soft tissue involving the peripheral right upper, right middle and right lower lobes. Mildly increased  enhancing pleural-based nodularity throughout the right hemithorax. Persistent loculated moderate right pleural effusion. No pneumothorax. Increased consolidation within the right middle and right lower lobes. Musculoskeletal: Thoracic spine degenerative changes. No aggressive or acute appearing osseous lesions. CT ABDOMEN PELVIS FINDINGS Hepatobiliary: The liver is normal in size and contour. No focal hepatic lesion is identified. Gallbladder is decompressed. No intrahepatic or extrahepatic biliary ductal dilatation. Pancreas: Unremarkable Spleen: Unremarkable Adrenals/Urinary Tract: Normal appearance of the adrenal glands. Kidneys enhance symmetrically with contrast. No hydronephrosis. Urinary bladder is unremarkable. Stomach/Bowel: No abnormal bowel wall thickening or evidence for bowel obstruction. Normal morphology of the stomach. No free fluid or free intraperitoneal air. Vascular/Lymphatic: Re demonstrated extensive retroperitoneal and pelvic adenopathy. Overall, mildly increased in size when compared to prior. Reference right retrocaval lymph node measures 2.3 x 1.9 cm (image 70; series 2), previously 2.3 x 1.7 cm. Reference right periaortic lymph node measures 3.9 x 4.6 cm (image 85; series 2), previously 3.6 x 4.2 cm. Reference right pelvic sidewall lymph node measures 2.4 cm in thickness (image 105; series 2), previously 1.9 cm. Reproductive: Prostate is unremarkable. Other: Small fat containing right inguinal hernia. Musculoskeletal: Right hip postsurgical changes. Left hip degenerative changes. Lumbar spine degenerative changes. No aggressive or acute appearing osseous lesions. IMPRESSION: 1. Interval increase in irregular consolidation throughout the peripheral aspect of the right lung. Increased consolidation throughout the right lung. 2. Mild interval increase in size of multiple nodules within the left lung. 3. Interval increase in enhancing rind of soft tissue within the right hemithorax. 4.  Similar-appearing mediastinal adenopathy. 5. Mild interval increase in size  of retroperitoneal adenopathy within the abdomen. Electronically Signed   By: Lovey Newcomer M.D.   On: 09/26/2017 14:13    ASSESSMENT AND PLAN: This is a very pleasant 67 years old African-American male with a stage IV non-small cell lung cancer, adenocarcinoma with PDL 1 expression of 50% and negative for actionable mutations. The patient is currently undergoing treatment with immunotherapy with Keytruda 200 mg IV every 3 weeks status post 10 cycles.  He continues to tolerate this treatment well with no concerning complaints. I recommended for him to proceed with cycle #11 today as scheduled. He will come back for follow-up visit in 3 weeks for evaluation before the next cycle of his treatment. The patient was advised to call immediately if he has any concerning symptoms in the interval. The patient voices understanding of current disease status and treatment options and is in agreement with the current care plan. All questions were answered. The patient knows to call the clinic with any problems, questions or concerns. We can certainly see the patient much sooner if necessary.  Disclaimer: This note was dictated with voice recognition software. Similar sounding words can inadvertently be transcribed and may not be corrected upon review.

## 2017-10-19 NOTE — Telephone Encounter (Signed)
Scheduled appt per 5/30 los - patient to get an updated schedule in treatment area.

## 2017-10-19 NOTE — Patient Instructions (Signed)
[facility] Discharge Instructions for Patients Receiving Chemotherapy  Today you received the following chemotherapy agents :  Keytruda.  To help prevent nausea and vomiting after your treatment, we encourage you to take your nausea medication as prescribed.   If you develop nausea and vomiting that is not controlled by your nausea medication, call the clinic.   BELOW ARE SYMPTOMS THAT SHOULD BE REPORTED IMMEDIATELY:  *FEVER GREATER THAN 100.5 F  *CHILLS WITH OR WITHOUT FEVER  NAUSEA AND VOMITING THAT IS NOT CONTROLLED WITH YOUR NAUSEA MEDICATION  *UNUSUAL SHORTNESS OF BREATH  *UNUSUAL BRUISING OR BLEEDING  TENDERNESS IN MOUTH AND THROAT WITH OR WITHOUT PRESENCE OF ULCERS  *URINARY PROBLEMS  *BOWEL PROBLEMS  UNUSUAL RASH Items with * indicate a potential emergency and should be followed up as soon as possible.  Feel free to call the clinic should you have any questions or concerns. The clinic phone number is [phone].  Please show the CHEMO ALERT CARD at check-in to the Emergency Department and triage nurse.  

## 2017-10-19 NOTE — Patient Instructions (Signed)
Steps to Quit Smoking Smoking tobacco can be bad for your health. It can also affect almost every organ in your body. Smoking puts you and people around you at risk for many serious long-lasting (chronic) diseases. Quitting smoking is hard, but it is one of the best things that you can do for your health. It is never too late to quit. What are the benefits of quitting smoking? When you quit smoking, you lower your risk for getting serious diseases and conditions. They can include:  Lung cancer or lung disease.  Heart disease.  Stroke.  Heart attack.  Not being able to have children (infertility).  Weak bones (osteoporosis) and broken bones (fractures).  If you have coughing, wheezing, and shortness of breath, those symptoms may get better when you quit. You may also get sick less often. If you are pregnant, quitting smoking can help to lower your chances of having a baby of low birth weight. What can I do to help me quit smoking? Talk with your doctor about what can help you quit smoking. Some things you can do (strategies) include:  Quitting smoking totally, instead of slowly cutting back how much you smoke over a period of time.  Going to in-person counseling. You are more likely to quit if you go to many counseling sessions.  Using resources and support systems, such as: ? Online chats with a counselor. ? Phone quitlines. ? Printed self-help materials. ? Support groups or group counseling. ? Text messaging programs. ? Mobile phone apps or applications.  Taking medicines. Some of these medicines may have nicotine in them. If you are pregnant or breastfeeding, do not take any medicines to quit smoking unless your doctor says it is okay. Talk with your doctor about counseling or other things that can help you.  Talk with your doctor about using more than one strategy at the same time, such as taking medicines while you are also going to in-person counseling. This can help make  quitting easier. What things can I do to make it easier to quit? Quitting smoking might feel very hard at first, but there is a lot that you can do to make it easier. Take these steps:  Talk to your family and friends. Ask them to support and encourage you.  Call phone quitlines, reach out to support groups, or work with a counselor.  Ask people who smoke to not smoke around you.  Avoid places that make you want (trigger) to smoke, such as: ? Bars. ? Parties. ? Smoke-break areas at work.  Spend time with people who do not smoke.  Lower the stress in your life. Stress can make you want to smoke. Try these things to help your stress: ? Getting regular exercise. ? Deep-breathing exercises. ? Yoga. ? Meditating. ? Doing a body scan. To do this, close your eyes, focus on one area of your body at a time from head to toe, and notice which parts of your body are tense. Try to relax the muscles in those areas.  Download or buy apps on your mobile phone or tablet that can help you stick to your quit plan. There are many free apps, such as QuitGuide from the CDC (Centers for Disease Control and Prevention). You can find more support from smokefree.gov and other websites.  This information is not intended to replace advice given to you by your health care provider. Make sure you discuss any questions you have with your health care provider. Document Released: 03/05/2009 Document   Revised: 01/05/2016 Document Reviewed: 09/23/2014 Elsevier Interactive Patient Education  2018 Elsevier Inc.  

## 2017-11-09 ENCOUNTER — Inpatient Hospital Stay (HOSPITAL_BASED_OUTPATIENT_CLINIC_OR_DEPARTMENT_OTHER): Payer: Medicare Other | Admitting: Oncology

## 2017-11-09 ENCOUNTER — Other Ambulatory Visit: Payer: Medicare Other

## 2017-11-09 ENCOUNTER — Inpatient Hospital Stay: Payer: Medicare Other | Attending: Internal Medicine

## 2017-11-09 ENCOUNTER — Inpatient Hospital Stay: Payer: Medicare Other

## 2017-11-09 ENCOUNTER — Encounter: Payer: Self-pay | Admitting: Oncology

## 2017-11-09 ENCOUNTER — Telehealth: Payer: Self-pay | Admitting: Oncology

## 2017-11-09 ENCOUNTER — Ambulatory Visit: Payer: Medicare Other

## 2017-11-09 VITALS — BP 130/76 | HR 90 | Temp 98.5°F | Resp 18 | Ht 71.0 in | Wt 154.8 lb

## 2017-11-09 DIAGNOSIS — C3411 Malignant neoplasm of upper lobe, right bronchus or lung: Secondary | ICD-10-CM | POA: Insufficient documentation

## 2017-11-09 DIAGNOSIS — I10 Essential (primary) hypertension: Secondary | ICD-10-CM | POA: Insufficient documentation

## 2017-11-09 DIAGNOSIS — C3491 Malignant neoplasm of unspecified part of right bronchus or lung: Secondary | ICD-10-CM

## 2017-11-09 DIAGNOSIS — Z5112 Encounter for antineoplastic immunotherapy: Secondary | ICD-10-CM | POA: Insufficient documentation

## 2017-11-09 DIAGNOSIS — R0609 Other forms of dyspnea: Secondary | ICD-10-CM

## 2017-11-09 DIAGNOSIS — Z79899 Other long term (current) drug therapy: Secondary | ICD-10-CM

## 2017-11-09 LAB — COMPREHENSIVE METABOLIC PANEL
ALT: 10 U/L (ref 0–55)
ANION GAP: 8 (ref 3–11)
AST: 21 U/L (ref 5–34)
Albumin: 3.1 g/dL — ABNORMAL LOW (ref 3.5–5.0)
Alkaline Phosphatase: 71 U/L (ref 40–150)
BUN: 14 mg/dL (ref 7–26)
CHLORIDE: 104 mmol/L (ref 98–109)
CO2: 26 mmol/L (ref 22–29)
Calcium: 9.2 mg/dL (ref 8.4–10.4)
Creatinine, Ser: 1.07 mg/dL (ref 0.70–1.30)
GFR calc non Af Amer: >60 mL/min (ref >60)
Glucose, Bld: 119 mg/dL (ref 70–140)
POTASSIUM: 4 mmol/L (ref 3.5–5.1)
SODIUM: 138 mmol/L (ref 136–145)
Total Bilirubin: 0.2 mg/dL (ref 0.2–1.2)
Total Protein: 7.1 g/dL (ref 6.4–8.3)

## 2017-11-09 LAB — CBC WITH DIFFERENTIAL/PLATELET
Basophils Absolute: 0 10*3/uL (ref 0.0–0.1)
Basophils Relative: 0 %
Eosinophils Absolute: 0.1 10*3/uL (ref 0.0–0.5)
Eosinophils Relative: 3 %
HEMATOCRIT: 46.2 % (ref 38.4–49.9)
HEMOGLOBIN: 14.6 g/dL (ref 13.0–17.1)
LYMPHS ABS: 1.9 10*3/uL (ref 0.9–3.3)
Lymphocytes Relative: 40 %
MCH: 27.7 pg (ref 27.2–33.4)
MCHC: 31.6 g/dL — AB (ref 32.0–36.0)
MCV: 87.5 fL (ref 79.3–98.0)
MONOS PCT: 11 %
Monocytes Absolute: 0.5 10*3/uL (ref 0.1–0.9)
NEUTROS ABS: 2.2 10*3/uL (ref 1.5–6.5)
NEUTROS PCT: 46 %
Platelets: 187 10*3/uL (ref 140–400)
RBC: 5.28 MIL/uL (ref 4.20–5.82)
RDW: 16.4 % — ABNORMAL HIGH (ref 11.0–14.6)
WBC: 4.8 10*3/uL (ref 4.0–10.3)

## 2017-11-09 LAB — TSH: TSH: 2.021 u[IU]/mL (ref 0.320–4.118)

## 2017-11-09 MED ORDER — SODIUM CHLORIDE 0.9% FLUSH
10.0000 mL | INTRAVENOUS | Status: DC | PRN
  Filled 2017-11-09: qty 10

## 2017-11-09 MED ORDER — SODIUM CHLORIDE 0.9 % IV SOLN
Freq: Once | INTRAVENOUS | Status: AC
  Administered 2017-11-09: 11:00:00 via INTRAVENOUS

## 2017-11-09 MED ORDER — SODIUM CHLORIDE 0.9 % IV SOLN
200.0000 mg | Freq: Once | INTRAVENOUS | Status: AC
  Administered 2017-11-09: 200 mg via INTRAVENOUS
  Filled 2017-11-09: qty 8

## 2017-11-09 NOTE — Assessment & Plan Note (Signed)
This is a very pleasant 67 year old African-American male with a stage IV non-small cell lung cancer, adenocarcinoma with PDL 1 expression of 50% and negative for actionable mutations. The patient is currently undergoing treatment with immunotherapy with Keytruda 200 mg IV every 3 weeks status post 11 cycles.  He continues to tolerate this treatment well with no concerning complaints. I recommended for him to proceed with cycle #12 today as scheduled. The patient will have a restaging CT scan of the chest, abdomen, pelvis prior to his next visit. He will come back for follow-up visit in 3 weeks for evaluation before the next cycle of his treatment and to review his restaging CT scan results.  The patient was advised to call immediately if he has any concerning symptoms in the interval. The patient voices understanding of current disease status and treatment options and is in agreement with the current care plan. All questions were answered. The patient knows to call the clinic with any problems, questions or concerns. We can certainly see the patient much sooner if necessary.

## 2017-11-09 NOTE — Progress Notes (Signed)
[facility] OFFICE PROGRESS NOTE  Curt Bears, MD [address] [place] [address]  DIAGNOSIS: Stage IV (T3, N1, M1a) non-small cell lung cancer, adenocarcinoma diagnosed in July 2018 and presented with a right hilar lymphadenopathy and malignant right pleural effusion diagnosed in July 2018. Molecular studies were negative for EGFR, ALK, ROS 1 and BRAF mutations. PDL 1 expression was 50%  PRIOR THERAPY: None  CURRENT THERAPY: Keytruda (pembrolizumab) 200 mg IV every 3 weeks, status post 11 cycles.  INTERVAL HISTORY: Bradley Maldonado 67 y.o. male returns for routine follow-up visit accompanied by his aunt.  The patient is feeling fine today with no specific complaints.  He denies fevers and chills.  Denies chest pain, shortness of breath at rest, cough, hemoptysis.  He has baseline shortness of breath with exertion.  Denies nausea, vomiting, constipation, diarrhea.  Denies recent weight loss or night sweats.  Denies skin rashes.  The patient continues to tolerate treatment with [facility] fairly well.  The patient is here for evaluation prior to starting cycle #12 his treatment.  MEDICAL HISTORY: Past Medical History:  Diagnosis Date  . Adenocarcinoma of right lung, stage 4 ([facility]) 01/24/2017  . Goals of care, counseling/discussion 01/24/2017  . Hypertension     ALLERGIES:  has No Known Allergies.  MEDICATIONS:  Current Outpatient Medications  Medication Sig Dispense Refill  . amLODipine (NORVASC) 5 MG tablet Take 1 tablet (5 mg total) by mouth daily. (Patient not taking: Reported on 10/19/2017) 30 tablet 3  . guaiFENesin (ROBITUSSIN) 100 MG/5ML SOLN Take 10 mLs by mouth at bedtime as needed for cough or to loosen phlegm.    . Omega-3 Fatty Acids (FISH OIL) 1200 MG CAPS Take 1,200 mg by mouth every other day.     No current facility-administered medications for this visit.     SURGICAL HISTORY:  Past Surgical History:  Procedure Laterality Date  .  CHEST TUBE INSERTION Right 02/06/2017   Procedure: INSERTION PLEURAL DRAINAGE CATHETER;  Surgeon: Ivin Poot, MD;  Location: [facility];  Service: Thoracic;  Laterality: Right;  . EYE SURGERY Right    metal removed from eye  . Hip replacement    . IR THORACENTESIS ASP PLEURAL SPACE W/IMG GUIDE  12/19/2016  . REMOVAL OF PLEURAL DRAINAGE CATHETER Right 03/21/2017   Procedure: REMOVAL OF RIGHT PLEURAL DRAINAGE CATHETER;  Surgeon: Ivin Poot, MD;  Location: [facility];  Service: Thoracic;  Laterality: Right;    REVIEW OF SYSTEMS:   Review of Systems  Constitutional: Negative for appetite change, chills, fatigue, fever and unexpected weight change.  HENT:   Negative for mouth sores, nosebleeds, sore throat and trouble swallowing.   Eyes: Negative for eye problems and icterus.  Respiratory: Negative for cough, hemoptysis, shortness of breath at rest and wheezing.  Positive for shortness of breath with exertion. Cardiovascular: Negative for chest pain and leg swelling.  Gastrointestinal: Negative for abdominal pain, constipation, diarrhea, nausea and vomiting.  Genitourinary: Negative for bladder incontinence, difficulty urinating, dysuria, frequency and hematuria.   Musculoskeletal: Negative for back pain, gait problem, neck pain and neck stiffness.  Skin: Negative for itching and rash.  Neurological: Negative for dizziness, extremity weakness, gait problem, headaches, light-headedness and seizures.  Hematological: Negative for adenopathy. Does not bruise/bleed easily.  Psychiatric/Behavioral: Negative for confusion, depression and sleep disturbance. The patient is not nervous/anxious.     PHYSICAL EXAMINATION:  Blood pressure 130/76, pulse 90, temperature 98.5 F (36.9 C), temperature source Oral, resp. rate 18, height  $'5\' 11"'A$  (1.803 m), weight 154 lb 12.8 oz (70.2 kg), SpO2 97 %.  ECOG PERFORMANCE STATUS: 1 - Symptomatic but completely ambulatory  Physical Exam  Constitutional: Oriented  to person, place, and time and well-developed, well-nourished, and in no distress. No distress.  HENT:  Head: Normocephalic and atraumatic.  Mouth/Throat: Oropharynx is clear and moist. No oropharyngeal exudate.  Eyes: Conjunctivae are normal. Right eye exhibits no discharge. Left eye exhibits no discharge. No scleral icterus.  Neck: Normal range of motion. Neck supple.  Cardiovascular: Normal rate, regular rhythm, normal heart sounds and intact distal pulses.   Pulmonary/Chest: Effort normal and breath sounds normal. No respiratory distress. No wheezes. No rales.  Abdominal: Soft. Bowel sounds are normal. Exhibits no distension and no mass. There is no tenderness.  Musculoskeletal: Normal range of motion. Exhibits no edema.  Lymphadenopathy:    No cervical adenopathy.  Neurological: Alert and oriented to person, place, and time. Exhibits normal muscle tone. Gait normal. Coordination normal.  Skin: Skin is warm and dry. No rash noted. Not diaphoretic. No erythema. No pallor.  Psychiatric: Mood, memory and judgment normal.  Vitals reviewed.  LABORATORY DATA: Lab Results  Component Value Date   WBC 4.8 11/09/2017   HGB 14.6 11/09/2017   HCT 46.2 11/09/2017   MCV 87.5 11/09/2017   PLT 187 11/09/2017      Chemistry      Component Value Date/Time   NA 142 10/19/2017 0923   NA 138 05/04/2017 1011   K 3.8 10/19/2017 0923   K 3.9 05/04/2017 1011   CL 104 10/19/2017 0923   CO2 27 10/19/2017 0923   CO2 24 05/04/2017 1011   BUN 10 10/19/2017 0923   BUN 18.4 05/04/2017 1011   CREATININE 0.84 10/19/2017 0923   CREATININE 1.0 05/04/2017 1011      Component Value Date/Time   CALCIUM 9.1 10/19/2017 0923   CALCIUM 9.3 05/04/2017 1011   ALKPHOS 74 10/19/2017 0923   ALKPHOS 91 05/04/2017 1011   AST 20 10/19/2017 0923   AST 23 05/04/2017 1011   ALT 12 10/19/2017 0923   ALT 19 05/04/2017 1011   BILITOT 0.3 10/19/2017 0923   BILITOT 0.39 05/04/2017 1011       RADIOGRAPHIC  STUDIES:  No results found.   ASSESSMENT/PLAN:  Adenocarcinoma of right lung, stage 4 ([facility]) This is a very pleasant 67 year old African-American male with a stage IV non-small cell lung cancer, adenocarcinoma with PDL 1 expression of 50% and negative for actionable mutations. The patient is currently undergoing treatment with immunotherapy with Keytruda 200 mg IV every 3 weeks status post 11 cycles.  He continues to tolerate this treatment well with no concerning complaints. I recommended for him to proceed with cycle #12 today as scheduled. The patient will have a restaging CT scan of the chest, abdomen, pelvis prior to his next visit. He will come back for follow-up visit in 3 weeks for evaluation before the next cycle of his treatment and to review his restaging CT scan results.  The patient was advised to call immediately if he has any concerning symptoms in the interval. The patient voices understanding of current disease status and treatment options and is in agreement with the current care plan. All questions were answered. The patient knows to call the clinic with any problems, questions or concerns. We can certainly see the patient much sooner if necessary.   Orders Placed This Encounter  Procedures  . CT ABDOMEN PELVIS W CONTRAST  Standing Status:   Future    Standing Expiration Date:   11/10/2018    Order Specific Question:   If indicated for the ordered procedure, I authorize the administration of contrast media per Radiology protocol    Answer:   Yes    Order Specific Question:   Preferred imaging location?    Answer:   [facility]    Order Specific Question:   Radiology Contrast Protocol - do NOT remove file path    Answer:   [email or web address]    Order Specific Question:   ** REASON FOR EXAM (FREE TEXT)    Answer:   Lung cancer. Restaging.  . CT CHEST W CONTRAST    Standing Status:   Future    Standing Expiration Date:   11/10/2018     Order Specific Question:   If indicated for the ordered procedure, I authorize the administration of contrast media per Radiology protocol    Answer:   Yes    Order Specific Question:   Preferred imaging location?    Answer:   [facility]    Order Specific Question:   Radiology Contrast Protocol - do NOT remove file path    Answer:   [email or web address]    Order Specific Question:   ** REASON FOR EXAM (FREE TEXT)    Answer:   Lung cancer. Restaging.   Mikey Bussing, DNP, AGPCNP-BC, AOCNP 11/09/17

## 2017-11-09 NOTE — Telephone Encounter (Signed)
Bradley Maldonado patient - pt already scheduled per 6/20 los.

## 2017-11-28 ENCOUNTER — Encounter (HOSPITAL_COMMUNITY): Payer: Self-pay

## 2017-11-28 ENCOUNTER — Ambulatory Visit (HOSPITAL_COMMUNITY)
Admission: RE | Admit: 2017-11-28 | Discharge: 2017-11-28 | Disposition: A | Discharge status: Home / Self Care | Payer: Medicare Other | Source: Ambulatory Visit | Attending: Oncology | Admitting: Oncology

## 2017-11-28 DIAGNOSIS — C3490 Malignant neoplasm of unspecified part of unspecified bronchus or lung: Secondary | ICD-10-CM | POA: Diagnosis not present

## 2017-11-28 DIAGNOSIS — R590 Localized enlarged lymph nodes: Secondary | ICD-10-CM | POA: Diagnosis not present

## 2017-11-28 DIAGNOSIS — C3491 Malignant neoplasm of unspecified part of right bronchus or lung: Secondary | ICD-10-CM | POA: Insufficient documentation

## 2017-11-28 MED ORDER — IOPAMIDOL (ISOVUE-300) INJECTION 61%
INTRAVENOUS | Status: AC
  Filled 2017-11-28: qty 100

## 2017-11-28 MED ORDER — IOPAMIDOL (ISOVUE-300) INJECTION 61%
100.0000 mL | Freq: Once | INTRAVENOUS | Status: AC | PRN
  Administered 2017-11-28: 100 mL via INTRAVENOUS

## 2017-11-30 ENCOUNTER — Inpatient Hospital Stay: Payer: Medicare Other

## 2017-11-30 ENCOUNTER — Inpatient Hospital Stay: Payer: Medicare Other | Attending: Internal Medicine

## 2017-11-30 ENCOUNTER — Inpatient Hospital Stay (HOSPITAL_BASED_OUTPATIENT_CLINIC_OR_DEPARTMENT_OTHER): Payer: Medicare Other | Admitting: Internal Medicine

## 2017-11-30 ENCOUNTER — Encounter: Payer: Self-pay | Admitting: Internal Medicine

## 2017-11-30 ENCOUNTER — Telehealth: Payer: Self-pay

## 2017-11-30 VITALS — BP 134/75 | HR 96 | Temp 98.3°F | Resp 17 | Ht 71.0 in | Wt 150.9 lb

## 2017-11-30 DIAGNOSIS — R0609 Other forms of dyspnea: Secondary | ICD-10-CM

## 2017-11-30 DIAGNOSIS — Z5112 Encounter for antineoplastic immunotherapy: Secondary | ICD-10-CM | POA: Insufficient documentation

## 2017-11-30 DIAGNOSIS — C3411 Malignant neoplasm of upper lobe, right bronchus or lung: Secondary | ICD-10-CM | POA: Insufficient documentation

## 2017-11-30 DIAGNOSIS — I10 Essential (primary) hypertension: Secondary | ICD-10-CM | POA: Insufficient documentation

## 2017-11-30 DIAGNOSIS — Z79899 Other long term (current) drug therapy: Secondary | ICD-10-CM | POA: Diagnosis not present

## 2017-11-30 DIAGNOSIS — C3491 Malignant neoplasm of unspecified part of right bronchus or lung: Secondary | ICD-10-CM

## 2017-11-30 DIAGNOSIS — C7802 Secondary malignant neoplasm of left lung: Secondary | ICD-10-CM

## 2017-11-30 DIAGNOSIS — J910 Malignant pleural effusion: Secondary | ICD-10-CM | POA: Insufficient documentation

## 2017-11-30 LAB — COMPREHENSIVE METABOLIC PANEL
ALT: 15 U/L (ref 0–44)
AST: 22 U/L (ref 15–41)
Albumin: 3.2 g/dL — ABNORMAL LOW (ref 3.5–5.0)
Alkaline Phosphatase: 77 U/L (ref 38–126)
Anion gap: 10 (ref 5–15)
BUN: 9 mg/dL (ref 8–23)
CHLORIDE: 103 mmol/L (ref 98–111)
CO2: 28 mmol/L (ref 22–32)
CREATININE: 0.81 mg/dL (ref 0.61–1.24)
Calcium: 9.3 mg/dL (ref 8.9–10.3)
GFR calc Af Amer: >60 mL/min (ref >60)
Glucose, Bld: 118 mg/dL — ABNORMAL HIGH (ref 70–99)
POTASSIUM: 4.1 mmol/L (ref 3.5–5.1)
SODIUM: 141 mmol/L (ref 135–145)
Total Bilirubin: 0.4 mg/dL (ref 0.3–1.2)
Total Protein: 7.3 g/dL (ref 6.5–8.1)

## 2017-11-30 LAB — CBC WITH DIFFERENTIAL/PLATELET
Basophils Absolute: 0 10*3/uL (ref 0.0–0.1)
Basophils Relative: 0 %
EOS ABS: 0.1 10*3/uL (ref 0.0–0.5)
Eosinophils Relative: 2 %
HEMATOCRIT: 48.5 % (ref 38.4–49.9)
HEMOGLOBIN: 15.6 g/dL (ref 13.0–17.1)
LYMPHS ABS: 1.7 10*3/uL (ref 0.9–3.3)
LYMPHS PCT: 38 %
MCH: 27.7 pg (ref 27.2–33.4)
MCHC: 32.2 g/dL (ref 32.0–36.0)
MCV: 86 fL (ref 79.3–98.0)
MONOS PCT: 12 %
Monocytes Absolute: 0.6 10*3/uL (ref 0.1–0.9)
NEUTROS ABS: 2.1 10*3/uL (ref 1.5–6.5)
NEUTROS PCT: 48 %
Platelets: 224 10*3/uL (ref 140–400)
RBC: 5.64 MIL/uL (ref 4.20–5.82)
RDW: 17 % — ABNORMAL HIGH (ref 11.0–14.6)
WBC: 4.5 10*3/uL (ref 4.0–10.3)

## 2017-11-30 LAB — TSH: TSH: 1.859 u[IU]/mL (ref 0.320–4.118)

## 2017-11-30 MED ORDER — SODIUM CHLORIDE 0.9 % IV SOLN
Freq: Once | INTRAVENOUS | Status: AC
  Administered 2017-11-30: 10:00:00 via INTRAVENOUS

## 2017-11-30 MED ORDER — SODIUM CHLORIDE 0.9 % IV SOLN
200.0000 mg | Freq: Once | INTRAVENOUS | Status: AC
  Administered 2017-11-30: 200 mg via INTRAVENOUS
  Filled 2017-11-30: qty 8

## 2017-11-30 NOTE — Progress Notes (Signed)
[facility] [phone]   Fax:([phone]) [place], [place] [address]  DIAGNOSIS: Stage IV (T3, N1, M1a) non-small cell lung cancer, adenocarcinoma diagnosed in July 2018 and presented with a right hilar lymphadenopathy and malignant right pleural effusion diagnosed in July 2018. Molecular studies were negative for EGFR, ALK, ROS 1 and BRAF mutations. PDL 1 expression was 50%  PRIOR THERAPY: None  CURRENT THERAPY: Ketruda (pembrolizumab) 200 mg IV every 3 weeks, status post 12 cycles.  INTERVAL HISTORY: Bradley Maldonado 67 y.o. male returns to the clinic today for follow-up visit accompanied by his aunt.  The patient is feeling fine today with no specific complaints.  He continues to tolerate his treatment with Keytruda fairly well.  He denied having any chest pain but has shortness of breath with exertion with no cough or hemoptysis.  He denied having any recent nausea, vomiting, diarrhea or constipation.  He lost few pounds since his last visit.  He has no headache or visual changes.  The patient had repeat CT scan of the chest, abdomen and pelvis performed recently and he is here for evaluation and discussion of his discuss results.  MEDICAL HISTORY: Past Medical History:  Diagnosis Date  . Adenocarcinoma of right lung, stage 4 ([facility]) 01/24/2017  . Goals of care, counseling/discussion 01/24/2017  . Hypertension     ALLERGIES:  has No Known Allergies.  MEDICATIONS:  Current Outpatient Medications  Medication Sig Dispense Refill  . amLODipine (NORVASC) 5 MG tablet Take 1 tablet (5 mg total) by mouth daily. (Patient not taking: Reported on 10/19/2017) 30 tablet 3  . guaiFENesin (ROBITUSSIN) 100 MG/5ML SOLN Take 10 mLs by mouth at bedtime as needed for cough or to loosen phlegm.    . Omega-3 Fatty Acids (FISH OIL) 1200 MG CAPS Take 1,200 mg by mouth every other day.     No current  facility-administered medications for this visit.     SURGICAL HISTORY:  Past Surgical History:  Procedure Laterality Date  . CHEST TUBE INSERTION Right 02/06/2017   Procedure: INSERTION PLEURAL DRAINAGE CATHETER;  Surgeon: Ivin Poot, MD;  Location: [facility];  Service: Thoracic;  Laterality: Right;  . EYE SURGERY Right    metal removed from eye  . Hip replacement    . IR THORACENTESIS ASP PLEURAL SPACE W/IMG GUIDE  12/19/2016  . REMOVAL OF PLEURAL DRAINAGE CATHETER Right 03/21/2017   Procedure: REMOVAL OF RIGHT PLEURAL DRAINAGE CATHETER;  Surgeon: Ivin Poot, MD;  Location: [facility];  Service: Thoracic;  Laterality: Right;    REVIEW OF SYSTEMS:  Constitutional: negative Eyes: negative Ears, nose, mouth, throat, and face: negative Respiratory: positive for dyspnea on exertion Cardiovascular: negative Gastrointestinal: negative Genitourinary:negative Integument/breast: negative Hematologic/lymphatic: negative Musculoskeletal:negative Neurological: negative Behavioral/Psych: negative Endocrine: negative Allergic/Immunologic: negative   PHYSICAL EXAMINATION: General appearance: alert, cooperative and no distress Head: Normocephalic, without obvious abnormality, atraumatic Neck: no adenopathy, no JVD, supple, symmetrical, trachea midline and thyroid not enlarged, symmetric, no tenderness/mass/nodules Lymph nodes: Cervical, supraclavicular, and axillary nodes normal. Resp: clear to auscultation bilaterally Back: symmetric, no curvature. ROM normal. No CVA tenderness. Cardio: regular rate and rhythm, S1, S2 normal, no murmur, click, rub or gallop GI: soft, non-tender; bowel sounds normal; no masses,  no organomegaly Extremities: extremities normal, atraumatic, no cyanosis or edema Neurologic: Alert and oriented X 3, normal strength and tone. Normal symmetric reflexes. Normal coordination and gait  ECOG PERFORMANCE  STATUS: 1 - Symptomatic but completely ambulatory  Blood  pressure 134/75, pulse 96, temperature 98.3 F (36.8 C), temperature source Oral, resp. rate 17, height '5\' 11"'$  (1.803 m), weight 150 lb 14.4 oz (68.4 kg).  LABORATORY DATA: Lab Results  Component Value Date   WBC 4.5 11/30/2017   HGB 15.6 11/30/2017   HCT 48.5 11/30/2017   MCV 86.0 11/30/2017   PLT 224 11/30/2017      Chemistry      Component Value Date/Time   NA 138 11/09/2017 0956   NA 138 05/04/2017 1011   K 4.0 11/09/2017 0956   K 3.9 05/04/2017 1011   CL 104 11/09/2017 0956   CO2 26 11/09/2017 0956   CO2 24 05/04/2017 1011   BUN 14 11/09/2017 0956   BUN 18.4 05/04/2017 1011   CREATININE 1.07 11/09/2017 0956   CREATININE 1.0 05/04/2017 1011      Component Value Date/Time   CALCIUM 9.2 11/09/2017 0956   CALCIUM 9.3 05/04/2017 1011   ALKPHOS 71 11/09/2017 0956   ALKPHOS 91 05/04/2017 1011   AST 21 11/09/2017 0956   AST 23 05/04/2017 1011   ALT 10 11/09/2017 0956   ALT 19 05/04/2017 1011   BILITOT 0.2 11/09/2017 0956   BILITOT 0.39 05/04/2017 1011       RADIOGRAPHIC STUDIES: Ct Chest W Contrast  Result Date: 11/28/2017 CLINICAL DATA:  Metastatic adenocarcinoma of the lung. Immunotherapy in progress. EXAM: CT CHEST, ABDOMEN, AND PELVIS WITH CONTRAST TECHNIQUE: Multidetector CT imaging of the chest, abdomen and pelvis was performed following the standard protocol during bolus administration of intravenous contrast. CONTRAST:  177m ISOVUE-300 IOPAMIDOL (ISOVUE-300) INJECTION 61% COMPARISON:  CT scan 09/26/2017 and 07/26/2017 FINDINGS: CT CHEST FINDINGS Cardiovascular: The heart is normal in size. No pericardial effusion. The aorta is normal in caliber. Stable atherosclerotic calcifications. No dissection. The branch vessels are patent. Stable three-vessel coronary artery calcifications. Mediastinum/Nodes: Stable appearing right suprahilar tumor surrounding a branch of the right upper lobe pulmonary artery. This measures 27 mm on image number 30 and measured 24.5 mm on the  prior exam. 8.5 mm aorticopulmonary window lymph node on image number 30 is unchanged. Extensive low-attenuation necrotic appearing subcarinal adenopathy measuring 17 mm on image number 37 and previously measuring 15 mm. Lungs/Pleura: Extensive pleural tumor involving the right hemithorax appears relatively stable. There is also extensive tumor in the lung with large area of consolidation on image number 35 measuring 6.6 cm which appears stable. Extensive interstitial spread of tumor throughout the right lung. Persistent loculated pleural fluid collection at the right lung base. Stable 8 mm epicardial lymph node on image number 55. Numerous metastatic pulmonary nodules are noted in the left lung. Slight interval enlargement of the pulmonary nodules. Peripheral left lower lobe nodule on image number 102 measures 6.5 mm and previously measured 5 mm. 6 mm nodule in the left upper lobe adjacent to the major fissure on image 77 previously measured 5 mm. Several other pulmonary nodules are slightly larger. Musculoskeletal: No definite osseous metastatic disease. Stable enhancing metastatic right axillary lymph nodes. 11 mm node on image number 20 is stable and 9.5 mm node on image number 27 is stable. Lower right axillary lymph node on image number 36 measures 9.5 mm and previously measured 8.5 mm. CT ABDOMEN PELVIS FINDINGS Hepatobiliary: No hepatic lesions to suggest metastatic disease. The gallbladder is normal. No intra or extrahepatic biliary dilatation. Pancreas: No mass, inflammation or ductal dilatation. Spleen: Normal size.  No focal lesions. Adrenals/Urinary Tract:  The adrenal glands are unremarkable and stable. No worrisome renal lesions. Stomach/Bowel: The stomach, duodenum, small bowel and colon are grossly normal. Vascular/Lymphatic: Right-sided retroperitoneal adenopathy is again demonstrated. 16 mm node deep to the IVC on image number 81 previously measured 12.5 mm. Large nodal mass at the level of the  right iliac crest on image number 89 measures 5.0 x 4.0 cm and previously measured 4.5 x 3.7 cm. Bulky right-sided pelvic adenopathy appears relatively stable. Nodal lesion on image number 104 measures 4.0 x 2.1 cm and is unchanged. Nodal lesion on image number 111 measures 4.3 x 2.1 cm and is stable. Small scattered inguinal nodes are stable. Reproductive: The prostate gland and seminal vesicles are grossly normal. Other: No inguinal hernia or subcutaneous lesions. Musculoskeletal: Artifact associated with the right hip hardware. Severe left hip joint degenerative changes. No obvious metastatic bone disease. IMPRESSION: 1. Overall the right lung appears relatively stable. There is extensive pleural and parenchymal tumor throughout the right hemithorax with diffuse interstitial spread of tumor. Stable loculated pleural fluid collection at the right lung base. 2. Overall stable right hilar and mediastinal lymphadenopathy. 3. Slight progression of pulmonary metastatic disease involving the left lung. 4. Right-sided retroperitoneal adenopathy is slightly progressive but the right-sided pelvic adenopathy appears stable. 5. Stable right axillary lymphadenopathy. Electronically Signed   By: Marijo Sanes M.D.   On: 11/28/2017 10:52   Ct Abdomen Pelvis W Contrast  Result Date: 11/28/2017 CLINICAL DATA:  Metastatic adenocarcinoma of the lung. Immunotherapy in progress. EXAM: CT CHEST, ABDOMEN, AND PELVIS WITH CONTRAST TECHNIQUE: Multidetector CT imaging of the chest, abdomen and pelvis was performed following the standard protocol during bolus administration of intravenous contrast. CONTRAST:  158m ISOVUE-300 IOPAMIDOL (ISOVUE-300) INJECTION 61% COMPARISON:  CT scan 09/26/2017 and 07/26/2017 FINDINGS: CT CHEST FINDINGS Cardiovascular: The heart is normal in size. No pericardial effusion. The aorta is normal in caliber. Stable atherosclerotic calcifications. No dissection. The branch vessels are patent. Stable  three-vessel coronary artery calcifications. Mediastinum/Nodes: Stable appearing right suprahilar tumor surrounding a branch of the right upper lobe pulmonary artery. This measures 27 mm on image number 30 and measured 24.5 mm on the prior exam. 8.5 mm aorticopulmonary window lymph node on image number 30 is unchanged. Extensive low-attenuation necrotic appearing subcarinal adenopathy measuring 17 mm on image number 37 and previously measuring 15 mm. Lungs/Pleura: Extensive pleural tumor involving the right hemithorax appears relatively stable. There is also extensive tumor in the lung with large area of consolidation on image number 35 measuring 6.6 cm which appears stable. Extensive interstitial spread of tumor throughout the right lung. Persistent loculated pleural fluid collection at the right lung base. Stable 8 mm epicardial lymph node on image number 55. Numerous metastatic pulmonary nodules are noted in the left lung. Slight interval enlargement of the pulmonary nodules. Peripheral left lower lobe nodule on image number 102 measures 6.5 mm and previously measured 5 mm. 6 mm nodule in the left upper lobe adjacent to the major fissure on image 77 previously measured 5 mm. Several other pulmonary nodules are slightly larger. Musculoskeletal: No definite osseous metastatic disease. Stable enhancing metastatic right axillary lymph nodes. 11 mm node on image number 20 is stable and 9.5 mm node on image number 27 is stable. Lower right axillary lymph node on image number 36 measures 9.5 mm and previously measured 8.5 mm. CT ABDOMEN PELVIS FINDINGS Hepatobiliary: No hepatic lesions to suggest metastatic disease. The gallbladder is normal. No intra or extrahepatic biliary dilatation. Pancreas: No mass,  inflammation or ductal dilatation. Spleen: Normal size.  No focal lesions. Adrenals/Urinary Tract: The adrenal glands are unremarkable and stable. No worrisome renal lesions. Stomach/Bowel: The stomach, duodenum, small  bowel and colon are grossly normal. Vascular/Lymphatic: Right-sided retroperitoneal adenopathy is again demonstrated. 16 mm node deep to the IVC on image number 81 previously measured 12.5 mm. Large nodal mass at the level of the right iliac crest on image number 89 measures 5.0 x 4.0 cm and previously measured 4.5 x 3.7 cm. Bulky right-sided pelvic adenopathy appears relatively stable. Nodal lesion on image number 104 measures 4.0 x 2.1 cm and is unchanged. Nodal lesion on image number 111 measures 4.3 x 2.1 cm and is stable. Small scattered inguinal nodes are stable. Reproductive: The prostate gland and seminal vesicles are grossly normal. Other: No inguinal hernia or subcutaneous lesions. Musculoskeletal: Artifact associated with the right hip hardware. Severe left hip joint degenerative changes. No obvious metastatic bone disease. IMPRESSION: 1. Overall the right lung appears relatively stable. There is extensive pleural and parenchymal tumor throughout the right hemithorax with diffuse interstitial spread of tumor. Stable loculated pleural fluid collection at the right lung base. 2. Overall stable right hilar and mediastinal lymphadenopathy. 3. Slight progression of pulmonary metastatic disease involving the left lung. 4. Right-sided retroperitoneal adenopathy is slightly progressive but the right-sided pelvic adenopathy appears stable. 5. Stable right axillary lymphadenopathy. Electronically Signed   By: Marijo Sanes M.D.   On: 11/28/2017 10:52    ASSESSMENT AND PLAN: This is a very pleasant 67 years old African-American male with a stage IV non-small cell lung cancer, adenocarcinoma with PDL 1 expression of 50% and negative for actionable mutations. The patient is currently undergoing treatment with immunotherapy with Keytruda 200 mg IV every 3 weeks status post 12 cycles.  The patient continues to tolerate this treatment well with no concerning complaints.  Repeat CT scan of the chest, abdomen and  pelvis showed stable disease with a slight progression of some of the pulmonary nodules. I personally and independently reviewed the scan and discussed the results with the patient and his family member. I recommended for him to continue his current treatment with Keytruda.  He will start cycle #13 today. The patient will come back for follow-up visit in 3 weeks for evaluation before the next cycle of his treatment. For hypertension, he is scheduled an appointment with the primary care physician for addressing this issue. The patient was advised to call immediately if he has any concerning symptoms in the interval. The patient voices understanding of current disease status and treatment options and is in agreement with the current care plan. All questions were answered. The patient knows to call the clinic with any problems, questions or concerns. We can certainly see the patient much sooner if necessary.  Disclaimer: This note was dictated with voice recognition software. Similar sounding words can inadvertently be transcribed and may not be corrected upon review.

## 2017-11-30 NOTE — Patient Instructions (Signed)
Steps to Quit Smoking Smoking tobacco can be bad for your health. It can also affect almost every organ in your body. Smoking puts you and people around you at risk for many serious long-lasting (chronic) diseases. Quitting smoking is hard, but it is one of the best things that you can do for your health. It is never too late to quit. What are the benefits of quitting smoking? When you quit smoking, you lower your risk for getting serious diseases and conditions. They can include:  Lung cancer or lung disease.  Heart disease.  Stroke.  Heart attack.  Not being able to have children (infertility).  Weak bones (osteoporosis) and broken bones (fractures).  If you have coughing, wheezing, and shortness of breath, those symptoms may get better when you quit. You may also get sick less often. If you are pregnant, quitting smoking can help to lower your chances of having a baby of low birth weight. What can I do to help me quit smoking? Talk with your doctor about what can help you quit smoking. Some things you can do (strategies) include:  Quitting smoking totally, instead of slowly cutting back how much you smoke over a period of time.  Going to in-person counseling. You are more likely to quit if you go to many counseling sessions.  Using resources and support systems, such as: ? Online chats with a counselor. ? Phone quitlines. ? Printed self-help materials. ? Support groups or group counseling. ? Text messaging programs. ? Mobile phone apps or applications.  Taking medicines. Some of these medicines may have nicotine in them. If you are pregnant or breastfeeding, do not take any medicines to quit smoking unless your doctor says it is okay. Talk with your doctor about counseling or other things that can help you.  Talk with your doctor about using more than one strategy at the same time, such as taking medicines while you are also going to in-person counseling. This can help make  quitting easier. What things can I do to make it easier to quit? Quitting smoking might feel very hard at first, but there is a lot that you can do to make it easier. Take these steps:  Talk to your family and friends. Ask them to support and encourage you.  Call phone quitlines, reach out to support groups, or work with a counselor.  Ask people who smoke to not smoke around you.  Avoid places that make you want (trigger) to smoke, such as: ? Bars. ? Parties. ? Smoke-break areas at work.  Spend time with people who do not smoke.  Lower the stress in your life. Stress can make you want to smoke. Try these things to help your stress: ? Getting regular exercise. ? Deep-breathing exercises. ? Yoga. ? Meditating. ? Doing a body scan. To do this, close your eyes, focus on one area of your body at a time from head to toe, and notice which parts of your body are tense. Try to relax the muscles in those areas.  Download or buy apps on your mobile phone or tablet that can help you stick to your quit plan. There are many free apps, such as QuitGuide from the CDC (Centers for Disease Control and Prevention). You can find more support from smokefree.gov and other websites.  This information is not intended to replace advice given to you by your health care provider. Make sure you discuss any questions you have with your health care provider. Document Released: 03/05/2009 Document   Revised: 01/05/2016 Document Reviewed: 09/23/2014 Elsevier Interactive Patient Education  2018 Elsevier Inc.  

## 2017-11-30 NOTE — Telephone Encounter (Signed)
Printed avs and calender of upcoming appointments. Per 7/11 los (added 1 additional appointment acording to care plan

## 2017-11-30 NOTE — Patient Instructions (Signed)
[facility] Discharge Instructions for Patients Receiving Chemotherapy  Today you received the following chemotherapy agents :  Keytruda.  To help prevent nausea and vomiting after your treatment, we encourage you to take your nausea medication as prescribed.   If you develop nausea and vomiting that is not controlled by your nausea medication, call the clinic.   BELOW ARE SYMPTOMS THAT SHOULD BE REPORTED IMMEDIATELY:  *FEVER GREATER THAN 100.5 F  *CHILLS WITH OR WITHOUT FEVER  NAUSEA AND VOMITING THAT IS NOT CONTROLLED WITH YOUR NAUSEA MEDICATION  *UNUSUAL SHORTNESS OF BREATH  *UNUSUAL BRUISING OR BLEEDING  TENDERNESS IN MOUTH AND THROAT WITH OR WITHOUT PRESENCE OF ULCERS  *URINARY PROBLEMS  *BOWEL PROBLEMS  UNUSUAL RASH Items with * indicate a potential emergency and should be followed up as soon as possible.  Feel free to call the clinic should you have any questions or concerns. The clinic phone number is [phone].  Please show the CHEMO ALERT CARD at check-in to the Emergency Department and triage nurse.  

## 2017-12-19 ENCOUNTER — Telehealth: Payer: Self-pay | Admitting: *Deleted

## 2017-12-19 NOTE — Telephone Encounter (Signed)
Pt called lmovm states " Dr, Julien Nordmann told me to call if I had trouble sleeping because of the fluid." Returned call to pt: Pt states It's not bad enough to have to go tot he ED, when lay flat he has wheezing and coughing, congestion. Recommend pt use pillows at night to sleep or sleep in a reclined position to assist with breathing. Pt will be seen on Thursday. Will review with MD possible thoracentesis

## 2017-12-21 ENCOUNTER — Inpatient Hospital Stay (HOSPITAL_BASED_OUTPATIENT_CLINIC_OR_DEPARTMENT_OTHER): Payer: Medicare Other | Admitting: Internal Medicine

## 2017-12-21 ENCOUNTER — Inpatient Hospital Stay: Payer: Medicare Other

## 2017-12-21 ENCOUNTER — Telehealth: Payer: Self-pay | Admitting: Internal Medicine

## 2017-12-21 ENCOUNTER — Inpatient Hospital Stay: Payer: Medicare Other | Attending: Internal Medicine

## 2017-12-21 ENCOUNTER — Encounter: Payer: Self-pay | Admitting: Internal Medicine

## 2017-12-21 VITALS — BP 131/81 | HR 94 | Temp 98.4°F | Resp 17 | Ht 71.0 in | Wt 154.1 lb

## 2017-12-21 DIAGNOSIS — Z79899 Other long term (current) drug therapy: Secondary | ICD-10-CM | POA: Insufficient documentation

## 2017-12-21 DIAGNOSIS — Z5112 Encounter for antineoplastic immunotherapy: Secondary | ICD-10-CM | POA: Diagnosis present

## 2017-12-21 DIAGNOSIS — Z5189 Encounter for other specified aftercare: Secondary | ICD-10-CM | POA: Insufficient documentation

## 2017-12-21 DIAGNOSIS — C3491 Malignant neoplasm of unspecified part of right bronchus or lung: Secondary | ICD-10-CM

## 2017-12-21 DIAGNOSIS — C3411 Malignant neoplasm of upper lobe, right bronchus or lung: Secondary | ICD-10-CM | POA: Insufficient documentation

## 2017-12-21 DIAGNOSIS — J449 Chronic obstructive pulmonary disease, unspecified: Secondary | ICD-10-CM

## 2017-12-21 DIAGNOSIS — J910 Malignant pleural effusion: Secondary | ICD-10-CM

## 2017-12-21 LAB — COMPREHENSIVE METABOLIC PANEL
ALT: 14 U/L (ref 0–44)
AST: 21 U/L (ref 15–41)
Albumin: 3.3 g/dL — ABNORMAL LOW (ref 3.5–5.0)
Alkaline Phosphatase: 69 U/L (ref 38–126)
Anion gap: 10 (ref 5–15)
BUN: 15 mg/dL (ref 8–23)
CO2: 27 mmol/L (ref 22–32)
Calcium: 9.3 mg/dL (ref 8.9–10.3)
Chloride: 102 mmol/L (ref 98–111)
Creatinine, Ser: 0.86 mg/dL (ref 0.61–1.24)
Glucose, Bld: 87 mg/dL (ref 70–99)
POTASSIUM: 4 mmol/L (ref 3.5–5.1)
Sodium: 139 mmol/L (ref 135–145)
Total Bilirubin: 0.4 mg/dL (ref 0.3–1.2)
Total Protein: 7.2 g/dL (ref 6.5–8.1)

## 2017-12-21 LAB — CBC WITH DIFFERENTIAL/PLATELET
Basophils Absolute: 0 10*3/uL (ref 0.0–0.1)
Basophils Relative: 0 %
EOS ABS: 0.1 10*3/uL (ref 0.0–0.5)
EOS PCT: 1 %
HCT: 47.2 % (ref 38.4–49.9)
Hemoglobin: 15.2 g/dL (ref 13.0–17.1)
LYMPHS ABS: 1.6 10*3/uL (ref 0.9–3.3)
Lymphocytes Relative: 37 %
MCH: 28.1 pg (ref 27.2–33.4)
MCHC: 32.2 g/dL (ref 32.0–36.0)
MCV: 87.4 fL (ref 79.3–98.0)
Monocytes Absolute: 0.5 10*3/uL (ref 0.1–0.9)
Monocytes Relative: 11 %
Neutro Abs: 2.2 10*3/uL (ref 1.5–6.5)
Neutrophils Relative %: 51 %
PLATELETS: 176 10*3/uL (ref 140–400)
RBC: 5.4 MIL/uL (ref 4.20–5.82)
RDW: 16.1 % — ABNORMAL HIGH (ref 11.0–14.6)
WBC: 4.4 10*3/uL (ref 4.0–10.3)

## 2017-12-21 LAB — TSH: TSH: 1.667 u[IU]/mL (ref 0.320–4.118)

## 2017-12-21 MED ORDER — PEMBROLIZUMAB CHEMO INJECTION 100 MG/4ML
200.0000 mg | Freq: Once | INTRAVENOUS | Status: AC
  Administered 2017-12-21: 200 mg via INTRAVENOUS
  Filled 2017-12-21: qty 8

## 2017-12-21 MED ORDER — SODIUM CHLORIDE 0.9 % IV SOLN
Freq: Once | INTRAVENOUS | Status: AC
Start: 2017-12-21 — End: 2017-12-21
  Administered 2017-12-21: 10:00:00 via INTRAVENOUS
  Filled 2017-12-21: qty 250

## 2017-12-21 NOTE — Patient Instructions (Signed)
[facility] Discharge Instructions for Patients Receiving Chemotherapy  Today you received the following chemotherapy agents :  Keytruda.  To help prevent nausea and vomiting after your treatment, we encourage you to take your nausea medication as prescribed.   If you develop nausea and vomiting that is not controlled by your nausea medication, call the clinic.   BELOW ARE SYMPTOMS THAT SHOULD BE REPORTED IMMEDIATELY:  *FEVER GREATER THAN 100.5 F  *CHILLS WITH OR WITHOUT FEVER  NAUSEA AND VOMITING THAT IS NOT CONTROLLED WITH YOUR NAUSEA MEDICATION  *UNUSUAL SHORTNESS OF BREATH  *UNUSUAL BRUISING OR BLEEDING  TENDERNESS IN MOUTH AND THROAT WITH OR WITHOUT PRESENCE OF ULCERS  *URINARY PROBLEMS  *BOWEL PROBLEMS  UNUSUAL RASH Items with * indicate a potential emergency and should be followed up as soon as possible.  Feel free to call the clinic should you have any questions or concerns. The clinic phone number is [phone].  Please show the CHEMO ALERT CARD at check-in to the Emergency Department and triage nurse.  

## 2017-12-21 NOTE — Progress Notes (Signed)
[facility] Telephone:([phone]   Fax:(336) [place], [place] [address]  DIAGNOSIS: Stage IV (T3, N1, M1a) non-small cell lung cancer, adenocarcinoma diagnosed in July 2018 and presented with a right hilar lymphadenopathy and malignant right pleural effusion diagnosed in July 2018. Molecular studies were negative for EGFR, ALK, ROS 1 and BRAF mutations. PDL 1 expression was 50%  PRIOR THERAPY: None  CURRENT THERAPY: Ketruda (pembrolizumab) 200 mg IV every 3 weeks, status post 13 cycles.  INTERVAL HISTORY: Bradley Maldonado 67 y.o. male returns to the clinic today for follow-up visit accompanied by his brother.  The patient is feeling fine today with no specific complaints except for shortness of breath with exertion and mild cough.  He denied having any chest pain or hemoptysis.  He denied having any recent weight loss or night sweats.  He has no nausea, vomiting, diarrhea or constipation.  He continues to tolerate his treatment with Keytruda fairly well.  [name] here for evaluation before starting cycle #14.  MEDICAL HISTORY: Past Medical History:  Diagnosis Date  . Adenocarcinoma of right lung, stage 4 ([place]) 01/24/2017  . Goals of care, counseling/discussion 01/24/2017  . Hypertension     ALLERGIES:  has No Known Allergies.  MEDICATIONS:  Current Outpatient Medications  Medication Sig Dispense Refill  . amLODipine (NORVASC) 5 MG tablet Take 1 tablet (5 mg total) by mouth daily. 30 tablet 3  . guaiFENesin (ROBITUSSIN) 100 MG/5ML SOLN Take 10 mLs by mouth at bedtime as needed for cough or to loosen phlegm.    . Omega-3 Fatty Acids (FISH OIL) 1200 MG CAPS Take 1,200 mg by mouth every other day.     No current facility-administered medications for this visit.     SURGICAL HISTORY:  Past Surgical History:  Procedure Laterality Date  . CHEST TUBE INSERTION Right 02/06/2017   Procedure:  INSERTION PLEURAL DRAINAGE CATHETER;  Surgeon: Ivin Poot, MD;  Location: [facility];  Service: Thoracic;  Laterality: Right;  . EYE SURGERY Right    metal removed from eye  . Hip replacement    . IR THORACENTESIS ASP PLEURAL SPACE W/IMG GUIDE  12/19/2016  . REMOVAL OF PLEURAL DRAINAGE CATHETER Right 03/21/2017   Procedure: REMOVAL OF RIGHT PLEURAL DRAINAGE CATHETER;  Surgeon: Ivin Poot, MD;  Location: [facility];  Service: Thoracic;  Laterality: Right;    REVIEW OF SYSTEMS:  A comprehensive review of systems was negative except for: Constitutional: positive for fatigue Respiratory: positive for cough and dyspnea on exertion   PHYSICAL EXAMINATION: General appearance: alert, cooperative, fatigued and no distress Head: Normocephalic, without obvious abnormality, atraumatic Neck: no adenopathy, no JVD, supple, symmetrical, trachea midline and thyroid not enlarged, symmetric, no tenderness/mass/nodules Lymph nodes: Cervical, supraclavicular, and axillary nodes normal. Resp: clear to auscultation bilaterally Back: symmetric, no curvature. ROM normal. No CVA tenderness. Cardio: regular rate and rhythm, S1, S2 normal, no murmur, click, rub or gallop GI: soft, non-tender; bowel sounds normal; no masses,  no organomegaly Extremities: extremities normal, atraumatic, no cyanosis or edema  ECOG PERFORMANCE STATUS: 1 - Symptomatic but completely ambulatory  Blood pressure 131/81, pulse 94, temperature 98.4 F (36.9 C), temperature source Oral, resp. rate 17, height '5\' 11"'$  (1.803 m), weight 154 lb 1.6 oz (69.9 kg), SpO2 93 %.  LABORATORY DATA: Lab Results  Component Value Date   WBC 4.4 12/21/2017   HGB 15.2 12/21/2017   HCT  47.2 12/21/2017   MCV 87.4 12/21/2017   PLT 176 12/21/2017      Chemistry      Component Value Date/Time   NA 141 11/30/2017 0902   NA 138 05/04/2017 1011   K 4.1 11/30/2017 0902   K 3.9 05/04/2017 1011   CL 103 11/30/2017 0902   CO2 28 11/30/2017 0902   CO2 24  05/04/2017 1011   BUN 9 11/30/2017 0902   BUN 18.4 05/04/2017 1011   CREATININE 0.81 11/30/2017 0902   CREATININE 1.0 05/04/2017 1011      Component Value Date/Time   CALCIUM 9.3 11/30/2017 0902   CALCIUM 9.3 05/04/2017 1011   ALKPHOS 77 11/30/2017 0902   ALKPHOS 91 05/04/2017 1011   AST 22 11/30/2017 0902   AST 23 05/04/2017 1011   ALT 15 11/30/2017 0902   ALT 19 05/04/2017 1011   BILITOT 0.4 11/30/2017 0902   BILITOT 0.39 05/04/2017 1011       RADIOGRAPHIC STUDIES: Ct Chest W Contrast  Result Date: 11/28/2017 CLINICAL DATA:  Metastatic adenocarcinoma of the lung. Immunotherapy in progress. EXAM: CT CHEST, ABDOMEN, AND PELVIS WITH CONTRAST TECHNIQUE: Multidetector CT imaging of the chest, abdomen and pelvis was performed following the standard protocol during bolus administration of intravenous contrast. CONTRAST:  161m ISOVUE-300 IOPAMIDOL (ISOVUE-300) INJECTION 61% COMPARISON:  CT scan 09/26/2017 and 07/26/2017 FINDINGS: CT CHEST FINDINGS Cardiovascular: The heart is normal in size. No pericardial effusion. The aorta is normal in caliber. Stable atherosclerotic calcifications. No dissection. The branch vessels are patent. Stable three-vessel coronary artery calcifications. Mediastinum/Nodes: Stable appearing right suprahilar tumor surrounding a branch of the right upper lobe pulmonary artery. This measures 27 mm on image number 30 and measured 24.5 mm on the prior exam. 8.5 mm aorticopulmonary window lymph node on image number 30 is unchanged. Extensive low-attenuation necrotic appearing subcarinal adenopathy measuring 17 mm on image number 37 and previously measuring 15 mm. Lungs/Pleura: Extensive pleural tumor involving the right hemithorax appears relatively stable. There is also extensive tumor in the lung with large area of consolidation on image number 35 measuring 6.6 cm which appears stable. Extensive interstitial spread of tumor throughout the right lung. Persistent loculated  pleural fluid collection at the right lung base. Stable 8 mm epicardial lymph node on image number 55. Numerous metastatic pulmonary nodules are noted in the left lung. Slight interval enlargement of the pulmonary nodules. Peripheral left lower lobe nodule on image number 102 measures 6.5 mm and previously measured 5 mm. 6 mm nodule in the left upper lobe adjacent to the major fissure on image 77 previously measured 5 mm. Several other pulmonary nodules are slightly larger. Musculoskeletal: No definite osseous metastatic disease. Stable enhancing metastatic right axillary lymph nodes. 11 mm node on image number 20 is stable and 9.5 mm node on image number 27 is stable. Lower right axillary lymph node on image number 36 measures 9.5 mm and previously measured 8.5 mm. CT ABDOMEN PELVIS FINDINGS Hepatobiliary: No hepatic lesions to suggest metastatic disease. The gallbladder is normal. No intra or extrahepatic biliary dilatation. Pancreas: No mass, inflammation or ductal dilatation. Spleen: Normal size.  No focal lesions. Adrenals/Urinary Tract: The adrenal glands are unremarkable and stable. No worrisome renal lesions. Stomach/Bowel: The stomach, duodenum, small bowel and colon are grossly normal. Vascular/Lymphatic: Right-sided retroperitoneal adenopathy is again demonstrated. 16 mm node deep to the IVC on image number 81 previously measured 12.5 mm. Large nodal mass at the level of the right iliac crest on image number  89 measures 5.0 x 4.0 cm and previously measured 4.5 x 3.7 cm. Bulky right-sided pelvic adenopathy appears relatively stable. Nodal lesion on image number 104 measures 4.0 x 2.1 cm and is unchanged. Nodal lesion on image number 111 measures 4.3 x 2.1 cm and is stable. Small scattered inguinal nodes are stable. Reproductive: The prostate gland and seminal vesicles are grossly normal. Other: No inguinal hernia or subcutaneous lesions. Musculoskeletal: Artifact associated with the right hip hardware.  Severe left hip joint degenerative changes. No obvious metastatic bone disease. IMPRESSION: 1. Overall the right lung appears relatively stable. There is extensive pleural and parenchymal tumor throughout the right hemithorax with diffuse interstitial spread of tumor. Stable loculated pleural fluid collection at the right lung base. 2. Overall stable right hilar and mediastinal lymphadenopathy. 3. Slight progression of pulmonary metastatic disease involving the left lung. 4. Right-sided retroperitoneal adenopathy is slightly progressive but the right-sided pelvic adenopathy appears stable. 5. Stable right axillary lymphadenopathy. Electronically Signed   By: Marijo Sanes M.D.   On: 11/28/2017 10:52   Ct Abdomen Pelvis W Contrast  Result Date: 11/28/2017 CLINICAL DATA:  Metastatic adenocarcinoma of the lung. Immunotherapy in progress. EXAM: CT CHEST, ABDOMEN, AND PELVIS WITH CONTRAST TECHNIQUE: Multidetector CT imaging of the chest, abdomen and pelvis was performed following the standard protocol during bolus administration of intravenous contrast. CONTRAST:  142m ISOVUE-300 IOPAMIDOL (ISOVUE-300) INJECTION 61% COMPARISON:  CT scan 09/26/2017 and 07/26/2017 FINDINGS: CT CHEST FINDINGS Cardiovascular: The heart is normal in size. No pericardial effusion. The aorta is normal in caliber. Stable atherosclerotic calcifications. No dissection. The branch vessels are patent. Stable three-vessel coronary artery calcifications. Mediastinum/Nodes: Stable appearing right suprahilar tumor surrounding a branch of the right upper lobe pulmonary artery. This measures 27 mm on image number 30 and measured 24.5 mm on the prior exam. 8.5 mm aorticopulmonary window lymph node on image number 30 is unchanged. Extensive low-attenuation necrotic appearing subcarinal adenopathy measuring 17 mm on image number 37 and previously measuring 15 mm. Lungs/Pleura: Extensive pleural tumor involving the right hemithorax appears relatively  stable. There is also extensive tumor in the lung with large area of consolidation on image number 35 measuring 6.6 cm which appears stable. Extensive interstitial spread of tumor throughout the right lung. Persistent loculated pleural fluid collection at the right lung base. Stable 8 mm epicardial lymph node on image number 55. Numerous metastatic pulmonary nodules are noted in the left lung. Slight interval enlargement of the pulmonary nodules. Peripheral left lower lobe nodule on image number 102 measures 6.5 mm and previously measured 5 mm. 6 mm nodule in the left upper lobe adjacent to the major fissure on image 77 previously measured 5 mm. Several other pulmonary nodules are slightly larger. Musculoskeletal: No definite osseous metastatic disease. Stable enhancing metastatic right axillary lymph nodes. 11 mm node on image number 20 is stable and 9.5 mm node on image number 27 is stable. Lower right axillary lymph node on image number 36 measures 9.5 mm and previously measured 8.5 mm. CT ABDOMEN PELVIS FINDINGS Hepatobiliary: No hepatic lesions to suggest metastatic disease. The gallbladder is normal. No intra or extrahepatic biliary dilatation. Pancreas: No mass, inflammation or ductal dilatation. Spleen: Normal size.  No focal lesions. Adrenals/Urinary Tract: The adrenal glands are unremarkable and stable. No worrisome renal lesions. Stomach/Bowel: The stomach, duodenum, small bowel and colon are grossly normal. Vascular/Lymphatic: Right-sided retroperitoneal adenopathy is again demonstrated. 16 mm node deep to the IVC on image number 81 previously measured 12.5 mm. Large  nodal mass at the level of the right iliac crest on image number 89 measures 5.0 x 4.0 cm and previously measured 4.5 x 3.7 cm. Bulky right-sided pelvic adenopathy appears relatively stable. Nodal lesion on image number 104 measures 4.0 x 2.1 cm and is unchanged. Nodal lesion on image number 111 measures 4.3 x 2.1 cm and is stable. Small  scattered inguinal nodes are stable. Reproductive: The prostate gland and seminal vesicles are grossly normal. Other: No inguinal hernia or subcutaneous lesions. Musculoskeletal: Artifact associated with the right hip hardware. Severe left hip joint degenerative changes. No obvious metastatic bone disease. IMPRESSION: 1. Overall the right lung appears relatively stable. There is extensive pleural and parenchymal tumor throughout the right hemithorax with diffuse interstitial spread of tumor. Stable loculated pleural fluid collection at the right lung base. 2. Overall stable right hilar and mediastinal lymphadenopathy. 3. Slight progression of pulmonary metastatic disease involving the left lung. 4. Right-sided retroperitoneal adenopathy is slightly progressive but the right-sided pelvic adenopathy appears stable. 5. Stable right axillary lymphadenopathy. Electronically Signed   By: Marijo Sanes M.D.   On: 11/28/2017 10:52    ASSESSMENT AND PLAN: This is a very pleasant 67 years old African-American male with a stage IV non-small cell lung cancer, adenocarcinoma with PDL 1 expression of 50% and negative for actionable mutations. The patient is currently undergoing treatment with immunotherapy with Keytruda 200 mg IV every 3 weeks status post 13 cycles.  The patient continues to tolerate this treatment well with no concerning complaints. I recommended for him to proceed with cycle #14 today as scheduled. He will come back for follow-up visit in 3 weeks for evaluation before starting cycle #15. He was advised to call immediately if he has any worsening shortness of breath or any other complaints. The patient voices understanding of current disease status and treatment options and is in agreement with the current care plan. All questions were answered. The patient knows to call the clinic with any problems, questions or concerns. We can certainly see the patient much sooner if necessary.  Disclaimer: This  note was dictated with voice recognition software. Similar sounding words can inadvertently be transcribed and may not be corrected upon review.

## 2017-12-21 NOTE — Telephone Encounter (Signed)
Scheduled appt per 8/1 los - gave patient aVS and calender per los.  

## 2018-01-08 ENCOUNTER — Encounter: Payer: Self-pay | Admitting: Family Medicine

## 2018-01-08 ENCOUNTER — Ambulatory Visit (INDEPENDENT_AMBULATORY_CARE_PROVIDER_SITE_OTHER): Payer: Medicare Other | Admitting: Family Medicine

## 2018-01-08 VITALS — BP 110/70 | HR 102 | Ht 69.5 in | Wt 151.0 lb

## 2018-01-08 DIAGNOSIS — C3491 Malignant neoplasm of unspecified part of right bronchus or lung: Secondary | ICD-10-CM | POA: Diagnosis not present

## 2018-01-08 DIAGNOSIS — I10 Essential (primary) hypertension: Secondary | ICD-10-CM

## 2018-01-08 DIAGNOSIS — Z7689 Persons encountering health services in other specified circumstances: Secondary | ICD-10-CM

## 2018-01-08 DIAGNOSIS — H547 Unspecified visual loss: Secondary | ICD-10-CM

## 2018-01-08 DIAGNOSIS — H0520 Unspecified exophthalmos: Secondary | ICD-10-CM

## 2018-01-08 DIAGNOSIS — F1910 Other psychoactive substance abuse, uncomplicated: Secondary | ICD-10-CM

## 2018-01-08 DIAGNOSIS — H938X2 Other specified disorders of left ear: Secondary | ICD-10-CM

## 2018-01-08 DIAGNOSIS — Z8349 Family history of other endocrine, nutritional and metabolic diseases: Secondary | ICD-10-CM

## 2018-01-08 NOTE — Progress Notes (Signed)
Subjective:    Patient ID: Bradley Maldonado, male    DOB: [birth year], 67 y.o.   MRN: [id number]  HPI Chief Complaint  Patient presents with  . new pt get established    new pt, get established. fluid in the eye. eye is poking out more,    [name] new to the practice and here to establish care. He also has a concern regarding an eye complaint. States his left eye has been "sticking out" more than his right eye for the past 2 years or so. States his vision seems to be getting worse. Denies history of thyroid disease but does report a family history.   Previous medical care: no regular medical care until he was diagnosed with lung cancer in 11/2016  Also complains of left ear feeling "stopped up and aching" for the past year.  States he has some intermittent rhinorrhea.  No sore throat or dysphagia.   Admits to snorting cocaine 1-2 times per week for the past year. He has not told his oncologist this.   Denies fever, chills, night sweats, unexplained weight loss, dysphagia, chest pain, palpitations, N/V/D, diarrhea, constipation,  No bleeding or bruising issues.   DOE. No dyspnea at rest.   Other providers: Oncologist- Dr. Julien Nordmann   Past medical history: Stage 4 non-small cell lung cancer, adenocarcinoma diagnosed in July 2018- currently on Keytruda.   HTN- denies history of HTN, states when he was hospitalized in July 2018 he was started on amlodipine.  States he has not been on medication for at least 3 months and his BP today is in goal range.  [name] asking if he needs to be on this medication.  Does not check BP at home.   Social history: Lives alone. He has one daughter, Bradley Maldonado and she lives in [place]. Stopped working in 2006.  He has a male friend who helps him some days. Reports doing Cocaine some days.  [name] a smoker but has weaned down. Only 1 cigarette every other day now.  Smoked since age 79.   Drinks 3-4 beers every other day.  Goes to the gym 1-2 times  per week.   Reviewed allergies, medications, past medical, surgical, family, and social history.    Review of Systems Pertinent positives and negatives in the history of present illness.     Objective:   Physical Exam BP 110/70   Pulse (!) 102   Ht 5' 9.5" (1.765 m)   Wt 151 lb (68.5 kg)   SpO2 93%   BMI 21.98 kg/m   Alert and in no distress. Left eye appears to be somewhat protruding compared to right eye. PERRLA, EOMs intact. Tympanic membranes and canals are normal. Pharyngeal area is normal. Neck is supple without adenopathy or thyromegaly. Cardiac exam shows a regular sinus rhythm without murmurs or gallops. Lung exam shows decreased course lung sounds in RUL but clear otherwise. Extremities without edema, intact pulses. Skin is warm and dry.       Assessment & Plan:  Sensation of fullness in left ear - Plan: Tympanometry  Encounter to establish care - Plan: Visual acuity screening, Tympanometry  Adenocarcinoma of right lung, stage 4 ([facility])  Exophthalmos of left eye - Plan: Visual acuity screening, FSH/LH, Prolactin, TSH, T4, free, T3  Hypertension, unspecified type  Decreased visual acuity  Family history of thyroid disease - Plan: TSH, T4, free, T3  Substance abuse ([place])  Reviewed MRI from 2018 and orbits were negative.  Referral  to ophthalmologist for decreased visual acuity - left 20/50, right 20/25 Questionable exopthalmos of left eye ongoing for at least 2 years.  Discussed that ear exam is normal. Hearing is decreased and this is chronic.  HTN- no apparent history of this but has been on medication at times since July 2018. He has been off medication for 3 months and his BP is normal. He may stop this and keep an eye on his BP outside of here.  Counseling done on healthy diet and exercise.  Counseling also done on stopping alcohol and cocaine and potential side effects. Advised he should alert his oncologist that he is using this. Offered help to stop but he  reports he can do this on his own.  Will check labs for exophthalmos, unilateral.  Follow up pending labs and for fasting AWV.  Spent 45 minutes face to face with patient and at least 50% was in counseling and coordination of care.

## 2018-01-08 NOTE — Patient Instructions (Addendum)
Your blood pressure today is 110/70. Goal blood pressure is less than 130/80.   If you are seeing readings higher than this then on a regular basis then you might need to start back on your medication for this.   I am referring you to the eye doctor for your decreased vision of your left eye.   Stop using Cocaine and let your cancer doctor know about this.   Return in 4 weeks.     [facility]- Appointment Tuesday September 17,2019 @ 8:30am Address: [address], [place], [place] [address] Phone: [phone]

## 2018-01-09 LAB — T3: T3, Total: 107 ng/dL (ref 71–180)

## 2018-01-09 LAB — T4, FREE: Free T4: 0.99 ng/dL (ref 0.82–1.77)

## 2018-01-09 LAB — FSH/LH
FSH: 9.6 m[IU]/mL (ref 1.5–12.4)
LH: 8.2 m[IU]/mL (ref 1.7–8.6)

## 2018-01-09 LAB — PROLACTIN: Prolactin: 9.2 ng/mL (ref 4.0–15.2)

## 2018-01-09 LAB — TSH: TSH: 1.7 u[IU]/mL (ref 0.450–4.500)

## 2018-01-11 ENCOUNTER — Inpatient Hospital Stay (HOSPITAL_BASED_OUTPATIENT_CLINIC_OR_DEPARTMENT_OTHER): Payer: Medicare Other | Admitting: Internal Medicine

## 2018-01-11 ENCOUNTER — Inpatient Hospital Stay: Payer: Medicare Other

## 2018-01-11 ENCOUNTER — Other Ambulatory Visit: Payer: Self-pay | Admitting: Internal Medicine

## 2018-01-11 ENCOUNTER — Telehealth: Payer: Self-pay

## 2018-01-11 ENCOUNTER — Encounter: Payer: Self-pay | Admitting: Internal Medicine

## 2018-01-11 VITALS — BP 136/93 | HR 95 | Temp 98.3°F | Resp 19 | Ht 69.5 in | Wt 154.4 lb

## 2018-01-11 DIAGNOSIS — C3411 Malignant neoplasm of upper lobe, right bronchus or lung: Secondary | ICD-10-CM | POA: Diagnosis not present

## 2018-01-11 DIAGNOSIS — C3491 Malignant neoplasm of unspecified part of right bronchus or lung: Secondary | ICD-10-CM

## 2018-01-11 DIAGNOSIS — Z5112 Encounter for antineoplastic immunotherapy: Secondary | ICD-10-CM

## 2018-01-11 DIAGNOSIS — I10 Essential (primary) hypertension: Secondary | ICD-10-CM

## 2018-01-11 DIAGNOSIS — C3490 Malignant neoplasm of unspecified part of unspecified bronchus or lung: Secondary | ICD-10-CM

## 2018-01-11 LAB — CBC WITH DIFFERENTIAL/PLATELET
BASOS PCT: 0 %
Basophils Absolute: 0 10*3/uL (ref 0.0–0.1)
Eosinophils Absolute: 0.1 10*3/uL (ref 0.0–0.5)
Eosinophils Relative: 2 %
HCT: 47.5 % (ref 38.4–49.9)
HEMOGLOBIN: 14.9 g/dL (ref 13.0–17.1)
Lymphocytes Relative: 33 %
Lymphs Abs: 1.6 10*3/uL (ref 0.9–3.3)
MCH: 27.7 pg (ref 27.2–33.4)
MCHC: 31.4 g/dL — ABNORMAL LOW (ref 32.0–36.0)
MCV: 88.3 fL (ref 79.3–98.0)
Monocytes Absolute: 0.5 10*3/uL (ref 0.1–0.9)
Monocytes Relative: 10 %
NEUTROS PCT: 55 %
Neutro Abs: 2.6 10*3/uL (ref 1.5–6.5)
Platelets: 209 10*3/uL (ref 140–400)
RBC: 5.38 MIL/uL (ref 4.20–5.82)
RDW: 16.3 % — ABNORMAL HIGH (ref 11.0–14.6)
WBC: 4.8 10*3/uL (ref 4.0–10.3)

## 2018-01-11 LAB — COMPREHENSIVE METABOLIC PANEL
ALK PHOS: 87 U/L (ref 38–126)
ALT: 17 U/L (ref 0–44)
AST: 22 U/L (ref 15–41)
Albumin: 2.9 g/dL — ABNORMAL LOW (ref 3.5–5.0)
Anion gap: 9 (ref 5–15)
BUN: 8 mg/dL (ref 8–23)
CALCIUM: 9.3 mg/dL (ref 8.9–10.3)
CO2: 29 mmol/L (ref 22–32)
Chloride: 104 mmol/L (ref 98–111)
Creatinine, Ser: 0.79 mg/dL (ref 0.61–1.24)
Glucose, Bld: 128 mg/dL — ABNORMAL HIGH (ref 70–99)
Potassium: 3.9 mmol/L (ref 3.5–5.1)
Sodium: 142 mmol/L (ref 135–145)
TOTAL PROTEIN: 7.1 g/dL (ref 6.5–8.1)
Total Bilirubin: <0.2 mg/dL — ABNORMAL LOW (ref 0.3–1.2)

## 2018-01-11 LAB — TSH: TSH: 1.484 u[IU]/mL (ref 0.320–4.118)

## 2018-01-11 MED ORDER — SODIUM CHLORIDE 0.9 % IV SOLN
200.0000 mg | Freq: Once | INTRAVENOUS | Status: AC
  Administered 2018-01-11: 200 mg via INTRAVENOUS
  Filled 2018-01-11: qty 8

## 2018-01-11 MED ORDER — SODIUM CHLORIDE 0.9 % IV SOLN
Freq: Once | INTRAVENOUS | Status: AC
  Administered 2018-01-11: 12:00:00 via INTRAVENOUS
  Filled 2018-01-11: qty 250

## 2018-01-11 NOTE — Telephone Encounter (Signed)
Printed avs and calender of upcoming appointment. Per 8/22 los 

## 2018-01-11 NOTE — Patient Instructions (Signed)
Steps to Quit Smoking Smoking tobacco can be bad for your health. It can also affect almost every organ in your body. Smoking puts you and people around you at risk for many serious long-lasting (chronic) diseases. Quitting smoking is hard, but it is one of the best things that you can do for your health. It is never too late to quit. What are the benefits of quitting smoking? When you quit smoking, you lower your risk for getting serious diseases and conditions. They can include:  Lung cancer or lung disease.  Heart disease.  Stroke.  Heart attack.  Not being able to have children (infertility).  Weak bones (osteoporosis) and broken bones (fractures).  If you have coughing, wheezing, and shortness of breath, those symptoms may get better when you quit. You may also get sick less often. If you are pregnant, quitting smoking can help to lower your chances of having a baby of low birth weight. What can I do to help me quit smoking? Talk with your doctor about what can help you quit smoking. Some things you can do (strategies) include:  Quitting smoking totally, instead of slowly cutting back how much you smoke over a period of time.  Going to in-person counseling. You are more likely to quit if you go to many counseling sessions.  Using resources and support systems, such as: ? Online chats with a counselor. ? Phone quitlines. ? Printed self-help materials. ? Support groups or group counseling. ? Text messaging programs. ? Mobile phone apps or applications.  Taking medicines. Some of these medicines may have nicotine in them. If you are pregnant or breastfeeding, do not take any medicines to quit smoking unless your doctor says it is okay. Talk with your doctor about counseling or other things that can help you.  Talk with your doctor about using more than one strategy at the same time, such as taking medicines while you are also going to in-person counseling. This can help make  quitting easier. What things can I do to make it easier to quit? Quitting smoking might feel very hard at first, but there is a lot that you can do to make it easier. Take these steps:  Talk to your family and friends. Ask them to support and encourage you.  Call phone quitlines, reach out to support groups, or work with a counselor.  Ask people who smoke to not smoke around you.  Avoid places that make you want (trigger) to smoke, such as: ? Bars. ? Parties. ? Smoke-break areas at work.  Spend time with people who do not smoke.  Lower the stress in your life. Stress can make you want to smoke. Try these things to help your stress: ? Getting regular exercise. ? Deep-breathing exercises. ? Yoga. ? Meditating. ? Doing a body scan. To do this, close your eyes, focus on one area of your body at a time from head to toe, and notice which parts of your body are tense. Try to relax the muscles in those areas.  Download or buy apps on your mobile phone or tablet that can help you stick to your quit plan. There are many free apps, such as QuitGuide from the CDC (Centers for Disease Control and Prevention). You can find more support from smokefree.gov and other websites.  This information is not intended to replace advice given to you by your health care provider. Make sure you discuss any questions you have with your health care provider. Document Released: 03/05/2009 Document   Revised: 01/05/2016 Document Reviewed: 09/23/2014 Elsevier Interactive Patient Education  2018 Elsevier Inc.  

## 2018-01-11 NOTE — Progress Notes (Signed)
[facility] Telephone:([phone]   Fax:(336) [phone]  OFFICE PROGRESS NOTE  [name], NP-C [place] [address]  DIAGNOSIS: Stage IV (T3, N1, M1a) non-small cell lung cancer, adenocarcinoma diagnosed in July 2018 and presented with a right hilar lymphadenopathy and malignant right pleural effusion diagnosed in July 2018. Molecular studies were negative for EGFR, ALK, ROS 1 and BRAF mutations. PDL 1 expression was 50%  PRIOR THERAPY: None  CURRENT THERAPY: Ketruda (pembrolizumab) 200 mg IV every 3 weeks, status post 14 cycles.  INTERVAL HISTORY: Bradley Maldonado 67 y.o. male returns to the clinic today for follow-up visit accompanied by a family member.  The patient is feeling fine today with no specific complaints.  He denied having any chest pain, shortness of breath, cough or hemoptysis.  The patient denied having any nausea, vomiting, diarrhea or constipation.  He denied having any recent weight loss or night sweats.  He has no headache or visual changes.  He has been tolerating his treatment with Keytruda fairly well.  [name] here for evaluation before starting cycle #15.   MEDICAL HISTORY: Past Medical History:  Diagnosis Date  . Adenocarcinoma of right lung, stage 4 ([facility]) 01/24/2017  . Goals of care, counseling/discussion 01/24/2017  . Hypertension     ALLERGIES:  has No Known Allergies.  MEDICATIONS:  Current Outpatient Medications  Medication Sig Dispense Refill  . Omega-3 Fatty Acids (FISH OIL) 1200 MG CAPS Take 1,200 mg by mouth every other day.     No current facility-administered medications for this visit.     SURGICAL HISTORY:  Past Surgical History:  Procedure Laterality Date  . CHEST TUBE INSERTION Right 02/06/2017   Procedure: INSERTION PLEURAL DRAINAGE CATHETER;  Surgeon: Ivin Poot, MD;  Location: [facility];  Service: Thoracic;  Laterality: Right;  . EYE SURGERY Right    metal removed from eye  . Hip  replacement    . IR THORACENTESIS ASP PLEURAL SPACE W/IMG GUIDE  12/19/2016  . REMOVAL OF PLEURAL DRAINAGE CATHETER Right 03/21/2017   Procedure: REMOVAL OF RIGHT PLEURAL DRAINAGE CATHETER;  Surgeon: Ivin Poot, MD;  Location: [facility];  Service: Thoracic;  Laterality: Right;    REVIEW OF SYSTEMS:  A comprehensive review of systems was negative except for: Respiratory: positive for cough   PHYSICAL EXAMINATION: General appearance: alert, cooperative and no distress Head: Normocephalic, without obvious abnormality, atraumatic Neck: no adenopathy, no JVD, supple, symmetrical, trachea midline and thyroid not enlarged, symmetric, no tenderness/mass/nodules Lymph nodes: Cervical, supraclavicular, and axillary nodes normal. Resp: clear to auscultation bilaterally Back: symmetric, no curvature. ROM normal. No CVA tenderness. Cardio: regular rate and rhythm, S1, S2 normal, no murmur, click, rub or gallop GI: soft, non-tender; bowel sounds normal; no masses,  no organomegaly Extremities: extremities normal, atraumatic, no cyanosis or edema  ECOG PERFORMANCE STATUS: 1 - Symptomatic but completely ambulatory  Blood pressure (!) 136/93, pulse 95, temperature 98.3 F (36.8 C), temperature source Oral, resp. rate 19, height 5' 9.5" (1.765 m), weight 154 lb 6.4 oz (70 kg), SpO2 90 %.  LABORATORY DATA: Lab Results  Component Value Date   WBC 4.8 01/11/2018   HGB 14.9 01/11/2018   HCT 47.5 01/11/2018   MCV 88.3 01/11/2018   PLT 209 01/11/2018      Chemistry      Component Value Date/Time   NA 139 12/21/2017 0836   NA 138 05/04/2017 1011   K 4.0 12/21/2017 0836  K 3.9 05/04/2017 1011   CL 102 12/21/2017 0836   CO2 27 12/21/2017 0836   CO2 24 05/04/2017 1011   BUN 15 12/21/2017 0836   BUN 18.4 05/04/2017 1011   CREATININE 0.86 12/21/2017 0836   CREATININE 1.0 05/04/2017 1011      Component Value Date/Time   CALCIUM 9.3 12/21/2017 0836   CALCIUM 9.3 05/04/2017 1011   ALKPHOS 69  12/21/2017 0836   ALKPHOS 91 05/04/2017 1011   AST 21 12/21/2017 0836   AST 23 05/04/2017 1011   ALT 14 12/21/2017 0836   ALT 19 05/04/2017 1011   BILITOT 0.4 12/21/2017 0836   BILITOT 0.39 05/04/2017 1011       RADIOGRAPHIC STUDIES: No results found.  ASSESSMENT AND PLAN: This is a very pleasant 67 years old African-American male with a stage IV non-small cell lung cancer, adenocarcinoma with PDL 1 expression of 50% and negative for actionable mutations. The patient is currently undergoing treatment with immunotherapy with Keytruda 200 mg IV every 3 weeks status post 14 cycles.  He tolerated the last cycle of his treatment well with no concerning complaints. I recommended for him to proceed with cycle #15 today as scheduled. I will see the patient back for follow-up visit in 3 weeks for evaluation after repeating CT scan of the chest, abdomen and pelvis for restaging of his disease. The patient was advised to call immediately if he has any concerning symptoms in the interval. The patient voices understanding of current disease status and treatment options and is in agreement with the current care plan. All questions were answered. The patient knows to call the clinic with any problems, questions or concerns. We can certainly see the patient much sooner if necessary.  Disclaimer: This note was dictated with voice recognition software. Similar sounding words can inadvertently be transcribed and may not be corrected upon review.

## 2018-01-11 NOTE — Patient Instructions (Signed)
[facility] Discharge Instructions for Patients Receiving Chemotherapy  Today you received the following chemotherapy agents :  Keytruda.  To help prevent nausea and vomiting after your treatment, we encourage you to take your nausea medication as prescribed.   If you develop nausea and vomiting that is not controlled by your nausea medication, call the clinic.   BELOW ARE SYMPTOMS THAT SHOULD BE REPORTED IMMEDIATELY:  *FEVER GREATER THAN 100.5 F  *CHILLS WITH OR WITHOUT FEVER  NAUSEA AND VOMITING THAT IS NOT CONTROLLED WITH YOUR NAUSEA MEDICATION  *UNUSUAL SHORTNESS OF BREATH  *UNUSUAL BRUISING OR BLEEDING  TENDERNESS IN MOUTH AND THROAT WITH OR WITHOUT PRESENCE OF ULCERS  *URINARY PROBLEMS  *BOWEL PROBLEMS  UNUSUAL RASH Items with * indicate a potential emergency and should be followed up as soon as possible.  Feel free to call the clinic should you have any questions or concerns. The clinic phone number is [phone].  Please show the CHEMO ALERT CARD at check-in to the Emergency Department and triage nurse.  

## 2018-01-31 ENCOUNTER — Ambulatory Visit (HOSPITAL_COMMUNITY)
Admission: RE | Admit: 2018-01-31 | Discharge: 2018-01-31 | Disposition: A | Discharge status: Home / Self Care | Payer: Medicare Other | Source: Ambulatory Visit | Attending: Internal Medicine | Admitting: Internal Medicine

## 2018-01-31 ENCOUNTER — Encounter (HOSPITAL_COMMUNITY): Payer: Self-pay | Admitting: Radiology

## 2018-01-31 DIAGNOSIS — C7972 Secondary malignant neoplasm of left adrenal gland: Secondary | ICD-10-CM | POA: Insufficient documentation

## 2018-01-31 DIAGNOSIS — R591 Generalized enlarged lymph nodes: Secondary | ICD-10-CM | POA: Diagnosis not present

## 2018-01-31 DIAGNOSIS — J90 Pleural effusion, not elsewhere classified: Secondary | ICD-10-CM | POA: Diagnosis not present

## 2018-01-31 DIAGNOSIS — C7971 Secondary malignant neoplasm of right adrenal gland: Secondary | ICD-10-CM | POA: Insufficient documentation

## 2018-01-31 DIAGNOSIS — I700 Atherosclerosis of aorta: Secondary | ICD-10-CM | POA: Insufficient documentation

## 2018-01-31 DIAGNOSIS — J432 Centrilobular emphysema: Secondary | ICD-10-CM | POA: Insufficient documentation

## 2018-01-31 DIAGNOSIS — C3490 Malignant neoplasm of unspecified part of unspecified bronchus or lung: Secondary | ICD-10-CM | POA: Diagnosis present

## 2018-01-31 DIAGNOSIS — C7800 Secondary malignant neoplasm of unspecified lung: Secondary | ICD-10-CM | POA: Insufficient documentation

## 2018-01-31 MED ORDER — IOHEXOL 300 MG/ML  SOLN
100.0000 mL | Freq: Once | INTRAMUSCULAR | Status: AC | PRN
  Administered 2018-01-31: 100 mL via INTRAVENOUS

## 2018-02-01 ENCOUNTER — Inpatient Hospital Stay: Payer: Medicare Other | Attending: Internal Medicine

## 2018-02-01 ENCOUNTER — Encounter: Payer: Self-pay | Admitting: Oncology

## 2018-02-01 ENCOUNTER — Inpatient Hospital Stay (HOSPITAL_BASED_OUTPATIENT_CLINIC_OR_DEPARTMENT_OTHER): Payer: Medicare Other | Admitting: Oncology

## 2018-02-01 ENCOUNTER — Telehealth: Payer: Self-pay | Admitting: Oncology

## 2018-02-01 ENCOUNTER — Inpatient Hospital Stay: Payer: Medicare Other

## 2018-02-01 VITALS — BP 122/70 | HR 104 | Temp 98.2°F | Resp 18 | Ht 69.5 in | Wt 152.0 lb

## 2018-02-01 DIAGNOSIS — I10 Essential (primary) hypertension: Secondary | ICD-10-CM

## 2018-02-01 DIAGNOSIS — C3411 Malignant neoplasm of upper lobe, right bronchus or lung: Secondary | ICD-10-CM | POA: Diagnosis not present

## 2018-02-01 DIAGNOSIS — C3491 Malignant neoplasm of unspecified part of right bronchus or lung: Secondary | ICD-10-CM

## 2018-02-01 DIAGNOSIS — Z79899 Other long term (current) drug therapy: Secondary | ICD-10-CM

## 2018-02-01 DIAGNOSIS — J910 Malignant pleural effusion: Secondary | ICD-10-CM | POA: Insufficient documentation

## 2018-02-01 DIAGNOSIS — Z5112 Encounter for antineoplastic immunotherapy: Secondary | ICD-10-CM

## 2018-02-01 LAB — CBC WITH DIFFERENTIAL/PLATELET
BASOS PCT: 0 %
Basophils Absolute: 0 10*3/uL (ref 0.0–0.1)
EOS ABS: 0.1 10*3/uL (ref 0.0–0.5)
EOS PCT: 2 %
HCT: 46.9 % (ref 38.4–49.9)
HEMOGLOBIN: 15 g/dL (ref 13.0–17.1)
LYMPHS ABS: 1.4 10*3/uL (ref 0.9–3.3)
Lymphocytes Relative: 30 %
MCH: 27.4 pg (ref 27.2–33.4)
MCHC: 31.9 g/dL — AB (ref 32.0–36.0)
MCV: 85.8 fL (ref 79.3–98.0)
MONO ABS: 0.5 10*3/uL (ref 0.1–0.9)
MONOS PCT: 11 %
Neutro Abs: 2.8 10*3/uL (ref 1.5–6.5)
Neutrophils Relative %: 57 %
PLATELETS: 196 10*3/uL (ref 140–400)
RBC: 5.47 MIL/uL (ref 4.20–5.82)
RDW: 17.5 % — AB (ref 11.0–14.6)
WBC: 4.9 10*3/uL (ref 4.0–10.3)

## 2018-02-01 LAB — COMPREHENSIVE METABOLIC PANEL
ALT: 14 U/L (ref 0–44)
AST: 20 U/L (ref 15–41)
Albumin: 3.1 g/dL — ABNORMAL LOW (ref 3.5–5.0)
Alkaline Phosphatase: 83 U/L (ref 38–126)
Anion gap: 9 (ref 5–15)
BUN: 9 mg/dL (ref 8–23)
CALCIUM: 9.4 mg/dL (ref 8.9–10.3)
CHLORIDE: 101 mmol/L (ref 98–111)
CO2: 31 mmol/L (ref 22–32)
CREATININE: 0.85 mg/dL (ref 0.61–1.24)
GFR calc non Af Amer: >60 mL/min (ref >60)
GLUCOSE: 118 mg/dL — AB (ref 70–99)
Potassium: 4.5 mmol/L (ref 3.5–5.1)
SODIUM: 141 mmol/L (ref 135–145)
Total Bilirubin: 0.4 mg/dL (ref 0.3–1.2)
Total Protein: 7.2 g/dL (ref 6.5–8.1)

## 2018-02-01 LAB — TSH: TSH: 1.808 u[IU]/mL (ref 0.320–4.118)

## 2018-02-01 MED ORDER — SODIUM CHLORIDE 0.9 % IV SOLN
Freq: Once | INTRAVENOUS | Status: AC
  Administered 2018-02-01: 12:00:00 via INTRAVENOUS
  Filled 2018-02-01: qty 250

## 2018-02-01 MED ORDER — SODIUM CHLORIDE 0.9 % IV SOLN
200.0000 mg | Freq: Once | INTRAVENOUS | Status: AC
  Administered 2018-02-01: 200 mg via INTRAVENOUS
  Filled 2018-02-01: qty 8

## 2018-02-01 NOTE — Progress Notes (Signed)
[facility] OFFICE PROGRESS NOTE  [name], NP-C [place] [address]  DIAGNOSIS: Stage IV (T3, N1, M1a) non-small cell lung cancer, adenocarcinoma diagnosed in July 2018 and presented with a right hilar lymphadenopathy and malignant right pleural effusion diagnosed in July 2018. Molecular studies were negative for EGFR, ALK, ROS 1 and BRAF mutations. PDL 1 expression was 50%  PRIOR THERAPY: None  CURRENT THERAPY: Keytruda (pembrolizumab) 200 mg IV every 3 weeks, status post 15 cycles.  INTERVAL HISTORY: Bradley Maldonado 67 y.o. male returns for routine follow-up visit accompanied by his brother.  The patient is feeling fine today and has no specific complaints.  He denies fevers and chills.  Denies chest pain, shortness of breath, cough, hemoptysis.  Denies nausea, vomiting, constipation, diarrhea.  Denies recent weight loss or night sweats.  Denies headaches and visual disturbances.  He has been tolerating his treatment with Keytruda fairly well.  The patient is here for evaluation prior to cycle #16 and to review his restaging CT scan results.  MEDICAL HISTORY: Past Medical History:  Diagnosis Date  . Adenocarcinoma of right lung, stage 4 ([facility]) 01/24/2017  . Goals of care, counseling/discussion 01/24/2017  . Hypertension     ALLERGIES:  has No Known Allergies.  MEDICATIONS:  Current Outpatient Medications  Medication Sig Dispense Refill  . Omega-3 Fatty Acids (FISH OIL) 1200 MG CAPS Take 1,200 mg by mouth every other day.     No current facility-administered medications for this visit.     SURGICAL HISTORY:  Past Surgical History:  Procedure Laterality Date  . CHEST TUBE INSERTION Right 02/06/2017   Procedure: INSERTION PLEURAL DRAINAGE CATHETER;  Surgeon: Ivin Poot, MD;  Location: [facility];  Service: Thoracic;  Laterality: Right;  . EYE SURGERY Right    metal removed from eye  . Hip replacement    . IR THORACENTESIS ASP  PLEURAL SPACE W/IMG GUIDE  12/19/2016  . REMOVAL OF PLEURAL DRAINAGE CATHETER Right 03/21/2017   Procedure: REMOVAL OF RIGHT PLEURAL DRAINAGE CATHETER;  Surgeon: Ivin Poot, MD;  Location: [facility];  Service: Thoracic;  Laterality: Right;    REVIEW OF SYSTEMS:   Review of Systems  Constitutional: Negative for appetite change, chills, fatigue, fever and unexpected weight change.  HENT:   Negative for mouth sores, nosebleeds, sore throat and trouble swallowing.   Eyes: Negative for eye problems and icterus.  Respiratory: Negative for cough, hemoptysis, shortness of breath and wheezing.   Cardiovascular: Negative for chest pain and leg swelling.  Gastrointestinal: Negative for abdominal pain, constipation, diarrhea, nausea and vomiting.  Genitourinary: Negative for bladder incontinence, difficulty urinating, dysuria, frequency and hematuria.   Musculoskeletal: Negative for back pain, gait problem, neck pain and neck stiffness.  Skin: Negative for itching and rash.  Neurological: Negative for dizziness, extremity weakness, gait problem, headaches, light-headedness and seizures.  Hematological: Negative for adenopathy. Does not bruise/bleed easily.  Psychiatric/Behavioral: Negative for confusion, depression and sleep disturbance. The patient is not nervous/anxious.     PHYSICAL EXAMINATION:  Blood pressure 122/70, pulse (!) 104, temperature 98.2 F (36.8 C), temperature source Oral, resp. rate 18, height 5' 9.5" (1.765 m), weight 152 lb (68.9 kg), SpO2 92 %.  ECOG PERFORMANCE STATUS: 1 - Symptomatic but completely ambulatory  Physical Exam  Constitutional: Oriented to person, place, and time and well-developed, well-nourished, and in no distress. No distress.  HENT:  Head: Normocephalic and atraumatic.  Mouth/Throat: Oropharynx is clear and moist. No oropharyngeal exudate.  Eyes: Conjunctivae are normal. Right eye exhibits no discharge. Left eye exhibits no discharge. No scleral icterus.   Neck: Normal range of motion. Neck supple.  Cardiovascular: Normal rate, regular rhythm, normal heart sounds and intact distal pulses.   Pulmonary/Chest: Effort normal and breath sounds normal. No respiratory distress. No wheezes. No rales.  Abdominal: Soft. Bowel sounds are normal. Exhibits no distension and no mass. There is no tenderness.  Musculoskeletal: Normal range of motion. Exhibits no edema.  Lymphadenopathy:    No cervical adenopathy.  Neurological: Alert and oriented to person, place, and time. Exhibits normal muscle tone. Gait normal. Coordination normal.  Skin: Skin is warm and dry. No rash noted. Not diaphoretic. No erythema. No pallor.  Psychiatric: Mood, memory and judgment normal.  Vitals reviewed.  LABORATORY DATA: Lab Results  Component Value Date   WBC 4.9 02/01/2018   HGB 15.0 02/01/2018   HCT 46.9 02/01/2018   MCV 85.8 02/01/2018   PLT 196 02/01/2018      Chemistry      Component Value Date/Time   NA 141 02/01/2018 1104   NA 138 05/04/2017 1011   K 4.5 02/01/2018 1104   K 3.9 05/04/2017 1011   CL 101 02/01/2018 1104   CO2 31 02/01/2018 1104   CO2 24 05/04/2017 1011   BUN 9 02/01/2018 1104   BUN 18.4 05/04/2017 1011   CREATININE 0.85 02/01/2018 1104   CREATININE 1.0 05/04/2017 1011      Component Value Date/Time   CALCIUM 9.4 02/01/2018 1104   CALCIUM 9.3 05/04/2017 1011   ALKPHOS 83 02/01/2018 1104   ALKPHOS 91 05/04/2017 1011   AST 20 02/01/2018 1104   AST 23 05/04/2017 1011   ALT 14 02/01/2018 1104   ALT 19 05/04/2017 1011   BILITOT 0.4 02/01/2018 1104   BILITOT 0.39 05/04/2017 1011       RADIOGRAPHIC STUDIES:  Ct Chest W Contrast  Result Date: 01/31/2018 CLINICAL DATA:  Stage IV right lung adenocarcinoma diagnosed July 2018 presents for restaging with ongoing immunotherapy. EXAM: CT CHEST, ABDOMEN, AND PELVIS WITH CONTRAST TECHNIQUE: Multidetector CT imaging of the chest, abdomen and pelvis was performed following the standard  protocol during bolus administration of intravenous contrast. CONTRAST:  133m OMNIPAQUE IOHEXOL 300 MG/ML  SOLN COMPARISON:  11/28/2017 CT chest, abdomen and pelvis. FINDINGS: CT CHEST FINDINGS Cardiovascular: Normal heart size. No significant pericardial effusion/thickening. Three-vessel coronary atherosclerosis. Atherosclerotic nonaneurysmal thoracic aorta. Normal caliber pulmonary arteries. No central pulmonary emboli. Mediastinum/Nodes: No discrete thyroid nodules. Unremarkable esophagus. Mild right axillary adenopathy measuring up to 1.1 cm (series 2/image 33), previously 1.1 cm, stable. No left axillary adenopathy. Enlarged 1.0 cm right pericardiophrenic node (series 2/image 55), stable using similar measurement technique. Mildly enlarged 1.1 cm AP window node (series 2/image 29), previously 1.0 cm using similar measurement technique, not appreciably changed. Stable enlarged 1.8 cm subcarinal node (series 2/image 35). Stable enlarged 1.1 cm right hilar node (series 2/image 31). No pathologically enlarged left hilar nodes. Lungs/Pleura: No pneumothorax. Prominent volume loss in the right hemithorax, stable. Circumferential irregular prominent right pleural thickening and hyperenhancement throughout the right pleural space with loculated small dependent and subpulmonic right pleural effusion, not appreciably changed. Severe centrilobular emphysema with mild diffuse bronchial wall thickening. Interlobular septal thickening throughout the right lung, unchanged. Consolidation throughout much of the right middle and right lower lobes, suspect a combination of thick tumor and atelectasis, not appreciably changed. Numerous (greater than 10) pulmonary nodules scattered throughout the left lung, stable to mildly increased  in size. For example a medial basilar left lower lobe 8 mm nodule (series 7/image 135), increased from 6 mm. Left lower lobe 7 mm nodule (series 7/image 125), minimally increased from 6 mm. Lingular 7  mm nodule (series 7/image 99), minimally increased from 6 mm. Posterior left upper lobe 7 mm nodule (series 7/image 67), stable using similar measurement technique. No new significant pulmonary nodules. Musculoskeletal: No aggressive appearing focal osseous lesions. Mild thoracic spondylosis. CT ABDOMEN PELVIS FINDINGS Hepatobiliary: Normal liver size. No liver mass. Normal gallbladder with no radiopaque cholelithiasis. No biliary ductal dilatation. Pancreas: Normal, with no mass or duct dilation. Spleen: Normal size. No mass. Adrenals/Urinary Tract: Stable 2.1 cm right adrenal (series 2/image 67) and 1.2 cm left adrenal (series 2/image 63) nodules. No hydronephrosis. No renal masses. Bladder obscured by streak artifact from right hip hardware with no gross bladder abnormality. Stomach/Bowel: Normal non-distended stomach. Normal caliber small bowel with no small bowel wall thickening. Normal appendix. Normal large bowel with no diverticulosis, large bowel wall thickening or pericolonic fat stranding. Vascular/Lymphatic: Atherosclerotic nonaneurysmal abdominal aorta. Patent portal, splenic, hepatic and renal veins. Aortocaval adenopathy measures up to 1.7 cm (series 2/image 80), previously 1.6 cm, not appreciably changed. Enlarged bulky 4.4 cm right common iliac node (series 2/image 89), mildly increased from 4.1 cm using similar measurement technique. Right external iliac adenopathy measuring up to 2.8 cm (series 2/image 110), increased from 2.6 cm. No left pelvic adenopathy. Reproductive: Top-normal size prostate. Other: No pneumoperitoneum, ascites or focal fluid collection. Musculoskeletal: No aggressive appearing focal osseous lesions. Right total hip arthroplasty is partially visualized. Advanced left hip osteoarthritis. Marked lumbar spondylosis. IMPRESSION: 1. Overall, findings are stable to mildly progressed. 2. Extensive circumferential tumor throughout the right pleural space is stable. Loculated small  dependent/subpulmonic right pleural effusion is stable. Lymphangitic tumor and consolidative tumor/atelectasis throughout the right lung is stable. 3. Numerous contralateral pulmonary metastases are stable to mildly increased in size. 4. Right axillary, mediastinal and right hilar adenopathy is stable. 5. Bilateral adrenal metastases are stable. 6. Bulky right pelvic adenopathy is mildly increased. Mild-to-moderate retroperitoneal adenopathy is stable. 7. Aortic Atherosclerosis (ICD10-I70.0) and Emphysema (ICD10-J43.9). Electronically Signed   By: Ilona Sorrel M.D.   On: 01/31/2018 17:04   Ct Abdomen Pelvis W Contrast  Result Date: 01/31/2018 CLINICAL DATA:  Stage IV right lung adenocarcinoma diagnosed July 2018 presents for restaging with ongoing immunotherapy. EXAM: CT CHEST, ABDOMEN, AND PELVIS WITH CONTRAST TECHNIQUE: Multidetector CT imaging of the chest, abdomen and pelvis was performed following the standard protocol during bolus administration of intravenous contrast. CONTRAST:  144m OMNIPAQUE IOHEXOL 300 MG/ML  SOLN COMPARISON:  11/28/2017 CT chest, abdomen and pelvis. FINDINGS: CT CHEST FINDINGS Cardiovascular: Normal heart size. No significant pericardial effusion/thickening. Three-vessel coronary atherosclerosis. Atherosclerotic nonaneurysmal thoracic aorta. Normal caliber pulmonary arteries. No central pulmonary emboli. Mediastinum/Nodes: No discrete thyroid nodules. Unremarkable esophagus. Mild right axillary adenopathy measuring up to 1.1 cm (series 2/image 33), previously 1.1 cm, stable. No left axillary adenopathy. Enlarged 1.0 cm right pericardiophrenic node (series 2/image 55), stable using similar measurement technique. Mildly enlarged 1.1 cm AP window node (series 2/image 29), previously 1.0 cm using similar measurement technique, not appreciably changed. Stable enlarged 1.8 cm subcarinal node (series 2/image 35). Stable enlarged 1.1 cm right hilar node (series 2/image 31). No  pathologically enlarged left hilar nodes. Lungs/Pleura: No pneumothorax. Prominent volume loss in the right hemithorax, stable. Circumferential irregular prominent right pleural thickening and hyperenhancement throughout the right pleural space with loculated small dependent and  subpulmonic right pleural effusion, not appreciably changed. Severe centrilobular emphysema with mild diffuse bronchial wall thickening. Interlobular septal thickening throughout the right lung, unchanged. Consolidation throughout much of the right middle and right lower lobes, suspect a combination of thick tumor and atelectasis, not appreciably changed. Numerous (greater than 10) pulmonary nodules scattered throughout the left lung, stable to mildly increased in size. For example a medial basilar left lower lobe 8 mm nodule (series 7/image 135), increased from 6 mm. Left lower lobe 7 mm nodule (series 7/image 125), minimally increased from 6 mm. Lingular 7 mm nodule (series 7/image 99), minimally increased from 6 mm. Posterior left upper lobe 7 mm nodule (series 7/image 67), stable using similar measurement technique. No new significant pulmonary nodules. Musculoskeletal: No aggressive appearing focal osseous lesions. Mild thoracic spondylosis. CT ABDOMEN PELVIS FINDINGS Hepatobiliary: Normal liver size. No liver mass. Normal gallbladder with no radiopaque cholelithiasis. No biliary ductal dilatation. Pancreas: Normal, with no mass or duct dilation. Spleen: Normal size. No mass. Adrenals/Urinary Tract: Stable 2.1 cm right adrenal (series 2/image 67) and 1.2 cm left adrenal (series 2/image 63) nodules. No hydronephrosis. No renal masses. Bladder obscured by streak artifact from right hip hardware with no gross bladder abnormality. Stomach/Bowel: Normal non-distended stomach. Normal caliber small bowel with no small bowel wall thickening. Normal appendix. Normal large bowel with no diverticulosis, large bowel wall thickening or pericolonic  fat stranding. Vascular/Lymphatic: Atherosclerotic nonaneurysmal abdominal aorta. Patent portal, splenic, hepatic and renal veins. Aortocaval adenopathy measures up to 1.7 cm (series 2/image 80), previously 1.6 cm, not appreciably changed. Enlarged bulky 4.4 cm right common iliac node (series 2/image 89), mildly increased from 4.1 cm using similar measurement technique. Right external iliac adenopathy measuring up to 2.8 cm (series 2/image 110), increased from 2.6 cm. No left pelvic adenopathy. Reproductive: Top-normal size prostate. Other: No pneumoperitoneum, ascites or focal fluid collection. Musculoskeletal: No aggressive appearing focal osseous lesions. Right total hip arthroplasty is partially visualized. Advanced left hip osteoarthritis. Marked lumbar spondylosis. IMPRESSION: 1. Overall, findings are stable to mildly progressed. 2. Extensive circumferential tumor throughout the right pleural space is stable. Loculated small dependent/subpulmonic right pleural effusion is stable. Lymphangitic tumor and consolidative tumor/atelectasis throughout the right lung is stable. 3. Numerous contralateral pulmonary metastases are stable to mildly increased in size. 4. Right axillary, mediastinal and right hilar adenopathy is stable. 5. Bilateral adrenal metastases are stable. 6. Bulky right pelvic adenopathy is mildly increased. Mild-to-moderate retroperitoneal adenopathy is stable. 7. Aortic Atherosclerosis (ICD10-I70.0) and Emphysema (ICD10-J43.9). Electronically Signed   By: Ilona Sorrel M.D.   On: 01/31/2018 17:04     ASSESSMENT/PLAN:  Adenocarcinoma of right lung, stage 4 ([facility]) This is a very pleasant 67 year old African-American male with a stage IV non-small cell lung cancer, adenocarcinoma with PDL 1 expression of 50% and negative for actionable mutations. The patient is currently undergoing treatment with immunotherapy with Keytruda 200 mg IV every 3 weeks status post 15 cycles.  He tolerated the last  cycle of his treatment well with no concerning complaints. He had a restaging CT scan and is here to discuss results.  The patient was seen with Dr. Julien Nordmann.  CT scan results were discussed with the patient and his brother which showed no evidence of disease progression.  Recommend that he continue on his current course of therapy.  He will proceed with cycle #16 of Keytruda today as scheduled.  The patient will follow-up in 3 weeks for evaluation prior to cycle #17.  The patient was  advised to call immediately if he has any concerning symptoms in the interval. The patient voices understanding of current disease status and treatment options and is in agreement with the current care plan. All questions were answered. The patient knows to call the clinic with any problems, questions or concerns. We can certainly see the patient much sooner if necessary.   No orders of the defined types were placed in this encounter.    Bradley Bussing, DNP, AGPCNP-BC, AOCNP 02/01/18    ADDENDUM: Hematology/Oncology Attending: I had a face-to-face encounter with the patient.  I recommended his care plan.  This is a very pleasant 67 years old African-American male with metastatic non-small cell lung cancer, adenocarcinoma with PDL 1 expression of 50%.  The patient is currently on treatment with single agent Ketruda (pembrolizumab) status post 15 cycles.  He has been tolerating this treatment well with no concerning adverse effects. He had repeat CT scan of the chest, abdomen and pelvis performed recently.  I personally and independently reviewed the scan results with the patient and his brother. His a scan showed no concerning evidence for disease progression except for mild increase in some of the lesion. I recommended for the patient to continue his current treatment with immunotherapy and he will proceed with cycle #16 today. He will come back for follow-up visit in 3 weeks for evaluation before starting cycle  #17. The patient was advised to call immediately if he has any concerning symptoms in the interval.  Disclaimer: This note was dictated with voice recognition software. Similar sounding words can inadvertently be transcribed and may be missed upon review. Bradley Kempf, MD 02/03/18

## 2018-02-01 NOTE — Telephone Encounter (Signed)
Scheduled appt per 9/12 los -pt to get an updated schedule next visit.

## 2018-02-01 NOTE — Patient Instructions (Signed)
[facility] Discharge Instructions for Patients Receiving Chemotherapy  Today you received the following chemotherapy agents:  Keytruda.  To help prevent nausea and vomiting after your treatment, we encourage you to take your nausea medication as directed.   If you develop nausea and vomiting that is not controlled by your nausea medication, call the clinic.   BELOW ARE SYMPTOMS THAT SHOULD BE REPORTED IMMEDIATELY:  *FEVER GREATER THAN 100.5 F  *CHILLS WITH OR WITHOUT FEVER  NAUSEA AND VOMITING THAT IS NOT CONTROLLED WITH YOUR NAUSEA MEDICATION  *UNUSUAL SHORTNESS OF BREATH  *UNUSUAL BRUISING OR BLEEDING  TENDERNESS IN MOUTH AND THROAT WITH OR WITHOUT PRESENCE OF ULCERS  *URINARY PROBLEMS  *BOWEL PROBLEMS  UNUSUAL RASH Items with * indicate a potential emergency and should be followed up as soon as possible.  Feel free to call the clinic should you have any questions or concerns. The clinic phone number is [phone].  Please show the CHEMO ALERT CARD at check-in to the Emergency Department and triage nurse.    

## 2018-02-01 NOTE — Assessment & Plan Note (Signed)
This is a very pleasant 67 year old African-American male with a stage IV non-small cell lung cancer, adenocarcinoma with PDL 1 expression of 50% and negative for actionable mutations. The patient is currently undergoing treatment with immunotherapy with Keytruda 200 mg IV every 3 weeks status post 15 cycles.  He tolerated the last cycle of his treatment well with no concerning complaints. He had a restaging CT scan and is here to discuss results.  The patient was seen with Dr. Julien Nordmann.  CT scan results were discussed with the patient and his brother which showed no evidence of disease progression.  Recommend that he continue on his current course of therapy.  He will proceed with cycle #16 of Keytruda today as scheduled.  The patient will follow-up in 3 weeks for evaluation prior to cycle #17.  The patient was advised to call immediately if he has any concerning symptoms in the interval. The patient voices understanding of current disease status and treatment options and is in agreement with the current care plan. All questions were answered. The patient knows to call the clinic with any problems, questions or concerns. We can certainly see the patient much sooner if necessary.

## 2018-02-08 ENCOUNTER — Encounter: Payer: Self-pay | Admitting: Family Medicine

## 2018-02-08 ENCOUNTER — Ambulatory Visit (INDEPENDENT_AMBULATORY_CARE_PROVIDER_SITE_OTHER): Payer: Medicare Other | Admitting: Family Medicine

## 2018-02-08 VITALS — BP 122/80 | HR 87 | Resp 16 | Wt 152.2 lb

## 2018-02-08 DIAGNOSIS — J069 Acute upper respiratory infection, unspecified: Secondary | ICD-10-CM | POA: Diagnosis not present

## 2018-02-08 DIAGNOSIS — R058 Other specified cough: Secondary | ICD-10-CM

## 2018-02-08 DIAGNOSIS — J449 Chronic obstructive pulmonary disease, unspecified: Secondary | ICD-10-CM | POA: Diagnosis not present

## 2018-02-08 DIAGNOSIS — R05 Cough: Secondary | ICD-10-CM

## 2018-02-08 DIAGNOSIS — C3491 Malignant neoplasm of unspecified part of right bronchus or lung: Secondary | ICD-10-CM | POA: Diagnosis not present

## 2018-02-08 DIAGNOSIS — H938X2 Other specified disorders of left ear: Secondary | ICD-10-CM

## 2018-02-08 MED ORDER — AZITHROMYCIN 250 MG PO TABS: ORAL_TABLET | ORAL | 0 refills | Status: DC

## 2018-02-08 NOTE — Patient Instructions (Addendum)
Take the antibiotic as prescribed. Stay well hydrated. You may take plain Mucinex if needed for cough and congestion.   If you develop fever, shortness of breath, chest pain, worsening symptoms then you should be seen again and should contact your oncologist as well.   You will receive a call from the ENT for the persistent fullness in your left ear.

## 2018-02-08 NOTE — Progress Notes (Signed)
Subjective:  Bradley Maldonado is a 67 y.o. male with a history of stage 4 lung cancer and COPD who presents for complaints of 10 day history of cough and congestion. States he is coughing up mostly clear sputum.  Notes occasional wheezing. Also reports some mild rhinorrhea and nasal congestion.  States he is not smoking. Has not taken anything for his symptoms.   Denies fever, chills, chest pain, palpitations, worsening shortness of breath, abdominal pain, N/V/D.   Reports persistent sensation of fullness in his left ear. No headache or dizziness. denies ear pain, sore throat, post nasal drainage.  Requests referral to ENT.   Denies sick contacts.  No other aggravating or relieving factors.  No other c/o.  States he went to the eye doctor and is scheduled to go back in 3 months.   States he stopped doing cocaine.   ROS as in subjective.   Objective: Vitals:   02/08/18 1037  BP: 122/80  Pulse: 87  Resp: 16  SpO2: 98%    General appearance: Alert, WD/WN, no distress, mildly ill appearing                             Skin: warm, no rash                           Head: no sinus tenderness                            Eyes: conjunctiva normal, corneas clear, PERRLA                            Ears: pearly TMs, external ear canals normal                          Nose: septum midline, turbinates swollen, with erythema and clear discharge             Mouth/throat: MMM, tongue normal, mild pharyngeal erythema                           Neck: supple, no adenopathy, no thyromegaly, nontender                          Heart: RRR, normal S1, S2, no murmurs                         Lungs: distant wheezes, no rales, or rhonchi      Assessment: URI with cough and congestion - Plan: azithromycin (ZITHROMAX) 250 MG tablet  Adenocarcinoma of right lung, stage 4 (HCC)  Chronic obstructive pulmonary disease, unspecified COPD type (HCC)  Cough productive of clear sputum - Plan: azithromycin  (ZITHROMAX) 250 MG tablet  Sensation of fullness in left ear - Plan: Ambulatory referral to ENT    Plan: Discussed diagnosis and treatment of cough and congestion. Vitals are WNL. No acute distress. Z-pak prescribed.   Suggested symptomatic OTC remedies such as Mucinex.  Nasal saline spray for congestion.  ENT referral for persistent sensation of fullness in left ear.  [name] aware that if symptoms worsen that he will need to be seen right away by me or his oncologist.

## 2018-02-12 ENCOUNTER — Other Ambulatory Visit: Payer: Self-pay | Admitting: Ophthalmology

## 2018-02-12 DIAGNOSIS — H05242 Constant exophthalmos, left eye: Secondary | ICD-10-CM

## 2018-02-13 IMAGING — CR DG CHEST 2V
2 series · 2 of 2 positions shown · non-contrast
Comparison: 03/01/2017

CLINICAL DATA: Right lung cancer

EXAM:
CHEST  2 VIEW

[w chest pa]
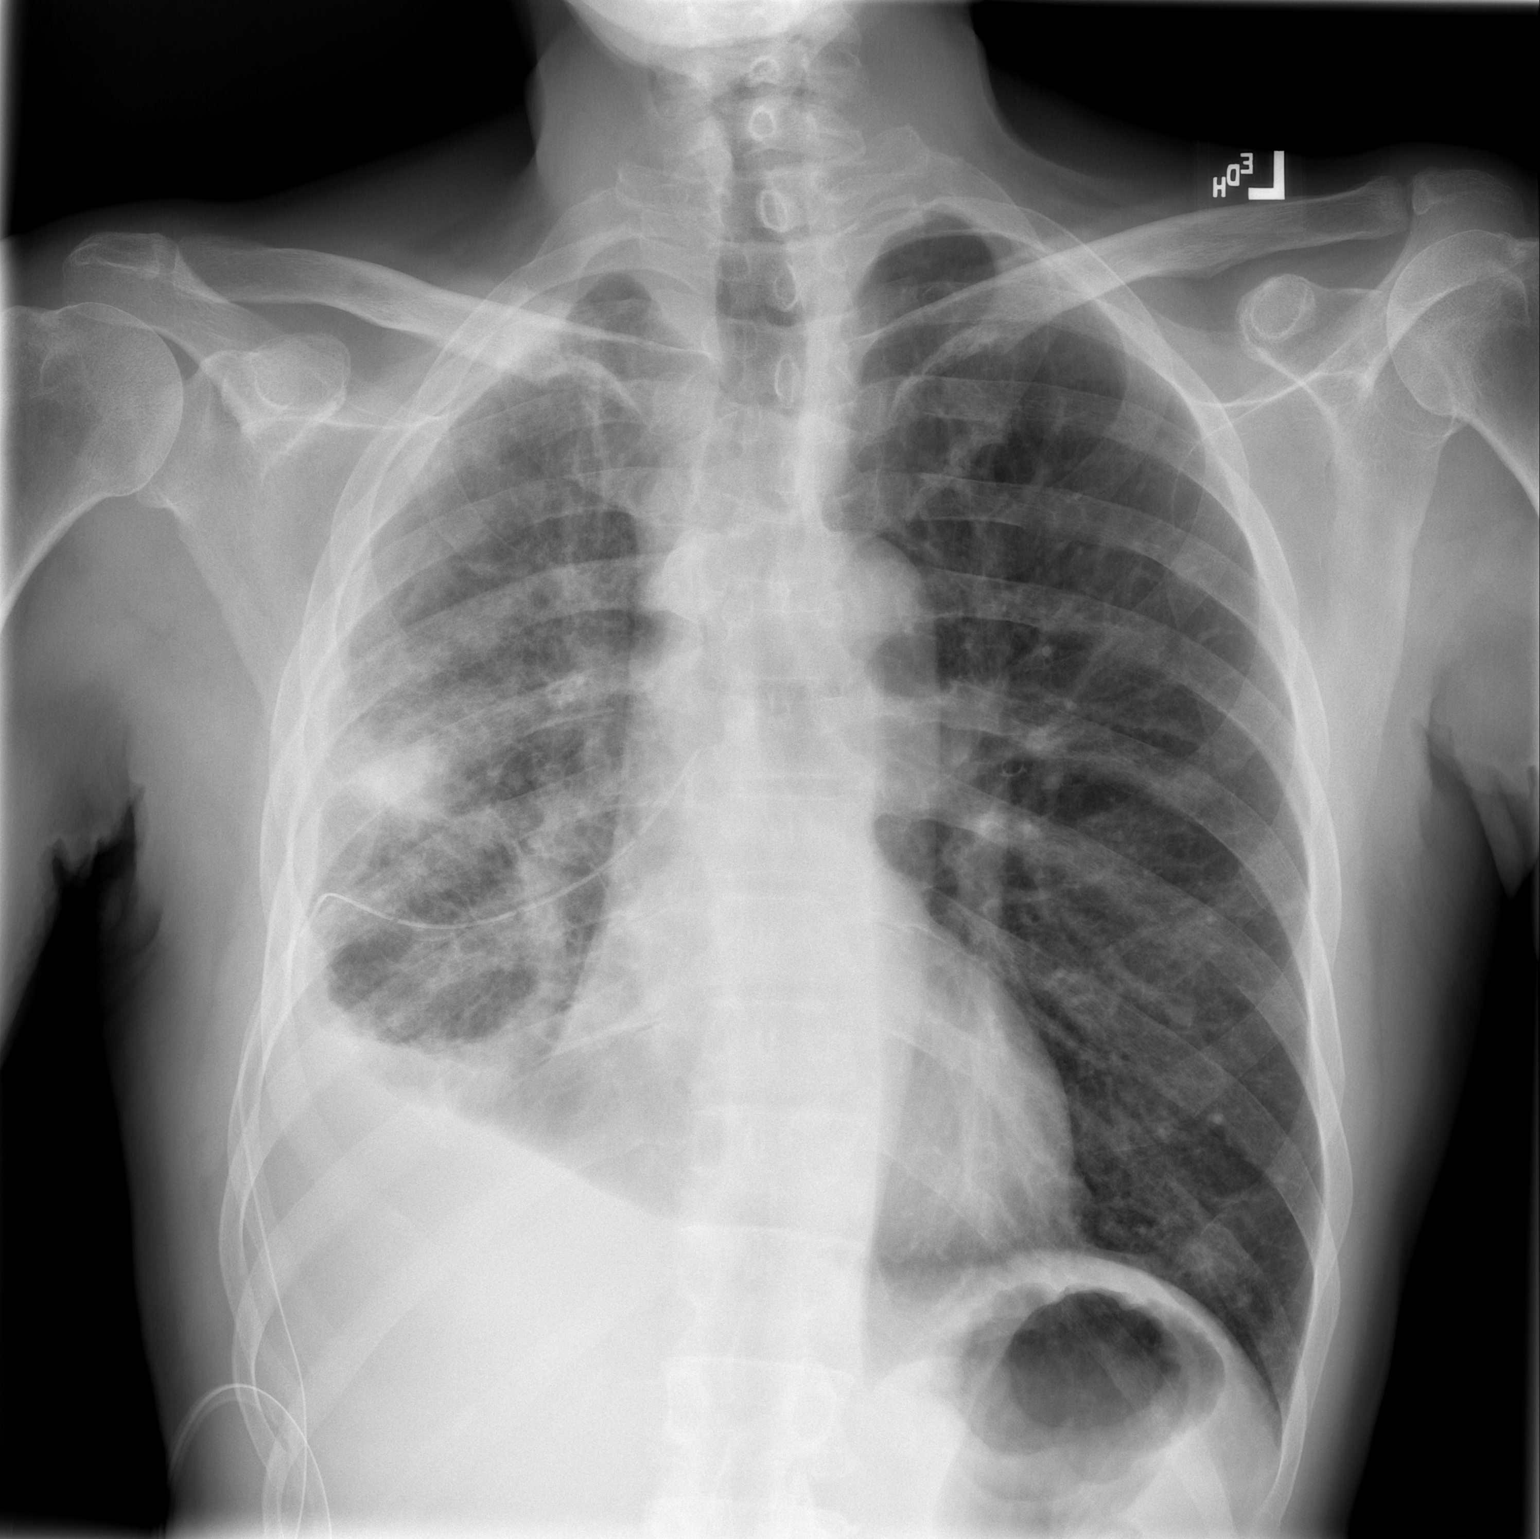

[w chest lat]
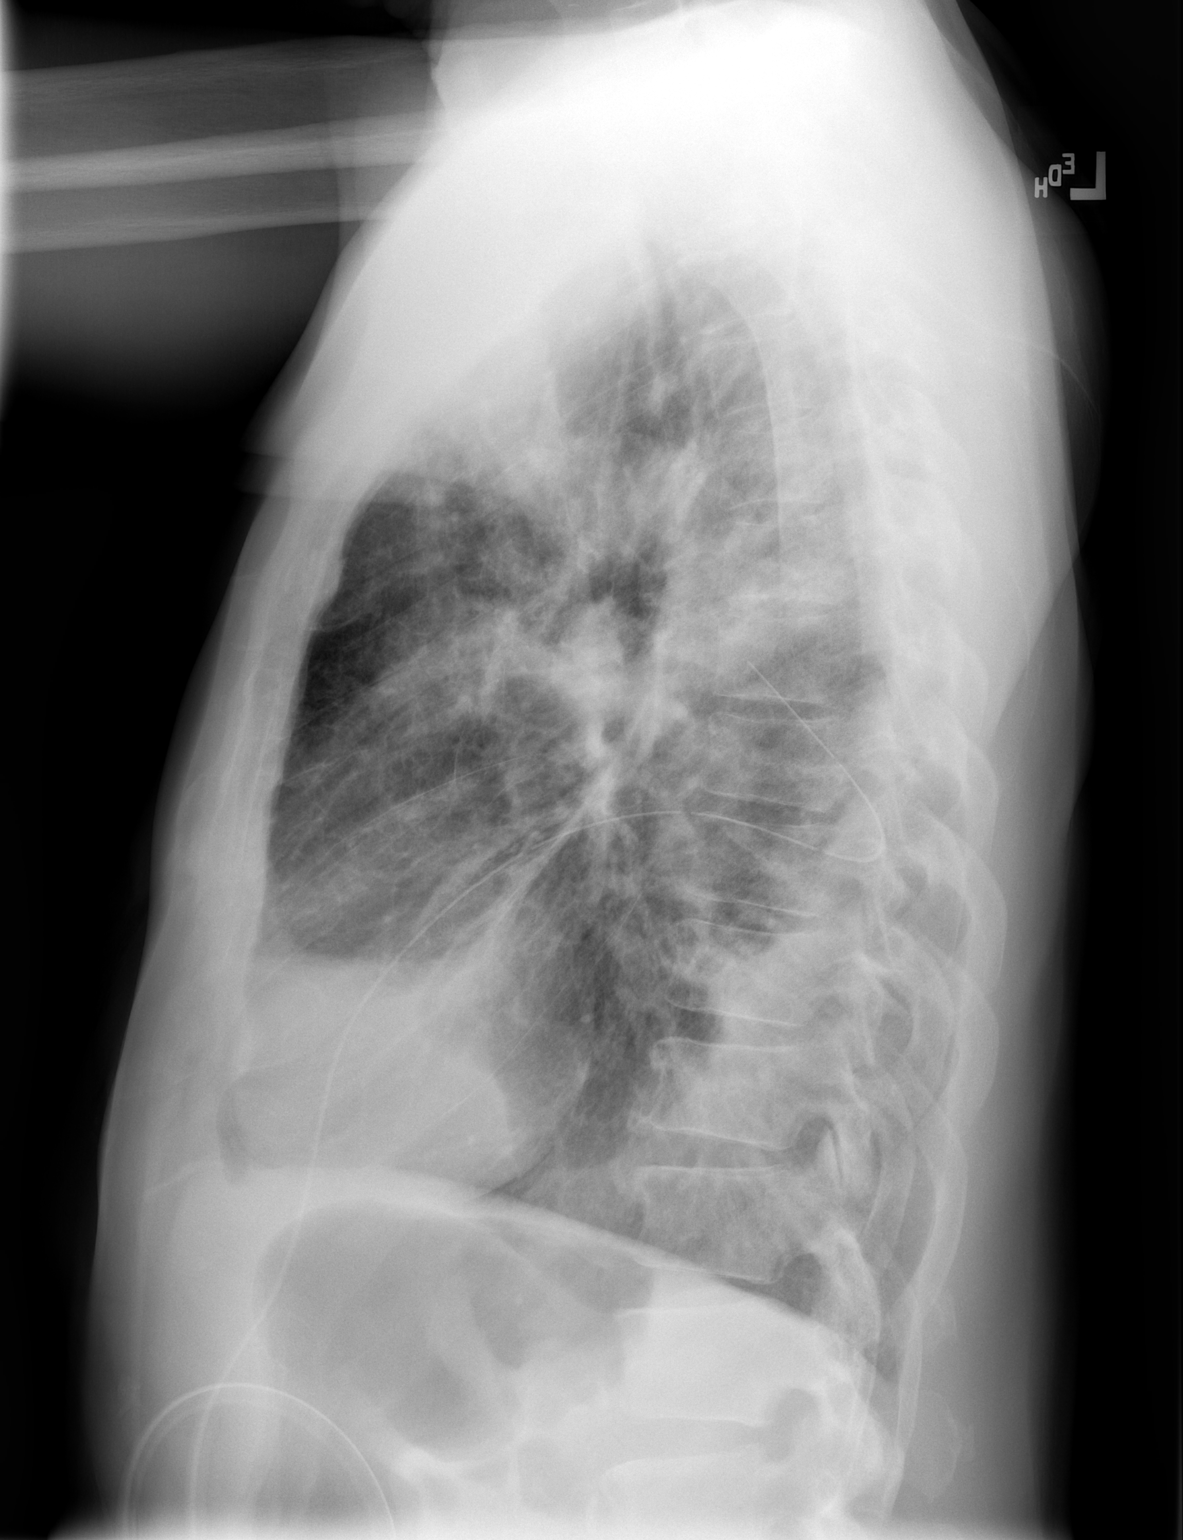

[2 of 2 positions shown; findings below may reference images not displayed]

FINDINGS: Persistent right upper lobe mass. Interstitial thickening of the
right lung. Small right pleural effusion. Right-sided chest tube in
satisfactory position. No pneumothorax. Left lung is clear. Stable
cardiomediastinal silhouette. No acute osseous abnormality.
IMPRESSION: 1. No significant interval change compared with 03/01/2017.
2. Stable persistent right upper lobe mass most consistent with lung
malignancy. Stable small right pleural effusion.

## 2018-02-16 ENCOUNTER — Other Ambulatory Visit: Payer: Medicare Other

## 2018-02-21 ENCOUNTER — Encounter: Payer: Self-pay | Admitting: Internal Medicine

## 2018-02-21 ENCOUNTER — Other Ambulatory Visit: Payer: Self-pay | Admitting: Medical Oncology

## 2018-02-21 DIAGNOSIS — C3491 Malignant neoplasm of unspecified part of right bronchus or lung: Secondary | ICD-10-CM

## 2018-02-22 ENCOUNTER — Inpatient Hospital Stay: Payer: Medicare Other

## 2018-02-22 ENCOUNTER — Telehealth: Payer: Self-pay | Admitting: Oncology

## 2018-02-22 ENCOUNTER — Inpatient Hospital Stay (HOSPITAL_BASED_OUTPATIENT_CLINIC_OR_DEPARTMENT_OTHER): Payer: Medicare Other | Admitting: Internal Medicine

## 2018-02-22 ENCOUNTER — Telehealth: Payer: Self-pay | Admitting: Internal Medicine

## 2018-02-22 ENCOUNTER — Inpatient Hospital Stay: Payer: Medicare Other | Attending: Internal Medicine

## 2018-02-22 ENCOUNTER — Encounter: Payer: Self-pay | Admitting: Internal Medicine

## 2018-02-22 VITALS — HR 97 | Resp 18

## 2018-02-22 VITALS — BP 157/82 | HR 102 | Temp 97.7°F | Resp 21 | Ht 69.5 in | Wt 153.0 lb

## 2018-02-22 DIAGNOSIS — J910 Malignant pleural effusion: Secondary | ICD-10-CM | POA: Diagnosis not present

## 2018-02-22 DIAGNOSIS — R5383 Other fatigue: Secondary | ICD-10-CM | POA: Diagnosis not present

## 2018-02-22 DIAGNOSIS — I2699 Other pulmonary embolism without acute cor pulmonale: Secondary | ICD-10-CM | POA: Diagnosis not present

## 2018-02-22 DIAGNOSIS — Z79899 Other long term (current) drug therapy: Secondary | ICD-10-CM | POA: Diagnosis not present

## 2018-02-22 DIAGNOSIS — C3411 Malignant neoplasm of upper lobe, right bronchus or lung: Secondary | ICD-10-CM

## 2018-02-22 DIAGNOSIS — C3491 Malignant neoplasm of unspecified part of right bronchus or lung: Secondary | ICD-10-CM

## 2018-02-22 DIAGNOSIS — C7931 Secondary malignant neoplasm of brain: Secondary | ICD-10-CM | POA: Diagnosis not present

## 2018-02-22 DIAGNOSIS — Z5112 Encounter for antineoplastic immunotherapy: Secondary | ICD-10-CM

## 2018-02-22 DIAGNOSIS — Z7901 Long term (current) use of anticoagulants: Secondary | ICD-10-CM | POA: Insufficient documentation

## 2018-02-22 DIAGNOSIS — I10 Essential (primary) hypertension: Secondary | ICD-10-CM | POA: Insufficient documentation

## 2018-02-22 LAB — CMP (CANCER CENTER ONLY)
ALK PHOS: 90 U/L (ref 38–126)
ALT: 13 U/L (ref 0–44)
AST: 20 U/L (ref 15–41)
Albumin: 2.9 g/dL — ABNORMAL LOW (ref 3.5–5.0)
Anion gap: 7 (ref 5–15)
BUN: 9 mg/dL (ref 8–23)
CALCIUM: 8.9 mg/dL (ref 8.9–10.3)
CO2: 30 mmol/L (ref 22–32)
CREATININE: 0.78 mg/dL (ref 0.61–1.24)
Chloride: 103 mmol/L (ref 98–111)
Glucose, Bld: 113 mg/dL — ABNORMAL HIGH (ref 70–99)
Potassium: 4.1 mmol/L (ref 3.5–5.1)
SODIUM: 140 mmol/L (ref 135–145)
TOTAL PROTEIN: 6.8 g/dL (ref 6.5–8.1)
Total Bilirubin: 0.3 mg/dL (ref 0.3–1.2)

## 2018-02-22 LAB — CBC WITH DIFFERENTIAL (CANCER CENTER ONLY)
Basophils Absolute: 0 10*3/uL (ref 0.0–0.1)
Basophils Relative: 1 %
EOS ABS: 0.1 10*3/uL (ref 0.0–0.5)
Eosinophils Relative: 3 %
HEMATOCRIT: 45.5 % (ref 38.4–49.9)
HEMOGLOBIN: 14.7 g/dL (ref 13.0–17.1)
LYMPHS ABS: 1.3 10*3/uL (ref 0.9–3.3)
Lymphocytes Relative: 28 %
MCH: 27.7 pg (ref 27.2–33.4)
MCHC: 32.3 g/dL (ref 32.0–36.0)
MCV: 86 fL (ref 79.3–98.0)
MONO ABS: 0.5 10*3/uL (ref 0.1–0.9)
MONOS PCT: 10 %
NEUTROS ABS: 2.9 10*3/uL (ref 1.5–6.5)
NEUTROS PCT: 58 %
Platelet Count: 177 10*3/uL (ref 140–400)
RBC: 5.29 MIL/uL (ref 4.20–5.82)
RDW: 16.7 % — AB (ref 11.0–14.6)
WBC: 4.9 10*3/uL (ref 4.0–10.3)

## 2018-02-22 MED ORDER — SODIUM CHLORIDE 0.9 % IV SOLN
200.0000 mg | Freq: Once | INTRAVENOUS | Status: AC
  Administered 2018-02-22: 200 mg via INTRAVENOUS
  Filled 2018-02-22: qty 8

## 2018-02-22 MED ORDER — SODIUM CHLORIDE 0.9 % IV SOLN
Freq: Once | INTRAVENOUS | Status: AC
  Administered 2018-02-22: 11:00:00 via INTRAVENOUS
  Filled 2018-02-22: qty 250

## 2018-02-22 NOTE — Progress Notes (Signed)
[facility] [phone]   [phone]  OFFICE PROGRESS NOTE  [name], NP-C [place] [address]  DIAGNOSIS: Stage IV (T3, N1, M1a) non-small cell lung cancer, adenocarcinoma diagnosed in July 2018 and presented with a right hilar lymphadenopathy and malignant right pleural effusion diagnosed in July 2018. Molecular studies were negative for EGFR, ALK, ROS 1 and BRAF mutations. PDL 1 expression was 50%  PRIOR THERAPY: None  CURRENT THERAPY: Ketruda (pembrolizumab) 200 mg IV every 3 weeks, status post 16 cycles.  INTERVAL HISTORY: Bradley Maldonado 67 y.o. male returns to the clinic today for follow-up visit accompanied by his sister.  The patient is feeling fine today with no specific complaints except for shortness of breath with exertion.  He also has mild cough with one episode of blood-tinged sputum.  He denied having any chest pain or frank hemoptysis.  He denied having any nausea, vomiting, diarrhea or constipation.  He has no fever or chills.  He continues to tolerate his treatment with Keytruda fairly well.  The patient is here today for evaluation before starting cycle #17.  MEDICAL HISTORY: Past Medical History:  Diagnosis Date  . Adenocarcinoma of right lung, stage 4 ([place]) 01/24/2017  . Goals of care, counseling/discussion 01/24/2017  . Hypertension     ALLERGIES:  has No Known Allergies.  MEDICATIONS:  Current Outpatient Medications  Medication Sig Dispense Refill  . azithromycin (ZITHROMAX) 250 MG tablet Take 2 tablets on day 1, then 1 tablet on days 2-5. 6 tablet 0  . Omega-3 Fatty Acids (FISH OIL) 1200 MG CAPS Take 1,200 mg by mouth every other day.     No current facility-administered medications for this visit.     SURGICAL HISTORY:  Past Surgical History:  Procedure Laterality Date  . CHEST TUBE INSERTION Right 02/06/2017   Procedure: INSERTION PLEURAL DRAINAGE CATHETER;  Surgeon: Ivin Poot, MD;  Location: [facility];  Service: Thoracic;  Laterality: Right;  . EYE SURGERY Right    metal removed from eye  . Hip replacement    . IR THORACENTESIS ASP PLEURAL SPACE W/IMG GUIDE  12/19/2016  . REMOVAL OF PLEURAL DRAINAGE CATHETER Right 03/21/2017   Procedure: REMOVAL OF RIGHT PLEURAL DRAINAGE CATHETER;  Surgeon: Ivin Poot, MD;  Location: [facility];  Service: Thoracic;  Laterality: Right;    REVIEW OF SYSTEMS:  A comprehensive review of systems was negative except for: Respiratory: positive for cough and dyspnea on exertion   PHYSICAL EXAMINATION: General appearance: alert, cooperative and no distress Head: Normocephalic, without obvious abnormality, atraumatic Neck: no adenopathy, no JVD, supple, symmetrical, trachea midline and thyroid not enlarged, symmetric, no tenderness/mass/nodules Lymph nodes: Cervical, supraclavicular, and axillary nodes normal. Resp: clear to auscultation bilaterally Back: symmetric, no curvature. ROM normal. No CVA tenderness. Cardio: regular rate and rhythm, S1, S2 normal, no murmur, click, rub or gallop GI: soft, non-tender; bowel sounds normal; no masses,  no organomegaly Extremities: extremities normal, atraumatic, no cyanosis or edema  ECOG PERFORMANCE STATUS: 1 - Symptomatic but completely ambulatory  Blood pressure (!) 157/82, pulse (!) 102, temperature 97.7 F (36.5 C), temperature source Oral, resp. rate (!) 21, height 5' 9.5" (1.765 m), weight 153 lb (69.4 kg), SpO2 92 %.  LABORATORY DATA: Lab Results  Component Value Date   WBC 4.9 02/22/2018   HGB 14.7 02/22/2018   HCT 45.5 02/22/2018   MCV 86.0 02/22/2018   PLT 177 02/22/2018  Chemistry      Component Value Date/Time   NA 141 02/01/2018 1104   NA 138 05/04/2017 1011   K 4.5 02/01/2018 1104   K 3.9 05/04/2017 1011   CL 101 02/01/2018 1104   CO2 31 02/01/2018 1104   CO2 24 05/04/2017 1011   BUN 9 02/01/2018 1104   BUN 18.4 05/04/2017 1011   CREATININE 0.85 02/01/2018  1104   CREATININE 1.0 05/04/2017 1011      Component Value Date/Time   CALCIUM 9.4 02/01/2018 1104   CALCIUM 9.3 05/04/2017 1011   ALKPHOS 83 02/01/2018 1104   ALKPHOS 91 05/04/2017 1011   AST 20 02/01/2018 1104   AST 23 05/04/2017 1011   ALT 14 02/01/2018 1104   ALT 19 05/04/2017 1011   BILITOT 0.4 02/01/2018 1104   BILITOT 0.39 05/04/2017 1011       RADIOGRAPHIC STUDIES: Ct Chest W Contrast  Result Date: 01/31/2018 CLINICAL DATA:  Stage IV right lung adenocarcinoma diagnosed July 2018 presents for restaging with ongoing immunotherapy. EXAM: CT CHEST, ABDOMEN, AND PELVIS WITH CONTRAST TECHNIQUE: Multidetector CT imaging of the chest, abdomen and pelvis was performed following the standard protocol during bolus administration of intravenous contrast. CONTRAST:  161m OMNIPAQUE IOHEXOL 300 MG/ML  SOLN COMPARISON:  11/28/2017 CT chest, abdomen and pelvis. FINDINGS: CT CHEST FINDINGS Cardiovascular: Normal heart size. No significant pericardial effusion/thickening. Three-vessel coronary atherosclerosis. Atherosclerotic nonaneurysmal thoracic aorta. Normal caliber pulmonary arteries. No central pulmonary emboli. Mediastinum/Nodes: No discrete thyroid nodules. Unremarkable esophagus. Mild right axillary adenopathy measuring up to 1.1 cm (series 2/image 33), previously 1.1 cm, stable. No left axillary adenopathy. Enlarged 1.0 cm right pericardiophrenic node (series 2/image 55), stable using similar measurement technique. Mildly enlarged 1.1 cm AP window node (series 2/image 29), previously 1.0 cm using similar measurement technique, not appreciably changed. Stable enlarged 1.8 cm subcarinal node (series 2/image 35). Stable enlarged 1.1 cm right hilar node (series 2/image 31). No pathologically enlarged left hilar nodes. Lungs/Pleura: No pneumothorax. Prominent volume loss in the right hemithorax, stable. Circumferential irregular prominent right pleural thickening and hyperenhancement throughout the  right pleural space with loculated small dependent and subpulmonic right pleural effusion, not appreciably changed. Severe centrilobular emphysema with mild diffuse bronchial wall thickening. Interlobular septal thickening throughout the right lung, unchanged. Consolidation throughout much of the right middle and right lower lobes, suspect a combination of thick tumor and atelectasis, not appreciably changed. Numerous (greater than 10) pulmonary nodules scattered throughout the left lung, stable to mildly increased in size. For example a medial basilar left lower lobe 8 mm nodule (series 7/image 135), increased from 6 mm. Left lower lobe 7 mm nodule (series 7/image 125), minimally increased from 6 mm. Lingular 7 mm nodule (series 7/image 99), minimally increased from 6 mm. Posterior left upper lobe 7 mm nodule (series 7/image 67), stable using similar measurement technique. No new significant pulmonary nodules. Musculoskeletal: No aggressive appearing focal osseous lesions. Mild thoracic spondylosis. CT ABDOMEN PELVIS FINDINGS Hepatobiliary: Normal liver size. No liver mass. Normal gallbladder with no radiopaque cholelithiasis. No biliary ductal dilatation. Pancreas: Normal, with no mass or duct dilation. Spleen: Normal size. No mass. Adrenals/Urinary Tract: Stable 2.1 cm right adrenal (series 2/image 67) and 1.2 cm left adrenal (series 2/image 63) nodules. No hydronephrosis. No renal masses. Bladder obscured by streak artifact from right hip hardware with no gross bladder abnormality. Stomach/Bowel: Normal non-distended stomach. Normal caliber small bowel with no small bowel wall thickening. Normal appendix. Normal large bowel with no diverticulosis, large bowel  wall thickening or pericolonic fat stranding. Vascular/Lymphatic: Atherosclerotic nonaneurysmal abdominal aorta. Patent portal, splenic, hepatic and renal veins. Aortocaval adenopathy measures up to 1.7 cm (series 2/image 80), previously 1.6 cm, not  appreciably changed. Enlarged bulky 4.4 cm right common iliac node (series 2/image 89), mildly increased from 4.1 cm using similar measurement technique. Right external iliac adenopathy measuring up to 2.8 cm (series 2/image 110), increased from 2.6 cm. No left pelvic adenopathy. Reproductive: Top-normal size prostate. Other: No pneumoperitoneum, ascites or focal fluid collection. Musculoskeletal: No aggressive appearing focal osseous lesions. Right total hip arthroplasty is partially visualized. Advanced left hip osteoarthritis. Marked lumbar spondylosis. IMPRESSION: 1. Overall, findings are stable to mildly progressed. 2. Extensive circumferential tumor throughout the right pleural space is stable. Loculated small dependent/subpulmonic right pleural effusion is stable. Lymphangitic tumor and consolidative tumor/atelectasis throughout the right lung is stable. 3. Numerous contralateral pulmonary metastases are stable to mildly increased in size. 4. Right axillary, mediastinal and right hilar adenopathy is stable. 5. Bilateral adrenal metastases are stable. 6. Bulky right pelvic adenopathy is mildly increased. Mild-to-moderate retroperitoneal adenopathy is stable. 7. Aortic Atherosclerosis (ICD10-I70.0) and Emphysema (ICD10-J43.9). Electronically Signed   By: Ilona Sorrel M.D.   On: 01/31/2018 17:04   Ct Abdomen Pelvis W Contrast  Result Date: 01/31/2018 CLINICAL DATA:  Stage IV right lung adenocarcinoma diagnosed July 2018 presents for restaging with ongoing immunotherapy. EXAM: CT CHEST, ABDOMEN, AND PELVIS WITH CONTRAST TECHNIQUE: Multidetector CT imaging of the chest, abdomen and pelvis was performed following the standard protocol during bolus administration of intravenous contrast. CONTRAST:  181m OMNIPAQUE IOHEXOL 300 MG/ML  SOLN COMPARISON:  11/28/2017 CT chest, abdomen and pelvis. FINDINGS: CT CHEST FINDINGS Cardiovascular: Normal heart size. No significant pericardial effusion/thickening. Three-vessel  coronary atherosclerosis. Atherosclerotic nonaneurysmal thoracic aorta. Normal caliber pulmonary arteries. No central pulmonary emboli. Mediastinum/Nodes: No discrete thyroid nodules. Unremarkable esophagus. Mild right axillary adenopathy measuring up to 1.1 cm (series 2/image 33), previously 1.1 cm, stable. No left axillary adenopathy. Enlarged 1.0 cm right pericardiophrenic node (series 2/image 55), stable using similar measurement technique. Mildly enlarged 1.1 cm AP window node (series 2/image 29), previously 1.0 cm using similar measurement technique, not appreciably changed. Stable enlarged 1.8 cm subcarinal node (series 2/image 35). Stable enlarged 1.1 cm right hilar node (series 2/image 31). No pathologically enlarged left hilar nodes. Lungs/Pleura: No pneumothorax. Prominent volume loss in the right hemithorax, stable. Circumferential irregular prominent right pleural thickening and hyperenhancement throughout the right pleural space with loculated small dependent and subpulmonic right pleural effusion, not appreciably changed. Severe centrilobular emphysema with mild diffuse bronchial wall thickening. Interlobular septal thickening throughout the right lung, unchanged. Consolidation throughout much of the right middle and right lower lobes, suspect a combination of thick tumor and atelectasis, not appreciably changed. Numerous (greater than 10) pulmonary nodules scattered throughout the left lung, stable to mildly increased in size. For example a medial basilar left lower lobe 8 mm nodule (series 7/image 135), increased from 6 mm. Left lower lobe 7 mm nodule (series 7/image 125), minimally increased from 6 mm. Lingular 7 mm nodule (series 7/image 99), minimally increased from 6 mm. Posterior left upper lobe 7 mm nodule (series 7/image 67), stable using similar measurement technique. No new significant pulmonary nodules. Musculoskeletal: No aggressive appearing focal osseous lesions. Mild thoracic  spondylosis. CT ABDOMEN PELVIS FINDINGS Hepatobiliary: Normal liver size. No liver mass. Normal gallbladder with no radiopaque cholelithiasis. No biliary ductal dilatation. Pancreas: Normal, with no mass or duct dilation. Spleen: Normal size. No mass. Adrenals/Urinary  Tract: Stable 2.1 cm right adrenal (series 2/image 67) and 1.2 cm left adrenal (series 2/image 63) nodules. No hydronephrosis. No renal masses. Bladder obscured by streak artifact from right hip hardware with no gross bladder abnormality. Stomach/Bowel: Normal non-distended stomach. Normal caliber small bowel with no small bowel wall thickening. Normal appendix. Normal large bowel with no diverticulosis, large bowel wall thickening or pericolonic fat stranding. Vascular/Lymphatic: Atherosclerotic nonaneurysmal abdominal aorta. Patent portal, splenic, hepatic and renal veins. Aortocaval adenopathy measures up to 1.7 cm (series 2/image 80), previously 1.6 cm, not appreciably changed. Enlarged bulky 4.4 cm right common iliac node (series 2/image 89), mildly increased from 4.1 cm using similar measurement technique. Right external iliac adenopathy measuring up to 2.8 cm (series 2/image 110), increased from 2.6 cm. No left pelvic adenopathy. Reproductive: Top-normal size prostate. Other: No pneumoperitoneum, ascites or focal fluid collection. Musculoskeletal: No aggressive appearing focal osseous lesions. Right total hip arthroplasty is partially visualized. Advanced left hip osteoarthritis. Marked lumbar spondylosis. IMPRESSION: 1. Overall, findings are stable to mildly progressed. 2. Extensive circumferential tumor throughout the right pleural space is stable. Loculated small dependent/subpulmonic right pleural effusion is stable. Lymphangitic tumor and consolidative tumor/atelectasis throughout the right lung is stable. 3. Numerous contralateral pulmonary metastases are stable to mildly increased in size. 4. Right axillary, mediastinal and right hilar  adenopathy is stable. 5. Bilateral adrenal metastases are stable. 6. Bulky right pelvic adenopathy is mildly increased. Mild-to-moderate retroperitoneal adenopathy is stable. 7. Aortic Atherosclerosis (ICD10-I70.0) and Emphysema (ICD10-J43.9). Electronically Signed   By: Ilona Sorrel M.D.   On: 01/31/2018 17:04    ASSESSMENT AND PLAN: This is a very pleasant 67 years old African-American male with a stage IV non-small cell lung cancer, adenocarcinoma with PDL 1 expression of 50% and negative for actionable mutations. The patient is currently undergoing treatment with immunotherapy with Keytruda 200 mg IV every 3 weeks status post 16 cycles.  He continues to tolerate this treatment well with no concerning adverse effects. I recommended for the patient to proceed with cycle #17 today as scheduled. I will see the patient back for follow-up visit in 3 weeks for evaluation before starting cycle #18. He was advised to call immediately if he has any other concerning symptoms in the interval. The patient voices understanding of current disease status and treatment options and is in agreement with the current care plan. All questions were answered. The patient knows to call the clinic with any problems, questions or concerns. We can certainly see the patient much sooner if necessary.  Disclaimer: This note was dictated with voice recognition software. Similar sounding words can inadvertently be transcribed and may not be corrected upon review.

## 2018-02-22 NOTE — Telephone Encounter (Signed)
Appts scheduled AVS/Calendar printed per 10/3 los

## 2018-02-22 NOTE — Telephone Encounter (Signed)
Appts scheduled avs/calendar printed per 10/3 los

## 2018-02-22 NOTE — Patient Instructions (Signed)
[facility] Discharge Instructions for Patients Receiving Chemotherapy  Today you received the following chemotherapy agents Pembrolizumab Beryle Flock).  To help prevent nausea and vomiting after your treatment, we encourage you to take your nausea medication as prescribed.   If you develop nausea and vomiting that is not controlled by your nausea medication, call the clinic.   BELOW ARE SYMPTOMS THAT SHOULD BE REPORTED IMMEDIATELY:  *FEVER GREATER THAN 100.5 F  *CHILLS WITH OR WITHOUT FEVER  NAUSEA AND VOMITING THAT IS NOT CONTROLLED WITH YOUR NAUSEA MEDICATION  *UNUSUAL SHORTNESS OF BREATH  *UNUSUAL BRUISING OR BLEEDING  TENDERNESS IN MOUTH AND THROAT WITH OR WITHOUT PRESENCE OF ULCERS  *URINARY PROBLEMS  *BOWEL PROBLEMS  UNUSUAL RASH Items with * indicate a potential emergency and should be followed up as soon as possible.  Feel free to call the clinic should you have any questions or concerns. The clinic phone number is [phone].  Please show the [facility] at check-in to the Emergency Department and triage nurse.

## 2018-02-28 ENCOUNTER — Telehealth: Payer: Self-pay | Admitting: Family Medicine

## 2018-02-28 MED ORDER — AMLODIPINE BESYLATE 5 MG PO TABS: 5.0000 mg | ORAL_TABLET | Freq: Every day | ORAL | 0 refills | Status: DC

## 2018-02-28 NOTE — Telephone Encounter (Signed)
Pt states it was amlopidine 5mg  in which we discontinued when he was in since he was not on it. Pt has a follow-up appt November 6th

## 2018-02-28 NOTE — Telephone Encounter (Signed)
Please call and confirm either with the patient or with his pharmacy as to which medication he was on for HTN. Ok to refill this and have him return in 4 weeks. Thanks.

## 2018-02-28 NOTE — Telephone Encounter (Signed)
Pt states he went to [facility] and Dr Julien Nordmann said BP was elevated and would like pt be put back on BP medicine that he was on before he thinks was amlodipine 5 mg.  Please send to [facility] on [address].  Pt would like a call back to let him know if this is called in

## 2018-03-01 ENCOUNTER — Other Ambulatory Visit: Payer: Self-pay

## 2018-03-01 ENCOUNTER — Emergency Department (HOSPITAL_COMMUNITY): Payer: Medicare Other

## 2018-03-01 ENCOUNTER — Inpatient Hospital Stay (HOSPITAL_COMMUNITY)
Admission: EM | Admit: 2018-03-01 | Discharge: 2018-03-06 | DRG: 175 | Disposition: A | Discharge status: Home Health | Payer: Medicare Other | Attending: Internal Medicine | Admitting: Internal Medicine

## 2018-03-01 ENCOUNTER — Inpatient Hospital Stay (HOSPITAL_COMMUNITY): Payer: Medicare Other

## 2018-03-01 ENCOUNTER — Encounter (HOSPITAL_COMMUNITY): Payer: Self-pay | Admitting: Emergency Medicine

## 2018-03-01 DIAGNOSIS — Z79899 Other long term (current) drug therapy: Secondary | ICD-10-CM

## 2018-03-01 DIAGNOSIS — J90 Pleural effusion, not elsewhere classified: Secondary | ICD-10-CM | POA: Diagnosis present

## 2018-03-01 DIAGNOSIS — R0602 Shortness of breath: Secondary | ICD-10-CM | POA: Diagnosis not present

## 2018-03-01 DIAGNOSIS — C3491 Malignant neoplasm of unspecified part of right bronchus or lung: Secondary | ICD-10-CM | POA: Diagnosis present

## 2018-03-01 DIAGNOSIS — R042 Hemoptysis: Secondary | ICD-10-CM | POA: Diagnosis present

## 2018-03-01 DIAGNOSIS — Z5309 Procedure and treatment not carried out because of other contraindication: Secondary | ICD-10-CM | POA: Diagnosis present

## 2018-03-01 DIAGNOSIS — Z801 Family history of malignant neoplasm of trachea, bronchus and lung: Secondary | ICD-10-CM | POA: Diagnosis not present

## 2018-03-01 DIAGNOSIS — F1290 Cannabis use, unspecified, uncomplicated: Secondary | ICD-10-CM | POA: Diagnosis present

## 2018-03-01 DIAGNOSIS — J9601 Acute respiratory failure with hypoxia: Secondary | ICD-10-CM | POA: Diagnosis not present

## 2018-03-01 DIAGNOSIS — Z9981 Dependence on supplemental oxygen: Secondary | ICD-10-CM | POA: Diagnosis not present

## 2018-03-01 DIAGNOSIS — Z7189 Other specified counseling: Secondary | ICD-10-CM | POA: Diagnosis not present

## 2018-03-01 DIAGNOSIS — I428 Other cardiomyopathies: Secondary | ICD-10-CM | POA: Diagnosis not present

## 2018-03-01 DIAGNOSIS — C3411 Malignant neoplasm of upper lobe, right bronchus or lung: Secondary | ICD-10-CM

## 2018-03-01 DIAGNOSIS — F1490 Cocaine use, unspecified, uncomplicated: Secondary | ICD-10-CM | POA: Diagnosis present

## 2018-03-01 DIAGNOSIS — C7931 Secondary malignant neoplasm of brain: Secondary | ICD-10-CM | POA: Diagnosis present

## 2018-03-01 DIAGNOSIS — C7989 Secondary malignant neoplasm of other specified sites: Secondary | ICD-10-CM

## 2018-03-01 DIAGNOSIS — I5043 Acute on chronic combined systolic (congestive) and diastolic (congestive) heart failure: Secondary | ICD-10-CM | POA: Diagnosis present

## 2018-03-01 DIAGNOSIS — Z96641 Presence of right artificial hip joint: Secondary | ICD-10-CM | POA: Diagnosis present

## 2018-03-01 DIAGNOSIS — J9811 Atelectasis: Secondary | ICD-10-CM | POA: Diagnosis present

## 2018-03-01 DIAGNOSIS — J449 Chronic obstructive pulmonary disease, unspecified: Secondary | ICD-10-CM | POA: Diagnosis present

## 2018-03-01 DIAGNOSIS — I429 Cardiomyopathy, unspecified: Secondary | ICD-10-CM | POA: Diagnosis present

## 2018-03-01 DIAGNOSIS — I110 Hypertensive heart disease with heart failure: Secondary | ICD-10-CM | POA: Diagnosis present

## 2018-03-01 DIAGNOSIS — I514 Myocarditis, unspecified: Secondary | ICD-10-CM | POA: Diagnosis present

## 2018-03-01 DIAGNOSIS — C3490 Malignant neoplasm of unspecified part of unspecified bronchus or lung: Secondary | ICD-10-CM | POA: Diagnosis present

## 2018-03-01 DIAGNOSIS — G936 Cerebral edema: Secondary | ICD-10-CM | POA: Diagnosis present

## 2018-03-01 DIAGNOSIS — I2699 Other pulmonary embolism without acute cor pulmonale: Principal | ICD-10-CM | POA: Diagnosis present

## 2018-03-01 DIAGNOSIS — J910 Malignant pleural effusion: Secondary | ICD-10-CM | POA: Diagnosis present

## 2018-03-01 DIAGNOSIS — Z87891 Personal history of nicotine dependence: Secondary | ICD-10-CM | POA: Diagnosis not present

## 2018-03-01 DIAGNOSIS — H0520 Unspecified exophthalmos: Secondary | ICD-10-CM | POA: Diagnosis present

## 2018-03-01 DIAGNOSIS — I5041 Acute combined systolic (congestive) and diastolic (congestive) heart failure: Secondary | ICD-10-CM

## 2018-03-01 DIAGNOSIS — Z9221 Personal history of antineoplastic chemotherapy: Secondary | ICD-10-CM

## 2018-03-01 DIAGNOSIS — Z515 Encounter for palliative care: Secondary | ICD-10-CM | POA: Diagnosis not present

## 2018-03-01 DIAGNOSIS — I10 Essential (primary) hypertension: Secondary | ICD-10-CM | POA: Diagnosis present

## 2018-03-01 DIAGNOSIS — J9621 Acute and chronic respiratory failure with hypoxia: Secondary | ICD-10-CM | POA: Diagnosis present

## 2018-03-01 DIAGNOSIS — G969 Disorder of central nervous system, unspecified: Secondary | ICD-10-CM

## 2018-03-01 LAB — BASIC METABOLIC PANEL
ANION GAP: 10 (ref 5–15)
BUN: 6 mg/dL — ABNORMAL LOW (ref 8–23)
CHLORIDE: 103 mmol/L (ref 98–111)
CO2: 26 mmol/L (ref 22–32)
Calcium: 8.9 mg/dL (ref 8.9–10.3)
Creatinine, Ser: 0.71 mg/dL (ref 0.61–1.24)
Glucose, Bld: 116 mg/dL — ABNORMAL HIGH (ref 70–99)
POTASSIUM: 3.9 mmol/L (ref 3.5–5.1)
Sodium: 139 mmol/L (ref 135–145)

## 2018-03-01 LAB — CBC
HEMATOCRIT: 50.8 % (ref 39.0–52.0)
HEMOGLOBIN: 15.6 g/dL (ref 13.0–17.0)
MCH: 27.2 pg (ref 26.0–34.0)
MCHC: 30.7 g/dL (ref 30.0–36.0)
MCV: 88.7 fL (ref 80.0–100.0)
Platelets: 184 10*3/uL (ref 150–400)
RBC: 5.73 MIL/uL (ref 4.22–5.81)
RDW: 16.2 % — ABNORMAL HIGH (ref 11.5–15.5)
WBC: 6.5 10*3/uL (ref 4.0–10.5)
nRBC: 0 % (ref 0.0–0.2)

## 2018-03-01 LAB — PROTIME-INR
INR: 1.16
PROTHROMBIN TIME: 14.8 s (ref 11.4–15.2)

## 2018-03-01 LAB — I-STAT TROPONIN, ED: Troponin i, poc: 0 ng/mL (ref 0.00–0.08)

## 2018-03-01 LAB — BRAIN NATRIURETIC PEPTIDE: B Natriuretic Peptide: 33.9 pg/mL (ref 0.0–100.0)

## 2018-03-01 MED ORDER — IOPAMIDOL (ISOVUE-370) INJECTION 76%
INTRAVENOUS | Status: AC
  Filled 2018-03-01: qty 100

## 2018-03-01 MED ORDER — ZOLPIDEM TARTRATE 5 MG PO TABS: 5.0000 mg | ORAL_TABLET | Freq: Every evening | ORAL | Status: DC | PRN

## 2018-03-01 MED ORDER — AZITHROMYCIN 250 MG PO TABS
500.0000 mg | ORAL_TABLET | Freq: Every day | ORAL | Status: DC
  Administered 2018-03-01 – 2018-03-06 (×6): 500 mg via ORAL
  Filled 2018-03-01 (×7): qty 2

## 2018-03-01 MED ORDER — IPRATROPIUM BROMIDE 0.02 % IN SOLN
0.5000 mg | Freq: Four times a day (QID) | RESPIRATORY_TRACT | Status: DC
  Administered 2018-03-01 (×2): 0.5 mg via RESPIRATORY_TRACT
  Filled 2018-03-01 (×2): qty 2.5

## 2018-03-01 MED ORDER — ACETAMINOPHEN 325 MG PO TABS: 650.0000 mg | ORAL_TABLET | Freq: Four times a day (QID) | ORAL | Status: DC | PRN

## 2018-03-01 MED ORDER — SODIUM CHLORIDE 0.9 % IV SOLN: 250.0000 mL | INTRAVENOUS | Status: DC | PRN

## 2018-03-01 MED ORDER — IPRATROPIUM BROMIDE 0.02 % IN SOLN
0.5000 mg | Freq: Three times a day (TID) | RESPIRATORY_TRACT | Status: DC
  Administered 2018-03-02: 0.5 mg via RESPIRATORY_TRACT
  Filled 2018-03-01: qty 2.5

## 2018-03-01 MED ORDER — HYDROMORPHONE HCL 1 MG/ML IJ SOLN: 0.5000 mg | INTRAMUSCULAR | Status: DC | PRN

## 2018-03-01 MED ORDER — SODIUM CHLORIDE 0.9% FLUSH
3.0000 mL | Freq: Two times a day (BID) | INTRAVENOUS | Status: DC
  Administered 2018-03-01 – 2018-03-06 (×8): 3 mL via INTRAVENOUS

## 2018-03-01 MED ORDER — LIDOCAINE HCL 1 % IJ SOLN
INTRAMUSCULAR | Status: AC
  Filled 2018-03-01: qty 20

## 2018-03-01 MED ORDER — ALBUTEROL SULFATE (2.5 MG/3ML) 0.083% IN NEBU
2.5000 mg | INHALATION_SOLUTION | Freq: Three times a day (TID) | RESPIRATORY_TRACT | Status: DC
  Administered 2018-03-02: 2.5 mg via RESPIRATORY_TRACT
  Filled 2018-03-01: qty 3

## 2018-03-01 MED ORDER — METHYLPREDNISOLONE SODIUM SUCC 125 MG IJ SOLR
125.0000 mg | Freq: Once | INTRAMUSCULAR | Status: AC
  Administered 2018-03-01: 125 mg via INTRAVENOUS
  Filled 2018-03-01: qty 2

## 2018-03-01 MED ORDER — ALBUTEROL SULFATE (2.5 MG/3ML) 0.083% IN NEBU
5.0000 mg | INHALATION_SOLUTION | Freq: Once | RESPIRATORY_TRACT | Status: AC
  Administered 2018-03-01: 5 mg via RESPIRATORY_TRACT
  Filled 2018-03-01: qty 6

## 2018-03-01 MED ORDER — AMLODIPINE BESYLATE 5 MG PO TABS
5.0000 mg | ORAL_TABLET | Freq: Every day | ORAL | Status: DC
  Administered 2018-03-01 – 2018-03-04 (×4): 5 mg via ORAL
  Filled 2018-03-01 (×4): qty 1

## 2018-03-01 MED ORDER — ALBUTEROL SULFATE (2.5 MG/3ML) 0.083% IN NEBU
2.5000 mg | INHALATION_SOLUTION | Freq: Four times a day (QID) | RESPIRATORY_TRACT | Status: DC
  Administered 2018-03-01 (×2): 2.5 mg via RESPIRATORY_TRACT
  Filled 2018-03-01 (×2): qty 3

## 2018-03-01 MED ORDER — ACETAMINOPHEN 650 MG RE SUPP: 650.0000 mg | Freq: Four times a day (QID) | RECTAL | Status: DC | PRN

## 2018-03-01 MED ORDER — HYDROCODONE-ACETAMINOPHEN 5-325 MG PO TABS
1.0000 | ORAL_TABLET | ORAL | Status: DC | PRN
  Administered 2018-03-02: 2 via ORAL
  Administered 2018-03-05 – 2018-03-06 (×2): 1 via ORAL
  Filled 2018-03-01: qty 2
  Filled 2018-03-01 (×2): qty 1

## 2018-03-01 MED ORDER — ONDANSETRON HCL 4 MG PO TABS: 4.0000 mg | ORAL_TABLET | Freq: Four times a day (QID) | ORAL | Status: DC | PRN

## 2018-03-01 MED ORDER — ALBUTEROL SULFATE (2.5 MG/3ML) 0.083% IN NEBU
2.5000 mg | INHALATION_SOLUTION | RESPIRATORY_TRACT | Status: DC | PRN
  Administered 2018-03-06: 2.5 mg via RESPIRATORY_TRACT
  Filled 2018-03-01: qty 3

## 2018-03-01 MED ORDER — IPRATROPIUM-ALBUTEROL 0.5-2.5 (3) MG/3ML IN SOLN
3.0000 mL | Freq: Once | RESPIRATORY_TRACT | Status: AC
  Administered 2018-03-01: 3 mL via RESPIRATORY_TRACT
  Filled 2018-03-01: qty 3

## 2018-03-01 MED ORDER — HEPARIN SODIUM (PORCINE) 5000 UNIT/ML IJ SOLN
5000.0000 [IU] | Freq: Three times a day (TID) | INTRAMUSCULAR | Status: DC
  Administered 2018-03-01 – 2018-03-02 (×2): 5000 [IU] via SUBCUTANEOUS
  Filled 2018-03-01 (×3): qty 1

## 2018-03-01 MED ORDER — SODIUM CHLORIDE 0.9% FLUSH: 3.0000 mL | INTRAVENOUS | Status: DC | PRN

## 2018-03-01 MED ORDER — ONDANSETRON HCL 4 MG/2ML IJ SOLN: 4.0000 mg | Freq: Four times a day (QID) | INTRAMUSCULAR | Status: DC | PRN

## 2018-03-01 NOTE — Progress Notes (Signed)
Patient ID: Bradley Maldonado, male   DOB: May 01, 1951, 67 y.o.   MRN: [id number] Pt presented to radiology dept today for right thoracentesis; on limited US rt post chest there is no sig free pleural fluid available to tap; there is sig tumor burden along with atelectatic lung . Images were reviewed by Dr. Laurence Ferrari. Procedure cancelled. Pt informed.

## 2018-03-01 NOTE — ED Provider Notes (Signed)
[facility] EMERGENCY DEPARTMENT Provider Note   CSN: [id number] Arrival date & time: 03/01/18  4098     History   Chief Complaint Chief Complaint  Patient presents with  . Shortness of Breath  . Lung Cancer    HPI Bradley Maldonado is a 67 y.o. male.  HPI   67 year old male with PMH significant for stage IV NSCLC of the right lung on Keytruda, history of malignant pleural effusions of the right, COPD who presents with chief complaint of first breath.  Patient states that 7 days prior to arrival he had gradual onset of shortness of breath and dyspnea on exertion.  Patient endorses significant shortness of breath at rest.  Denies chest pain.  Endorses hemoptysis, small volume, denies lower extremity swelling.  Denies history of PE or DVT.  Denies recent fever, congestion runny nose, endorses cough productive of increased amounts of sputum with changing of color.  Denies recent traumas or falls.  Past Medical History:  Diagnosis Date  . Adenocarcinoma of right lung, stage 4 ([place]) 01/24/2017  . Goals of care, counseling/discussion 01/24/2017  . Hypertension     Patient Active Problem List   Diagnosis Date Noted  . Encounter for antineoplastic immunotherapy 02/15/2017  . Adenocarcinoma of right lung, stage 4 ([place]) 01/24/2017  . Goals of care, counseling/discussion 01/24/2017  . Status post thoracentesis   . Chronic obstructive pulmonary disease ([place])   . Lung cancer ([place]) 12/20/2016  . Pleural effusion, malignant 12/17/2016  . Hypertension 12/17/2016  . History of right hip replacement 12/17/2016    Past Surgical History:  Procedure Laterality Date  . CHEST TUBE INSERTION Right 02/06/2017   Procedure: INSERTION PLEURAL DRAINAGE CATHETER;  Surgeon: Ivin Poot, MD;  Location: [facility];  Service: Thoracic;  Laterality: Right;  . EYE SURGERY Right    metal removed from eye  . Hip replacement    . IR THORACENTESIS ASP PLEURAL SPACE W/IMG GUIDE  12/19/2016  .  REMOVAL OF PLEURAL DRAINAGE CATHETER Right 03/21/2017   Procedure: REMOVAL OF RIGHT PLEURAL DRAINAGE CATHETER;  Surgeon: Ivin Poot, MD;  Location: [facility];  Service: Thoracic;  Laterality: Right;        Home Medications    Prior to Admission medications   Medication Sig Start Date End Date Taking? Authorizing Provider  amLODipine (NORVASC) 5 MG tablet Take 1 tablet (5 mg total) by mouth daily. 02/28/18  Yes Henson, Vickie L, NP-C  azithromycin (ZITHROMAX) 250 MG tablet Take 2 tablets on day 1, then 1 tablet on days 2-5. Patient not taking: Reported on 03/01/2018 02/08/18   [name], NP-C    Family History Family History  Problem Relation Age of Onset  . Diabetes Mother   . Lung cancer Father   . Cancer Father   . Cancer - Lung Father     Social History Social History   Tobacco Use  . Smoking status: Former Smoker    Last attempt to quit: 01/21/2018    Years since quitting: 0.1  . Smokeless tobacco: Never Used  Substance Use Topics  . Alcohol use: Yes    Alcohol/week: 14.0 standard drinks    Types: 14 Cans of beer per week    Comment: occasionally  now since cancer diagnosis  . Drug use: Yes    Types: Marijuana, Cocaine    Comment: using cocaine 1-2 times per week     Allergies   Patient has no known allergies.   Review of Systems  Review of Systems  Constitutional: Positive for fatigue. Negative for chills and fever.  HENT: Negative for ear pain and sore throat.   Eyes: Negative for pain and visual disturbance.  Respiratory: Positive for cough and shortness of breath.   Cardiovascular: Negative for chest pain, palpitations and leg swelling.  Gastrointestinal: Negative for abdominal pain and vomiting.  Genitourinary: Negative for dysuria and hematuria.  Musculoskeletal: Negative for arthralgias and back pain.  Skin: Negative for color change and rash.  Neurological: Negative for seizures and syncope.  All other systems reviewed and are  negative.    Physical Exam Updated Vital Signs BP 138/83   Pulse (!) 119   Temp 97.8 F (36.6 C) (Oral)   Resp (!) 29   Ht 5' 9.5" (1.765 m)   Wt 69 kg   SpO2 91%   BMI 22.14 kg/m   Physical Exam  Constitutional: He appears well-developed and well-nourished.  Non-toxic appearance. He appears ill. He appears distressed.  HENT:  Head: Normocephalic and atraumatic.  Eyes: Pupils are equal, round, and reactive to light. Conjunctivae and EOM are normal.  Neck: Neck supple.  Cardiovascular: Normal rate and regular rhythm.  No murmur heard. Pulmonary/Chest: Accessory muscle usage present. No stridor. Tachypnea noted. No respiratory distress. He has decreased breath sounds in the right lower field and the left lower field. He has rhonchi in the right upper field, the right middle field, the right lower field, the left upper field, the left middle field and the left lower field. He has rales in the right upper field, the right middle field, the right lower field, the left upper field, the left middle field and the left lower field.  Abdominal: Soft. He exhibits no distension and no mass. There is no tenderness. There is no rebound and no guarding.  Musculoskeletal: He exhibits no edema.       Right lower leg: Normal. He exhibits no edema.       Left lower leg: Normal. He exhibits no edema.  Neurological: [name] alert.  Skin: Skin is warm and dry. Capillary refill takes less than 2 seconds. No cyanosis.  Psychiatric: He has a normal mood and affect.  Nursing note and vitals reviewed.    ED Treatments / Results  Labs (all labs ordered are listed, but only abnormal results are displayed) Labs Reviewed  BASIC METABOLIC PANEL - Abnormal; Notable for the following components:      Result Value   Glucose, Bld 116 (*)    BUN 6 (*)    All other components within normal limits  CBC - Abnormal; Notable for the following components:   RDW 16.2 (*)    All other components within normal  limits  BRAIN NATRIURETIC PEPTIDE  I-STAT TROPONIN, ED    EKG EKG Interpretation  Date/Time:  Thursday March 01 2018 06:53:57 EDT Ventricular Rate:  119 PR Interval:    QRS Duration: 92 QT Interval:  326 QTC Calculation: 459 R Axis:   69 Text Interpretation:  Sinus tachycardia Ventricular premature complex Probable left atrial enlargement Minimal ST depression Baseline wander in lead(s) I III aVL No significant change since last tracing Confirmed by Wandra Arthurs [phone]) on 03/01/2018 9:46:27 AM   Radiology Dg Chest Portable 1 View  Result Date: 03/01/2018 CLINICAL DATA:  Worsening shortness of breath for 2 days, history lung cancer, hypertension EXAM: PORTABLE CHEST 1 VIEW COMPARISON:  Portable exam 0707 hours compared to CT chest of 01/31/2018 FINDINGS: Subtotal opacification of the RIGHT hemithorax  by combination of pleural effusion and tumor as well as RIGHT lung atelectasis. Mediastinal shift to RIGHT. Normal heart size and pulmonary vascularity. Scattered chronic accentuation of interstitial markings in the LEFT lung. The discrete pulmonary metastases seen in the LEFT lung on the prior CT exam are less well delineated radiographically. No segmental infiltrate or pleural effusion in the LEFT lung. No pneumothorax. Bones demineralized. IMPRESSION: Chronic subtotal opacification of RIGHT hemithorax by known tumor, effusion, and atelectasis. Chronic accentuation of interstitial markings in LEFT lung without superimposed acute infiltrate. Poorly visualized LEFT lung metastases as noted on prior CT. Electronically Signed   By: Lavonia Dana M.D.   On: 03/01/2018 07:41    Procedures Procedures (including critical care time)  Medications Ordered in ED Medications  iopamidol (ISOVUE-370) 76 % injection (has no administration in time range)  albuterol (PROVENTIL) (2.5 MG/3ML) 0.083% nebulizer solution 5 mg (5 mg Nebulization Given 03/01/18 0737)  ipratropium-albuterol (DUONEB) 0.5-2.5 (3)  MG/3ML nebulizer solution 3 mL (3 mLs Nebulization Given 03/01/18 0737)  methylPREDNISolone sodium succinate (SOLU-MEDROL) 125 mg/2 mL injection 125 mg (125 mg Intravenous Given 03/01/18 0737)     Initial Impression / Assessment and Plan / ED Course  I have reviewed the triage vital signs and the nursing notes.  Pertinent labs & imaging results that were available during my care of the patient were reviewed by me and considered in my medical decision making (see chart for details).     67 year old male with PMH significant for stage IV NSCLC of the right lung on Keytruda, history of malignant pleural effusions of the right, COPD who presents with chief complaint of first breath.  She is above.  DDX includes malignant pleural effusion, pneumonia, COPD exacerbation, pulmonary embolism, progression of lung cancer.  Patient is hypoxic with new 5 L oxygen requirement, tachycardic to 110s, normotensive, tachypneic.  Labs and Imaging obtained.  Reveal no significant abnormality.  Chest x-ray reveals large right-sided pleural effusion.  Patient unable to tolerate laying flat for his CT scan.  CT surgery consulted for evaluation for Pleurx catheter/thoracentesis.  Currently in OR, will attempt to consult interventional radiology for intervention.  Initial radiology consulted, patient scheduled for IR therapeutic right-sided thoracentesis.  Patient to be admitted to hospitalist service postthoracentesis with CT surgery following for negative management.  Patient's oncologist, Dr. Julien Nordmann, was consulted who agrees with plan and disposition.  Agrees to follow-up patient once discharged.  Patient condition with my attending Dr. Darl Householder who agrees with plan and disposition.  Final Clinical Impressions(s) / ED Diagnoses   Final diagnoses:  Pleural effusion    ED Discharge Orders    None       Keenan Bachelor, MD 03/01/18 1239    Drenda Freeze, MD 03/03/18 [phone]

## 2018-03-01 NOTE — ED Notes (Signed)
2nd attempt to call report, RN to call back once on the floor.

## 2018-03-01 NOTE — Consult Note (Addendum)
Subjective:  Chief complaint: Shortness of breath  History of the present illness: Patient is a 67 year old male known to T CTS due to history of stage IV adenocarcinoma of the right lung.  He was seen last year and had a Pleurx catheter was placed by Dr. Darcey Nora.  It subsequently was removed when the drainage ceased.  Most recently he has been under the care of Dr. Earlie Server receiving [facility] chemotherapy.  He has had 17 cycles of therapy.  He also has a known history of COPD. He quit smoking one month ago.  Over the past approximately 1 week he has developed progressive increase in shortness of breath.  He has also had small-volume hemoptysis. He has orthopnea and PND. He has had some wheezing.   He presented to the emergency department today and a chest x-ray revealed significant opacification of the right lung fields to include atelectasis, effusion and tumor bulk.  We are asked to see the patient in consult for assistance with management.    Patient Active Problem List   Diagnosis Date Noted  . Encounter for antineoplastic immunotherapy 02/15/2017  . Adenocarcinoma of right lung, stage 4 ([place]) 01/24/2017  . Goals of care, counseling/discussion 01/24/2017  . Status post thoracentesis   . Chronic obstructive pulmonary disease ([place])   . Lung cancer ([place]) 12/20/2016  . Pleural effusion, malignant 12/17/2016  . Hypertension 12/17/2016  . History of right hip replacement 12/17/2016   Past Medical History:  Diagnosis Date  . Adenocarcinoma of right lung, stage 4 ([place]) 01/24/2017  . Goals of care, counseling/discussion 01/24/2017  . Hypertension     Past Surgical History:  Procedure Laterality Date  . CHEST TUBE INSERTION Right 02/06/2017   Procedure: INSERTION PLEURAL DRAINAGE CATHETER;  Surgeon: Ivin Poot, MD;  Location: [facility];  Service: Thoracic;  Laterality: Right;  . EYE SURGERY Right    metal removed from eye  . Hip replacement    . IR THORACENTESIS ASP PLEURAL SPACE W/IMG GUIDE   12/19/2016  . REMOVAL OF PLEURAL DRAINAGE CATHETER Right 03/21/2017   Procedure: REMOVAL OF RIGHT PLEURAL DRAINAGE CATHETER;  Surgeon: Ivin Poot, MD;  Location: [facility];  Service: Thoracic;  Laterality: Right;    Current Meds  Medication Sig  . amLODipine (NORVASC) 5 MG tablet Take 1 tablet (5 mg total) by mouth daily.     (Not in a hospital admission) No Known Allergies   Social History   Tobacco Use  . Smoking status: Former Smoker    Last attempt to quit: 01/21/2018    Years since quitting: 0.1  . Smokeless tobacco: Never Used  Substance Use Topics  . Alcohol use: Yes    Alcohol/week: 14.0 standard drinks    Types: 14 Cans of beer per week    Comment: occasionally  now since cancer diagnosis    Family History  Problem Relation Age of Onset  . Diabetes Mother   . Lung cancer Father   . Cancer Father   . Cancer - Lung Father     Review of Systems   Review of Systems  Constitutional: Positive for malaise/fatigue and weight loss. Negative for chills, diaphoresis and fever.  HENT: Negative for congestion, ear discharge, ear pain, hearing loss, nosebleeds, sinus pain, sore throat and tinnitus.   Eyes: Negative.   Respiratory: Positive for cough, hemoptysis, sputum production, shortness of breath and wheezing. Negative for stridor.   Cardiovascular: Positive for palpitations, orthopnea and PND. Negative for chest pain, claudication and leg swelling.  Gastrointestinal: Negative for abdominal pain, blood in stool, constipation, diarrhea, heartburn, melena, nausea and vomiting.  Genitourinary: Negative.   Musculoskeletal: Negative.   Skin: Negative for itching and rash.  Neurological: Positive for weakness. Negative for dizziness, tingling, tremors, sensory change, speech change, focal weakness, seizures, loss of consciousness and headaches.  Endo/Heme/Allergies: Negative for environmental allergies and polydipsia. Does not bruise/bleed easily.  Psychiatric/Behavioral:  Positive for substance abuse. Negative for depression, hallucinations, memory loss and suicidal ideas. The patient is nervous/anxious. The patient does not have insomnia.     Objective:   Patient Vitals for the past 8 hrs:  BP Temp Temp src Pulse Resp SpO2 Height Weight  03/01/18 1000 138/83 - - (!) 119 (!) 29 91 % - -  03/01/18 0915 (!) 149/87 - - (!) 125 (!) 32 (!) 87 % - -  03/01/18 0800 112/83 - - (!) 112 (!) 24 93 % - -  03/01/18 0730 135/83 - - (!) 109 (!) 24 94 % - -  03/01/18 0657 (!) 147/94 97.8 F (36.6 C) Oral (!) 120 (!) 27 92 % 5' 9.5" (1.765 m) 69 kg  03/01/18 0651 - - - - - (!) 89 % - -   Physical Exam  Constitutional: [name] oriented to person, place, and time. He appears well-developed. He appears distressed.  Appears chronically ill  HENT:  Head: Normocephalic and atraumatic.  Mouth/Throat: Oropharynx is clear and moist. No oropharyngeal exudate.  Eyes: Pupils are equal, round, and reactive to light. EOM are normal. Right eye exhibits no discharge. Left eye exhibits no discharge. No scleral icterus.  Conjunctiva is muddy  Neck: Neck supple. No JVD present. No tracheal deviation present. No thyromegaly present.  Cardiovascular: Regular rhythm and normal heart sounds. Exam reveals no gallop and no friction rub.  No murmur heard. Weak pedal pulses   Pulmonary/Chest: No stridor. He has no wheezes. He has no rales.  Dim on right without wheeze, + tacypneic  Abdominal: Soft. Bowel sounds are normal. He exhibits no distension and no mass. There is no tenderness. There is no rebound and no guarding.  Musculoskeletal: Normal range of motion. He exhibits no edema, tenderness or deformity.  Lymphadenopathy:    He has no cervical adenopathy.  Neurological: [name] alert and oriented to person, place, and time. He exhibits normal muscle tone.  Skin: Skin is warm and dry. No rash noted. [name] not diaphoretic. No erythema.  Psychiatric: He has a normal mood and affect. His  behavior is normal. Judgment and thought content normal.     Dg Chest Portable 1 View  Result Date: 03/01/2018 CLINICAL DATA:  Worsening shortness of breath for 2 days, history lung cancer, hypertension EXAM: PORTABLE CHEST 1 VIEW COMPARISON:  Portable exam 0707 hours compared to CT chest of 01/31/2018 FINDINGS: Subtotal opacification of the RIGHT hemithorax by combination of pleural effusion and tumor as well as RIGHT lung atelectasis. Mediastinal shift to RIGHT. Normal heart size and pulmonary vascularity. Scattered chronic accentuation of interstitial markings in the LEFT lung. The discrete pulmonary metastases seen in the LEFT lung on the prior CT exam are less well delineated radiographically. No segmental infiltrate or pleural effusion in the LEFT lung. No pneumothorax. Bones demineralized. IMPRESSION: Chronic subtotal opacification of RIGHT hemithorax by known tumor, effusion, and atelectasis. Chronic accentuation of interstitial markings in LEFT lung without superimposed acute infiltrate. Poorly visualized LEFT lung metastases as noted on prior CT. Electronically Signed   By: Lavonia Dana M.D.   On:  03/01/2018 07:41   Results for orders placed or performed during the hospital encounter of 03/01/18 (from the past 24 hour(s))  Basic metabolic panel     Status: Abnormal   Collection Time: 03/01/18  7:01 AM  Result Value Ref Range   Sodium 139 135 - 145 mmol/L   Potassium 3.9 3.5 - 5.1 mmol/L   Chloride 103 98 - 111 mmol/L   CO2 26 22 - 32 mmol/L   Glucose, Bld 116 (H) 70 - 99 mg/dL   BUN 6 (L) 8 - 23 mg/dL   Creatinine, Ser 0.71 0.61 - 1.24 mg/dL   Calcium 8.9 8.9 - 10.3 mg/dL   GFR calc non Af Amer >60 >60 mL/min   GFR calc Af Amer >60 >60 mL/min   Anion gap 10 5 - 15  CBC     Status: Abnormal   Collection Time: 03/01/18  7:01 AM  Result Value Ref Range   WBC 6.5 4.0 - 10.5 K/uL   RBC 5.73 4.22 - 5.81 MIL/uL   Hemoglobin 15.6 13.0 - 17.0 g/dL   HCT 50.8 39.0 - 52.0 %   MCV 88.7  80.0 - 100.0 fL   MCH 27.2 26.0 - 34.0 pg   MCHC 30.7 30.0 - 36.0 g/dL   RDW 16.2 (H) 11.5 - 15.5 %   Platelets 184 150 - 400 K/uL   nRBC 0.0 0.0 - 0.2 %  I-stat troponin, ED     Status: None   Collection Time: 03/01/18  7:04 AM  Result Value Ref Range   Troponin i, poc 0.00 0.00 - 0.08 ng/mL   Comment 3             Assessment:   1 right lung stage 4 carcinoma [id number] non-small cell adenocarcinoma with recurrent pleural effusion with increasing DOE/SOB. + tachycardia and tachypneic.   Recommend IR consult for thoracentesis vs pigtail placement. He currently does not want a pleurx catheter but is willing to discuss if it does become the best option  Patient examined and todays CXR image and most recent CT chest images personally reviewed and discussed with patient I have discussed the patient with Jadene Pierini PA-C and agree with his above assessment. The patient is complaining of shortness of breath with any minimal activity and is doing poorly at home with fatigue, weight loss, and dyspnea not responding to home oxygen.  He has a wet cough but denies fever.  He does have some hemoptysis.  No specific chest pain.  His left lung is completely clear on chest x-ray.  Chest x-ray and most recent CT scan of chest demonstrate consolidation of right lung from combination of tumor, postobstructive atelectasis, and a minimal amount of loculated fluid. Pleurx catheter would not help this patient. Agree with plan for ultrasound-guided possible thoracentesis although the symptomatic improvement with this may not be significant.  The patient is approaching end-of-life condition and recommend palliative care consultation.  The other explanation for the patient's rapid decline would be pulmonary embolus.  Repeat CTA would be needed to evaluate this possibility.  I discussed the situation with the patient and he understands that thoracic surgical intervention would not help him at this stage.  Dahlia Byes MD, CT surgery Plan:

## 2018-03-01 NOTE — Care Management Note (Addendum)
Case Management Note  Patient Details  Name: COPE MARTE MRN: [id number] Date of Birth: 1951/04/14  Subjective/Objective:  From home with spouse, presents with right lung pl effusion, hx of stage iv non small cell lung ca on immunotherapy, new onset of hypoxemia, for thoracentesis ( could not be done) , evaluation by cardiothoracic surgery today, copd, htn.    10/14 Tomi Bamberger RN, BSN - NCM gave patient the eliquis 30 day free coupon and information on his refill co pay amt of 45.00.  NCM spoke with patient about outpatient palliative services, he states he is not sure he wants this service right now, he will let me know tomorrow.  NCM offered to set up [facility] for disease Management for patient he states he is not sure he would want this eiither, he will let NCM know tomorrow.                   Action/Plan: NCM will follow for transition of care needs.   Expected Discharge Date:                  Expected Discharge Plan:  Home/Self Care  In-House Referral:     Discharge planning Services  CM Consult  Post Acute Care Choice:    Choice offered to:     DME Arranged:    DME Agency:     HH Arranged:    HH Agency:     Status of Service:  In process, will continue to follow  If discussed at Long Length of Stay Meetings, dates discussed:    Additional Comments:  Zenon Mayo, RN 03/01/2018, 4:01 PM

## 2018-03-01 NOTE — ED Notes (Signed)
Placed pt cell phone, shirt, hat, glasses, jacket, shoes, money in pt belonging bag and sent with pt to IR

## 2018-03-01 NOTE — ED Notes (Signed)
Patient transported to CT 

## 2018-03-01 NOTE — ED Triage Notes (Signed)
Pt is from home and complains of SOB. Pt has lung cancer and was just started on amilodipine. EMS reports diminished bilateral lung sounds. EMS reports pt was hypertensive however after 1 sl nitro the pt's blood pressure improved as noted. EMS advised 12 lead unremarkable. EMS reports pt refused an IV.  Pt reports he became SOB more with exertion. Pt denies any pain.

## 2018-03-01 NOTE — H&P (Signed)
[facility]                                                                                    Patient Demographics  Bradley Maldonado, is a 67 y.o. male  CSN: [id number]  MRN: [id number]  DOB - 1950-05-31  Admit Date - 03/01/2018  Outpatient Primary MD for the patient is [name], NP-C   With History of -  Past Medical History:  Diagnosis Date  . Adenocarcinoma of right lung, stage 4 ([place]) 01/24/2017  . Goals of care, counseling/discussion 01/24/2017  . Hypertension       Past Surgical History:  Procedure Laterality Date  . CHEST TUBE INSERTION Right 02/06/2017   Procedure: INSERTION PLEURAL DRAINAGE CATHETER;  Surgeon: Ivin Poot, MD;  Location: [place];  Service: Thoracic;  Laterality: Right;  . EYE SURGERY Right    metal removed from eye  . Hip replacement    . IR THORACENTESIS ASP PLEURAL SPACE W/IMG GUIDE  12/19/2016  . REMOVAL OF PLEURAL DRAINAGE CATHETER Right 03/21/2017   Procedure: REMOVAL OF RIGHT PLEURAL DRAINAGE CATHETER;  Surgeon: Ivin Poot, MD;  Location: [place];  Service: Thoracic;  Laterality: Right;    in for   Chief Complaint  Patient presents with  . Shortness of Breath  . Lung Cancer     HPI  Bradley Maldonado  is a 67 y.o. male, with past medical history significant for stage IV non-small cell lung cancer on the right with malignant effusion and history of COPD presenting with 1 week history of increasing shortness of breath, no fever or chest pain , but reports occasional hemoptysis.  No signs or symptoms of DVT.  Patient was started on 5 L/min by nasal cannula although he is on room air at baseline.  Patient could not tolerate CT of the chest due to severe orthopnea.  Patient is being followed by Dr. Earlie Server by weekly treatments with [facility] immunotherapy.  Last treatment was last Thursday.  Portable chest x-ray showed right lung opacification with effusion and atelectasis.  Patient has a history of right-sided chest tube  removed in 2018 by Dr. Darcey Nora.  Patient is scheduled to have thoracentesis right now by Dr. Laurence Ferrari and will be evaluated by cardiothoracic surgery.    Review of Systems    In addition to the HPI above,  No Fever-chills, No Headache, No changes with Vision or hearing, No problems swallowing food or Liquids, No Chest pain,  No Abdominal pain, No Nausea or Vommitting, Bowel movements are regular, No Blood in stool or Urine, No dysuria, No new skin rashes or bruises, No new joints pains-aches,  No new weakness, tingling, numbness in any extremity, No recent weight gain or loss, No polyuria, polydypsia or polyphagia, No significant Mental Stressors.  A full 10 point Review of Systems was done, except as stated above, all other Review of Systems were negative.   Social History Social History   Tobacco Use  . Smoking status: Former Smoker    Last attempt to quit: 01/21/2018    Years since quitting: 0.1  . Smokeless tobacco: Never Used  Substance Use Topics  . Alcohol use:  Yes    Alcohol/week: 14.0 standard drinks    Types: 14 Cans of beer per week    Comment: occasionally  now since cancer diagnosis     Family History Family History  Problem Relation Age of Onset  . Diabetes Mother   . Lung cancer Father   . Cancer Father   . Cancer - Lung Father      Prior to Admission medications   Medication Sig Start Date End Date Taking? Authorizing Provider  amLODipine (NORVASC) 5 MG tablet Take 1 tablet (5 mg total) by mouth daily. 02/28/18  Yes Henson, Vickie L, NP-C  azithromycin (ZITHROMAX) 250 MG tablet Take 2 tablets on day 1, then 1 tablet on days 2-5. Patient not taking: Reported on 03/01/2018 02/08/18   [name], NP-C    No Known Allergies  Physical Exam  Vitals  Blood pressure 138/83, pulse (!) 119, temperature 97.8 F (36.6 C), temperature source Oral, resp. rate (!) 29, height 5' 9.5" (1.765 m), weight 69 kg, SpO2 91 %.   1. General chronically  ill male, looks tired, moderate shortness of breath  2. Normal affect and insight, Not Suicidal or Homicidal, Awake Alert, Oriented X 3.  3. No F.N deficits, grossly, patient moving all extremities.  4. Ears and Eyes appear Normal, Conjunctivae clear, PERRLA. Moist Oral Mucosa.  5. Supple Neck, No JVD, No cervical lymphadenopathy appriciated, No Carotid Bruits.  6. Symmetrical Chest wall movement, decreased breath sounds on the right.  7. RRR, No Gallops, Rubs or Murmurs, No Parasternal Heave.  8. Positive Bowel Sounds, Abdomen Soft, Non tender, No organomegaly appriciated,No rebound -guarding or rigidity.  9.  No Cyanosis, Normal Skin Turgor, No Skin Rash or Bruise.  10. Good muscle tone,  joints appear normal , no effusions, Normal ROM.   Data Review  CBC Recent Labs  Lab 03/01/18 0701  WBC 6.5  HGB 15.6  HCT 50.8  PLT 184  MCV 88.7  MCH 27.2  MCHC 30.7  RDW 16.2*   ------------------------------------------------------------------------------------------------------------------  Chemistries  Recent Labs  Lab 03/01/18 0701  NA 139  K 3.9  CL 103  CO2 26  GLUCOSE 116*  BUN 6*  CREATININE 0.71  CALCIUM 8.9   ------------------------------------------------------------------------------------------------------------------ estimated creatinine clearance is 86.3 mL/min (by C-G formula based on SCr of 0.71 mg/dL). ------------------------------------------------------------------------------------------------------------------ No results for input(s): TSH, T4TOTAL, T3FREE, THYROIDAB in the last 72 hours.  Invalid input(s): FREET3   Coagulation profile No results for input(s): INR, PROTIME in the last 168 hours. ------------------------------------------------------------------------------------------------------------------- No results for input(s): DDIMER in the last 72  hours. -------------------------------------------------------------------------------------------------------------------  Cardiac Enzymes No results for input(s): CKMB, TROPONINI, MYOGLOBIN in the last 168 hours.  Invalid input(s): CK ------------------------------------------------------------------------------------------------------------------ Invalid input(s): POCBNP   ---------------------------------------------------------------------------------------------------------------  Urinalysis No results found for: COLORURINE, APPEARANCEUR, [place], [place], GLUCOSEU, HGBUR, BILIRUBINUR, KETONESUR, PROTEINUR, UROBILINOGEN, NITRITE, LEUKOCYTESUR  ----------------------------------------------------------------------------------------------------------------  ABG   Imaging results:   Ct Chest W Contrast  Result Date: 01/31/2018 CLINICAL DATA:  Stage IV right lung adenocarcinoma diagnosed July 2018 presents for restaging with ongoing immunotherapy. EXAM: CT CHEST, ABDOMEN, AND PELVIS WITH CONTRAST TECHNIQUE: Multidetector CT imaging of the chest, abdomen and pelvis was performed following the standard protocol during bolus administration of intravenous contrast. CONTRAST:  156mL OMNIPAQUE IOHEXOL 300 MG/ML  SOLN COMPARISON:  11/28/2017 CT chest, abdomen and pelvis. FINDINGS: CT CHEST FINDINGS Cardiovascular: Normal heart size. No significant pericardial effusion/thickening. Three-vessel coronary atherosclerosis. Atherosclerotic nonaneurysmal thoracic aorta. Normal caliber pulmonary arteries. No central pulmonary emboli. Mediastinum/Nodes: No  discrete thyroid nodules. Unremarkable esophagus. Mild right axillary adenopathy measuring up to 1.1 cm (series 2/image 33), previously 1.1 cm, stable. No left axillary adenopathy. Enlarged 1.0 cm right pericardiophrenic node (series 2/image 55), stable using similar measurement technique. Mildly enlarged 1.1 cm AP window node (series 2/image 29),  previously 1.0 cm using similar measurement technique, not appreciably changed. Stable enlarged 1.8 cm subcarinal node (series 2/image 35). Stable enlarged 1.1 cm right hilar node (series 2/image 31). No pathologically enlarged left hilar nodes. Lungs/Pleura: No pneumothorax. Prominent volume loss in the right hemithorax, stable. Circumferential irregular prominent right pleural thickening and hyperenhancement throughout the right pleural space with loculated small dependent and subpulmonic right pleural effusion, not appreciably changed. Severe centrilobular emphysema with mild diffuse bronchial wall thickening. Interlobular septal thickening throughout the right lung, unchanged. Consolidation throughout much of the right middle and right lower lobes, suspect a combination of thick tumor and atelectasis, not appreciably changed. Numerous (greater than 10) pulmonary nodules scattered throughout the left lung, stable to mildly increased in size. For example a medial basilar left lower lobe 8 mm nodule (series 7/image 135), increased from 6 mm. Left lower lobe 7 mm nodule (series 7/image 125), minimally increased from 6 mm. Lingular 7 mm nodule (series 7/image 99), minimally increased from 6 mm. Posterior left upper lobe 7 mm nodule (series 7/image 67), stable using similar measurement technique. No new significant pulmonary nodules. Musculoskeletal: No aggressive appearing focal osseous lesions. Mild thoracic spondylosis. CT ABDOMEN PELVIS FINDINGS Hepatobiliary: Normal liver size. No liver mass. Normal gallbladder with no radiopaque cholelithiasis. No biliary ductal dilatation. Pancreas: Normal, with no mass or duct dilation. Spleen: Normal size. No mass. Adrenals/Urinary Tract: Stable 2.1 cm right adrenal (series 2/image 67) and 1.2 cm left adrenal (series 2/image 63) nodules. No hydronephrosis. No renal masses. Bladder obscured by streak artifact from right hip hardware with no gross bladder abnormality.  Stomach/Bowel: Normal non-distended stomach. Normal caliber small bowel with no small bowel wall thickening. Normal appendix. Normal large bowel with no diverticulosis, large bowel wall thickening or pericolonic fat stranding. Vascular/Lymphatic: Atherosclerotic nonaneurysmal abdominal aorta. Patent portal, splenic, hepatic and renal veins. Aortocaval adenopathy measures up to 1.7 cm (series 2/image 80), previously 1.6 cm, not appreciably changed. Enlarged bulky 4.4 cm right common iliac node (series 2/image 89), mildly increased from 4.1 cm using similar measurement technique. Right external iliac adenopathy measuring up to 2.8 cm (series 2/image 110), increased from 2.6 cm. No left pelvic adenopathy. Reproductive: Top-normal size prostate. Other: No pneumoperitoneum, ascites or focal fluid collection. Musculoskeletal: No aggressive appearing focal osseous lesions. Right total hip arthroplasty is partially visualized. Advanced left hip osteoarthritis. Marked lumbar spondylosis. IMPRESSION: 1. Overall, findings are stable to mildly progressed. 2. Extensive circumferential tumor throughout the right pleural space is stable. Loculated small dependent/subpulmonic right pleural effusion is stable. Lymphangitic tumor and consolidative tumor/atelectasis throughout the right lung is stable. 3. Numerous contralateral pulmonary metastases are stable to mildly increased in size. 4. Right axillary, mediastinal and right hilar adenopathy is stable. 5. Bilateral adrenal metastases are stable. 6. Bulky right pelvic adenopathy is mildly increased. Mild-to-moderate retroperitoneal adenopathy is stable. 7. Aortic Atherosclerosis (ICD10-I70.0) and Emphysema (ICD10-J43.9). Electronically Signed   By: Ilona Sorrel M.D.   On: 01/31/2018 17:04   Ct Abdomen Pelvis W Contrast  Result Date: 01/31/2018 CLINICAL DATA:  Stage IV right lung adenocarcinoma diagnosed July 2018 presents for restaging with ongoing immunotherapy. EXAM: CT CHEST,  ABDOMEN, AND PELVIS WITH CONTRAST TECHNIQUE: Multidetector CT imaging of the chest,  abdomen and pelvis was performed following the standard protocol during bolus administration of intravenous contrast. CONTRAST:  136mL OMNIPAQUE IOHEXOL 300 MG/ML  SOLN COMPARISON:  11/28/2017 CT chest, abdomen and pelvis. FINDINGS: CT CHEST FINDINGS Cardiovascular: Normal heart size. No significant pericardial effusion/thickening. Three-vessel coronary atherosclerosis. Atherosclerotic nonaneurysmal thoracic aorta. Normal caliber pulmonary arteries. No central pulmonary emboli. Mediastinum/Nodes: No discrete thyroid nodules. Unremarkable esophagus. Mild right axillary adenopathy measuring up to 1.1 cm (series 2/image 33), previously 1.1 cm, stable. No left axillary adenopathy. Enlarged 1.0 cm right pericardiophrenic node (series 2/image 55), stable using similar measurement technique. Mildly enlarged 1.1 cm AP window node (series 2/image 29), previously 1.0 cm using similar measurement technique, not appreciably changed. Stable enlarged 1.8 cm subcarinal node (series 2/image 35). Stable enlarged 1.1 cm right hilar node (series 2/image 31). No pathologically enlarged left hilar nodes. Lungs/Pleura: No pneumothorax. Prominent volume loss in the right hemithorax, stable. Circumferential irregular prominent right pleural thickening and hyperenhancement throughout the right pleural space with loculated small dependent and subpulmonic right pleural effusion, not appreciably changed. Severe centrilobular emphysema with mild diffuse bronchial wall thickening. Interlobular septal thickening throughout the right lung, unchanged. Consolidation throughout much of the right middle and right lower lobes, suspect a combination of thick tumor and atelectasis, not appreciably changed. Numerous (greater than 10) pulmonary nodules scattered throughout the left lung, stable to mildly increased in size. For example a medial basilar left lower lobe 8 mm  nodule (series 7/image 135), increased from 6 mm. Left lower lobe 7 mm nodule (series 7/image 125), minimally increased from 6 mm. Lingular 7 mm nodule (series 7/image 99), minimally increased from 6 mm. Posterior left upper lobe 7 mm nodule (series 7/image 67), stable using similar measurement technique. No new significant pulmonary nodules. Musculoskeletal: No aggressive appearing focal osseous lesions. Mild thoracic spondylosis. CT ABDOMEN PELVIS FINDINGS Hepatobiliary: Normal liver size. No liver mass. Normal gallbladder with no radiopaque cholelithiasis. No biliary ductal dilatation. Pancreas: Normal, with no mass or duct dilation. Spleen: Normal size. No mass. Adrenals/Urinary Tract: Stable 2.1 cm right adrenal (series 2/image 67) and 1.2 cm left adrenal (series 2/image 63) nodules. No hydronephrosis. No renal masses. Bladder obscured by streak artifact from right hip hardware with no gross bladder abnormality. Stomach/Bowel: Normal non-distended stomach. Normal caliber small bowel with no small bowel wall thickening. Normal appendix. Normal large bowel with no diverticulosis, large bowel wall thickening or pericolonic fat stranding. Vascular/Lymphatic: Atherosclerotic nonaneurysmal abdominal aorta. Patent portal, splenic, hepatic and renal veins. Aortocaval adenopathy measures up to 1.7 cm (series 2/image 80), previously 1.6 cm, not appreciably changed. Enlarged bulky 4.4 cm right common iliac node (series 2/image 89), mildly increased from 4.1 cm using similar measurement technique. Right external iliac adenopathy measuring up to 2.8 cm (series 2/image 110), increased from 2.6 cm. No left pelvic adenopathy. Reproductive: Top-normal size prostate. Other: No pneumoperitoneum, ascites or focal fluid collection. Musculoskeletal: No aggressive appearing focal osseous lesions. Right total hip arthroplasty is partially visualized. Advanced left hip osteoarthritis. Marked lumbar spondylosis. IMPRESSION: 1. Overall,  findings are stable to mildly progressed. 2. Extensive circumferential tumor throughout the right pleural space is stable. Loculated small dependent/subpulmonic right pleural effusion is stable. Lymphangitic tumor and consolidative tumor/atelectasis throughout the right lung is stable. 3. Numerous contralateral pulmonary metastases are stable to mildly increased in size. 4. Right axillary, mediastinal and right hilar adenopathy is stable. 5. Bilateral adrenal metastases are stable. 6. Bulky right pelvic adenopathy is mildly increased. Mild-to-moderate retroperitoneal adenopathy is stable. 7. Aortic Atherosclerosis (ICD10-I70.0)  and Emphysema (ICD10-J43.9). Electronically Signed   By: Ilona Sorrel M.D.   On: 01/31/2018 17:04   Dg Chest Portable 1 View  Result Date: 03/01/2018 CLINICAL DATA:  Worsening shortness of breath for 2 days, history lung cancer, hypertension EXAM: PORTABLE CHEST 1 VIEW COMPARISON:  Portable exam 0707 hours compared to CT chest of 01/31/2018 FINDINGS: Subtotal opacification of the RIGHT hemithorax by combination of pleural effusion and tumor as well as RIGHT lung atelectasis. Mediastinal shift to RIGHT. Normal heart size and pulmonary vascularity. Scattered chronic accentuation of interstitial markings in the LEFT lung. The discrete pulmonary metastases seen in the LEFT lung on the prior CT exam are less well delineated radiographically. No segmental infiltrate or pleural effusion in the LEFT lung. No pneumothorax. Bones demineralized. IMPRESSION: Chronic subtotal opacification of RIGHT hemithorax by known tumor, effusion, and atelectasis. Chronic accentuation of interstitial markings in LEFT lung without superimposed acute infiltrate. Poorly visualized LEFT lung metastases as noted on prior CT. Electronically Signed   By: Lavonia Dana M.D.   On: 03/01/2018 07:41    My personal review of EKG: Sinus tachycardia with PVCs at 119 bpm possible left atrial enlargement and nonspecific ST and  T wave changes  Personally reviewed Old Chart from 02/22/2018  Assessment & Plan  1.  Right lung pleural effusion with history of stage IV non-small cell lung cancer on immunotherapy followed by Dr. Earlie Server .  New onset hypoxemia on 5 L nasal cannula      For thoracentesis today by Dr. Laurence Ferrari      Evaluation by cardiothoracic surgery today      Dr. Earlie Server will follow patient after discharge      Follow chest x-ray in a.m.  2.  History of COPD      Nebulizer treatments  3.  History of hypertension      Continue with Norvasc   DVT Prophylaxis Heparin to start after procedure  AM Labs Ordered, also please review Full Orders  Code Status full  Disposition Plan: Home  Time spent in minutes : 37 minutes  Condition GUARDED   @SIGNATURE @

## 2018-03-01 NOTE — ED Notes (Signed)
Attempted to call report to 2W RN, RN off floor.

## 2018-03-02 ENCOUNTER — Inpatient Hospital Stay: Admission: RE | Admit: 2018-03-02 | Payer: Medicare Other | Source: Ambulatory Visit

## 2018-03-02 ENCOUNTER — Inpatient Hospital Stay (HOSPITAL_COMMUNITY): Payer: Medicare Other

## 2018-03-02 ENCOUNTER — Inpatient Hospital Stay
Admission: RE | Admit: 2018-03-02 | Discharge: 2018-03-02 | Disposition: A | Discharge status: Home / Self Care | Payer: Medicare Other | Source: Ambulatory Visit | Attending: Ophthalmology | Admitting: Ophthalmology

## 2018-03-02 DIAGNOSIS — J9601 Acute respiratory failure with hypoxia: Secondary | ICD-10-CM

## 2018-03-02 LAB — BASIC METABOLIC PANEL
ANION GAP: 10 (ref 5–15)
BUN: 13 mg/dL (ref 8–23)
CO2: 27 mmol/L (ref 22–32)
Calcium: 9 mg/dL (ref 8.9–10.3)
Chloride: 102 mmol/L (ref 98–111)
Creatinine, Ser: 0.77 mg/dL (ref 0.61–1.24)
GFR calc Af Amer: >60 mL/min (ref >60)
GFR calc non Af Amer: >60 mL/min (ref >60)
GLUCOSE: 139 mg/dL — AB (ref 70–99)
POTASSIUM: 3.8 mmol/L (ref 3.5–5.1)
SODIUM: 139 mmol/L (ref 135–145)

## 2018-03-02 LAB — HEPARIN LEVEL (UNFRACTIONATED): HEPARIN UNFRACTIONATED: 0.15 [IU]/mL — AB (ref 0.30–0.70)

## 2018-03-02 LAB — HIV ANTIBODY (ROUTINE TESTING W REFLEX): HIV Screen 4th Generation wRfx: NONREACTIVE

## 2018-03-02 LAB — CBC
HCT: 44.6 % (ref 39.0–52.0)
HCT: 47.4 % (ref 39.0–52.0)
HEMOGLOBIN: 14 g/dL (ref 13.0–17.0)
Hemoglobin: 14 g/dL (ref 13.0–17.0)
MCH: 27.4 pg (ref 26.0–34.0)
MCH: 26.7 pg (ref 26.0–34.0)
MCHC: 31.4 g/dL (ref 30.0–36.0)
MCHC: 29.5 g/dL — ABNORMAL LOW (ref 30.0–36.0)
MCV: 87.3 fL (ref 80.0–100.0)
MCV: 90.3 fL (ref 80.0–100.0)
Platelets: 179 10*3/uL (ref 150–400)
Platelets: 191 10*3/uL (ref 150–400)
RBC: 5.11 MIL/uL (ref 4.22–5.81)
RBC: 5.25 MIL/uL (ref 4.22–5.81)
RDW: 16 % — ABNORMAL HIGH (ref 11.5–15.5)
RDW: 16.3 % — ABNORMAL HIGH (ref 11.5–15.5)
WBC: 7.6 10*3/uL (ref 4.0–10.5)
WBC: 7.6 10*3/uL (ref 4.0–10.5)
nRBC: 0 % (ref 0.0–0.2)
nRBC: 0 % (ref 0.0–0.2)

## 2018-03-02 MED ORDER — HEPARIN BOLUS VIA INFUSION
2000.0000 [IU] | Freq: Once | INTRAVENOUS | Status: AC
  Administered 2018-03-02: 2000 [IU] via INTRAVENOUS
  Filled 2018-03-02: qty 2000

## 2018-03-02 MED ORDER — HEPARIN (PORCINE) IN NACL 100-0.45 UNIT/ML-% IJ SOLN
1350.0000 [IU]/h | INTRAMUSCULAR | Status: AC
  Administered 2018-03-02: 950 [IU]/h via INTRAVENOUS
  Administered 2018-03-03: 1100 [IU]/h via INTRAVENOUS
  Administered 2018-03-04: 1250 [IU]/h via INTRAVENOUS
  Filled 2018-03-02 (×3): qty 250

## 2018-03-02 MED ORDER — IOPAMIDOL (ISOVUE-370) INJECTION 76%
INTRAVENOUS | Status: AC
  Filled 2018-03-02: qty 100

## 2018-03-02 MED ORDER — METHYLPREDNISOLONE SODIUM SUCC 125 MG IJ SOLR
60.0000 mg | Freq: Every day | INTRAMUSCULAR | Status: DC
  Administered 2018-03-02 – 2018-03-04 (×3): 60 mg via INTRAVENOUS
  Filled 2018-03-02 (×3): qty 2

## 2018-03-02 MED ORDER — IPRATROPIUM-ALBUTEROL 0.5-2.5 (3) MG/3ML IN SOLN
3.0000 mL | Freq: Three times a day (TID) | RESPIRATORY_TRACT | Status: DC
  Administered 2018-03-02 – 2018-03-03 (×3): 3 mL via RESPIRATORY_TRACT
  Filled 2018-03-02 (×3): qty 3

## 2018-03-02 MED ORDER — IOPAMIDOL (ISOVUE-370) INJECTION 76%
100.0000 mL | Freq: Once | INTRAVENOUS | Status: AC | PRN
  Administered 2018-03-02: 100 mL via INTRAVENOUS

## 2018-03-02 NOTE — Progress Notes (Signed)
[facility] Progress Note  Patient: Bradley Maldonado   PCP: [name], NP-C DOB: Aug 06, 1950   DOA: 03/01/2018   DOS: 03/02/2018   Date of Service: the patient was seen and examined on 03/02/2018  Brief hospital course: Pt. with PMH of stage IV non-small cell lung cancer with malignant effusion on the right, COPD, HTN; admitted on 03/01/2018, presented with complaint of shortness of breath, was found to have acute hypoxic respiratory failure, acute left pulmonary embolism. Currently further plan is continue current care.  Subjective: Starting to have complaint on the left side of the chest pain. Have severe shortness of breath.  Is preferring to lean on the right side to ease his breathing.  Assessment and Plan: 1.  Acute hypoxic respiratory failure. Malignant pleural effusion Acute left pulmonary embolism Hemoptysis  Known history of COPD as well as right-sided lung cancer but now started to develop 1 week of shortness of breath some hemoptysis orthopnea and PND. Referring to laying on the right side. Currently requiring 6 L of oxygen from low oxygen at home. Chest x-ray shows opacification of the right lung.  And there was some concern for pleural effusion. Cardiothoracic was consulted for Pleurx catheter placement but they felt that the patient will not get any benefit out of the Pleurx catheter. IR was consulted for thoracentesis although ultrasound was negative for any significant fluid that they can drain. Patient does have large fluid collection with thick necrotic rim around it consistent with his malignancy. CT chest PE protocol was performed which is positive for left upper lobe obstructive emboli and left middle lobe emboli as well these are not large enough to explain the degree of the hypoxia and the patient has. There is a possibility that the patient may have pneumonitis from him being on Keytruda and I will start the patient on 1 mg/kg steroids.   This was discussed with on-call oncology. I will monitor the patient for any evidence of infection and will have low threshold to start the patient on broad-spectrum IV antibiotics. Although no evidence of new consolidation and a CT scan of the chest. Continue pain control with Dilaudid and oral narcotics. We will start the patient on IV heparin after discussion. Given his history of hemoptysis we will start with a lower bolus and continue with IV heparin infusion. Pharmacy consulted.  2.  History of COPD. Continue nebulizer.  Added steroids.  Patient does not have any significant bilateral wheezing concerning for active COPD exacerbation.  3.  Essential hypertension. Patient is on amlodipine 5 mg daily. Blood pressure was significantly elevated initially but now appears to have well controlled. Monitor closely a low threshold to discontinue amlodipine.  4.  Dyspnea with hypoxia. I do appreciate cardiomegaly on the CT scan. We will get echocardiogram.  Diet: Cardiac diet DVT Prophylaxis: subcutaneous Heparin  Advance goals of care discussion: full code for now.  I have concerned that the patient is deteriorating faster. I will also concerned that the patient may not survive this hospitalization given that there is not significant have changed on his CT scan but the patient is significantly symptomatic. Palliative care consulted for further discussion of goals of care in this patient.  Family Communication: no family was present at bedside, at the time of interview.    Disposition:  Discharge to be determined.  Consultants: Cardiac thoracic surgery, oncology phone consultation Procedures: Echocardiogram   Scheduled Meds: . amLODipine  5 mg Oral Daily  . azithromycin  500  mg Oral Daily  . iopamidol      . ipratropium-albuterol  3 mL Nebulization TID  . sodium chloride flush  3 mL Intravenous Q12H   Continuous Infusions: . sodium chloride    . heparin 950 Units/hr (03/02/18  1609)   PRN Meds: sodium chloride, acetaminophen **OR** acetaminophen, albuterol, HYDROcodone-acetaminophen, HYDROmorphone (DILAUDID) injection, ondansetron **OR** ondansetron (ZOFRAN) IV, sodium chloride flush, zolpidem Antibiotics: Anti-infectives (From admission, onward)   Start     Dose/Rate Route Frequency Ordered Stop   03/01/18 1600  azithromycin (ZITHROMAX) tablet 500 mg    Note to Pharmacy:  Take 2 tablets on day 1, then 1 tablet on days 2-5.     500 mg Oral Daily 03/01/18 1559         Objective: Physical Exam: Vitals:   03/02/18 0932 03/02/18 1152 03/02/18 1315 03/02/18 1559  BP:  113/81  101/67  Pulse:  99 (!) 102 (!) 104  Resp:  18 16 16   Temp:  98.5 F (36.9 C)  98.3 F (36.8 C)  TempSrc:  Oral  Oral  SpO2: 92% 96% 95% 90%  Weight:      Height:        Intake/Output Summary (Last 24 hours) at 03/02/2018 1643 Last data filed at 03/02/2018 1300 Gross per 24 hour  Intake 720 ml  Output 835 ml  Net -115 ml   Filed Weights   03/01/18 0657  Weight: 69 kg   General: Alert, Awake and Oriented to Time, Place and Person. Appear in moderate distress, affect appropriate Eyes: PERRL, Conjunctiva normal ENT: Oral Mucosa clear moist. Neck: no JVD, no Abnormal Mass Or lumps Cardiovascular: S1 and S2 Present, no Murmur, Peripheral Pulses Present Respiratory: increased respiratory effort, Bilateral Air entry equal and Decreased, no use of accessory muscle, right Crackles, right wheezes Abdomen: Bowel Sound present, Soft and no tenderness, no hernia Skin: no redness, no Rash, no induration Extremities: no Pedal edema, no calf tenderness Neurologic: Grossly no focal neuro deficit. Bilaterally Equal motor strength  Data Reviewed: CBC: Recent Labs  Lab 03/01/18 0701 03/02/18 0308  WBC 6.5 7.6  HGB 15.6 14.0  HCT 50.8 44.6  MCV 88.7 87.3  PLT 184 174   Basic Metabolic Panel: Recent Labs  Lab 03/01/18 0701 03/02/18 0308  NA 139 139  K 3.9 3.8  CL 103 102  CO2  26 27  GLUCOSE 116* 139*  BUN 6* 13  CREATININE 0.71 0.77  CALCIUM 8.9 9.0    Liver Function Tests: No results for input(s): AST, ALT, ALKPHOS, BILITOT, PROT, ALBUMIN in the last 168 hours. No results for input(s): LIPASE, AMYLASE in the last 168 hours. No results for input(s): AMMONIA in the last 168 hours. Coagulation Profile: Recent Labs  Lab 03/01/18 1628  INR 1.16   Cardiac Enzymes: No results for input(s): CKTOTAL, CKMB, CKMBINDEX, TROPONINI in the last 168 hours. BNP (last 3 results) No results for input(s): PROBNP in the last 8760 hours. CBG: No results for input(s): GLUCAP in the last 168 hours. Studies: X-ray Chest Pa And Lateral  Result Date: 03/02/2018 CLINICAL DATA:  Pleural effusion, cough, shortness of breath, history RIGHT lung cancer EXAM: CHEST - 2 VIEW COMPARISON:  03/01/2018 FINDINGS: Normal heart size, mediastinal contours, and pulmonary vascularity. Chronic interstitial prominence in the LEFT lung. Persistent RIGHT pleural effusion and basilar atelectasis, patient with known tumor in inferior RIGHT hemithorax. Diffuse infiltrate throughout remaining aerated RIGHT lung; this could represent infection or lymphangitic tumor spread. No discrete LEFT lung mass  or pneumothorax. Bones demineralized. IMPRESSION: Persistent opacification of the inferior half of the RIGHT hemithorax by combination of pleural effusion, atelectasis and known tumor. Persistent chronic infiltrate in residual aerated RIGHT lung with scattered interstitial infiltrates in the LEFT lung, could represent infection though lymphangitic tumor spread could cause a similar pattern. Electronically Signed   By: Lavonia Dana M.D.   On: 03/02/2018 10:44   Ct Angio Chest Pe W Or Wo Contrast  Result Date: 03/02/2018 CLINICAL DATA:  New onset hypoxemia. History of metastatic non-small cell lung cancer. EXAM: CT ANGIOGRAPHY CHEST WITH CONTRAST TECHNIQUE: Multidetector CT imaging of the chest was performed using  the standard protocol during bolus administration of intravenous contrast. Multiplanar CT image reconstructions and MIPs were obtained to evaluate the vascular anatomy. CONTRAST:  14mL ISOVUE-370 IOPAMIDOL (ISOVUE-370) INJECTION 76% COMPARISON:  CT chest dated January 31, 2018. FINDINGS: Cardiovascular: New acute lobar and segmental pulmonary emboli involving the left upper lobe and anterior segment. Emboli in the left upper lobe anterior segment are occlusive. Nonocclusive emboli in subsegmental superior lingular branches. No left lower lobe or right-sided pulmonary emboli. Normal RV/LV ratio of 0.84. Normal heart size. No pericardial effusion. Normal caliber thoracic aorta. Coronary, aortic arch, and branch vessel atherosclerotic vascular disease. Mediastinum/Nodes: Unchanged mild right axillary lymphadenopathy measuring up to 1.1 cm. No left axillary lymphadenopathy. Unchanged enlarged 1.0 cm right cardiophrenic lymph node. Previously seen enlarged aortopulmonary window, subcarinal, and right hilar lymph nodes are not as well visualized due to phase of contrast, but appear grossly unchanged. The thyroid gland, trachea, and esophagus demonstrate no significant findings. Lungs/Pleura: Stable volume loss in the right hemithorax. Unchanged irregular prominent right-sided pleural thickening and hyperenhancement with small loculated dependent and subpulmonic right pleural effusion. Unchanged interlobular septal thickening throughout the right lung. Stable consolidation throughout much of the right middle and right lower lobes. Numerous pulmonary nodules scattered throughout the left lung are stable to slightly increased in size. For example a posterior left upper lobe nodule now measures 9 mm, previously 7 mm. Severe centrilobular emphysema and mild diffuse bronchial wall thickening again noted. No pneumothorax. Upper Abdomen: No acute abnormality. Musculoskeletal: Unchanged small lytic metastases involving the  right posterolateral eighth rib and right posterior levin through. Review of the MIP images confirms the above findings. IMPRESSION: 1. New acute lobar and segmental pulmonary emboli involving the left upper lobe. Emboli in the left upper lobe anterior segment are occlusive. No evidence of right heart strain. 2. Unchanged extensive circumferential tumor in the right pleural space. Unchanged lymphangitic carcinomatosis and consolidative tumor/atelectasis throughout the right lung. Unchanged loculated subpulmonic right pleural effusion. 3. Stable to mildly increased left lung metastases. 4. Stable mediastinal, right hilar, and right axillary lymphadenopathy. 5.  Emphysema (ICD10-J43.9). 6.  Aortic atherosclerosis (ICD10-I70.0). Critical Value/emergent results were called by telephone at the time of interpretation on 03/02/2018 at 1:47 pm to Dr. Berle Mull , who verbally acknowledged these results. Electronically Signed   By: Titus Dubin M.D.   On: 03/02/2018 13:47     Time spent: 35 minutes  Author: Berle Mull, MD [facility] Pager: [phone] 03/02/2018 4:43 PM  If 7PM-7AM, please contact night-coverage at [email or web address], password [facility]

## 2018-03-02 NOTE — Progress Notes (Signed)
Patient was transported to CT and back on a NRB mask. RT standby due to patient's shortness of breath when he lays flat. Patient tolerated well without any incidents.

## 2018-03-02 NOTE — Progress Notes (Addendum)
ANTICOAGULATION CONSULT NOTE   Pharmacy Consult for Heparin  Indication: pulmonary embolus  No Known Allergies  Patient Measurements: Height: 5' 9.5" (176.5 cm) Weight: 152 lb 1.9 oz (69 kg) IBW/kg (Calculated) : 71.85 Heparin Dosing Weight: 69 kg  Vital Signs: Temp: 97.4 F (36.3 C) (10/11 2308) Temp Source: Oral (10/11 2308) BP: 104/70 (10/11 2308) Pulse Rate: 101 (10/11 2308)  Labs: Recent Labs    03/01/18 0701 03/01/18 1628 03/02/18 0308 03/02/18 2140  HGB 15.6  --  14.0 14.0  HCT 50.8  --  44.6 47.4  PLT 184  --  179 191  LABPROT  --  14.8  --   --   INR  --  1.16  --   --   HEPARINUNFRC  --   --   --  0.15*  CREATININE 0.71  --  0.77  --     Estimated Creatinine Clearance: 86.3 mL/min (by C-G formula based on SCr of 0.77 mg/dL).   Medical History: Past Medical History:  Diagnosis Date  . Adenocarcinoma of right lung, stage 4 ([place]) 01/24/2017  . Goals of care, counseling/discussion 01/24/2017  . Hypertension     Medications:  Medications Prior to Admission  Medication Sig Dispense Refill Last Dose  . amLODipine (NORVASC) 5 MG tablet Take 1 tablet (5 mg total) by mouth daily. 30 tablet 0 02/28/2018 at Unknown time  . azithromycin (ZITHROMAX) 250 MG tablet Take 2 tablets on day 1, then 1 tablet on days 2-5. (Patient not taking: Reported on 03/01/2018) 6 tablet 0 Not Taking at Unknown time   Scheduled:  . amLODipine  5 mg Oral Daily  . azithromycin  500 mg Oral Daily  . heparin  2,000 Units Intravenous Once  . iopamidol      . ipratropium-albuterol  3 mL Nebulization TID  . methylPREDNISolone (SOLU-MEDROL) injection  60 mg Intravenous Daily  . sodium chloride flush  3 mL Intravenous Q12H    Assessment: 67 y.[facility]  complaints of SOB.  Admitted with recuurent right lung pleural effusion , new onset hypoxemia in setting of stage IV non-small cell lung cancer.  CT angio today is + for acute PE in left upper lobe.  No evidence of right heart strain.  Pharmacy consulted to start IV heparin infusion for acute PE.  Report of occasional hemoptysis noted and MD is aware.   I asked Dr Posey Pronto about bolus heparin since patient already has occasional hemoptysis noted. Dr. Posey Pronto requested to reduce bolus less than the usual 50-70 unit/kg dosing for bolus.  CBC and SCr are within normal limits. Last received SQ heparin 5000 units at 0636 today.>>discontinue.  10/11 PM update: heparin level is low this evening, Hgb stable, no issues per RN.    Goal of Therapy:  Heparin level = 0.3-0.5 units/ml Monitor platelets by anticoagulation protocol: Yes   Plan:  Heparin 2000 units re-bolus Heparin drip to 1100 units/hr 0730 HL Target HL 0.3-0.5 units/hr due to hemoptysis. Daily heparin level, CBC, monitor for signs and symptoms of bleeding.    Narda Bonds, PharmD, BCPS Clinical Pharmacist Phone: [phone]

## 2018-03-02 NOTE — Progress Notes (Addendum)
ANTICOAGULATION CONSULT NOTE - Initial Consult  Pharmacy Consult for Heparin  Indication: pulmonary embolus  No Known Allergies  Patient Measurements: Height: 5' 9.5" (176.5 cm) Weight: 152 lb 1.9 oz (69 kg) IBW/kg (Calculated) : 71.85 Heparin Dosing Weight: 69 kg  Vital Signs: Temp: 98.5 F (36.9 C) (10/11 1152) Temp Source: Oral (10/11 1152) BP: 113/81 (10/11 1152) Pulse Rate: 102 (10/11 1315)  Labs: Recent Labs    03/01/18 0701 03/01/18 1628 03/02/18 0308  HGB 15.6  --  14.0  HCT 50.8  --  44.6  PLT 184  --  179  LABPROT  --  14.8  --   INR  --  1.16  --   CREATININE 0.71  --  0.77    Estimated Creatinine Clearance: 86.3 mL/min (by C-G formula based on SCr of 0.77 mg/dL).   Medical History: Past Medical History:  Diagnosis Date  . Adenocarcinoma of right lung, stage 4 ([place]) 01/24/2017  . Goals of care, counseling/discussion 01/24/2017  . Hypertension     Medications:  Medications Prior to Admission  Medication Sig Dispense Refill Last Dose  . amLODipine (NORVASC) 5 MG tablet Take 1 tablet (5 mg total) by mouth daily. 30 tablet 0 02/28/2018 at Unknown time  . azithromycin (ZITHROMAX) 250 MG tablet Take 2 tablets on day 1, then 1 tablet on days 2-5. (Patient not taking: Reported on 03/01/2018) 6 tablet 0 Not Taking at Unknown time   Scheduled:  . amLODipine  5 mg Oral Daily  . azithromycin  500 mg Oral Daily  . heparin  5,000 Units Subcutaneous Q8H  . iopamidol      . ipratropium-albuterol  3 mL Nebulization TID  . sodium chloride flush  3 mL Intravenous Q12H    Assessment: 67 [facility]  complaints of SOB.  Admitted with recuurent right lung pleural effusion , new onset hypoxemia in setting of stage IV non-small cell lung cancer.  CT angio today is + for acute PE in left upper lobe. No evidence of right heart strain.  Pharmacy consulted to start IV heparin infusion for acute PE.  Report of occasional hemoptysis noted  and MD is aware.   I asked Dr Posey Pronto about bolus heparin since patient already has occasional hemoptysis noted. Dr. Posey Pronto requested to reduce bolus less than the usual 50-70 unit/kg dosing for bolus.  CBC and SCr are within normal limits. Last received SQ heparin 5000 units at 0636 today.>>discontinue.   Goal of Therapy:  Heparin level = 0.3-0.5 units/ml Monitor platelets by anticoagulation protocol: Yes   Plan:  Discontinue SQ heparin Heparin 2000 units (reduced bolus) Heparin 950 units/hr  Heparin level and CBC in 6 hrs  Target HL 0.3-0.5 units/hr due to hemoptysis. Daily heparin level, CBC, monitor for signs and symptoms of bleeding.     Thank you for allowing pharmacy to be part of this patients care team. Nicole Cella, RPh Clinical Pharmacist Please check AMION for all [place] phone numbers After 10:00 PM, call [place] [phone] 03/02/2018,2:50 PM

## 2018-03-03 ENCOUNTER — Inpatient Hospital Stay (HOSPITAL_COMMUNITY): Payer: Medicare Other

## 2018-03-03 DIAGNOSIS — R0602 Shortness of breath: Secondary | ICD-10-CM

## 2018-03-03 LAB — BASIC METABOLIC PANEL
ANION GAP: 9 (ref 5–15)
BUN: 10 mg/dL (ref 8–23)
CALCIUM: 8.9 mg/dL (ref 8.9–10.3)
CHLORIDE: 102 mmol/L (ref 98–111)
CO2: 27 mmol/L (ref 22–32)
CREATININE: 0.76 mg/dL (ref 0.61–1.24)
GFR calc Af Amer: >60 mL/min (ref >60)
GFR calc non Af Amer: >60 mL/min (ref >60)
Glucose, Bld: 158 mg/dL — ABNORMAL HIGH (ref 70–99)
Potassium: 4.5 mmol/L (ref 3.5–5.1)
SODIUM: 138 mmol/L (ref 135–145)

## 2018-03-03 LAB — HEPARIN LEVEL (UNFRACTIONATED)
HEPARIN UNFRACTIONATED: 0.27 [IU]/mL — AB (ref 0.30–0.70)
Heparin Unfractionated: 0.31 IU/mL (ref 0.30–0.70)
Heparin Unfractionated: 0.34 IU/mL (ref 0.30–0.70)

## 2018-03-03 LAB — ECHOCARDIOGRAM COMPLETE
HEIGHTINCHES: 69.5 in
Weight: 2433.88 oz

## 2018-03-03 LAB — MAGNESIUM: MAGNESIUM: 2.1 mg/dL (ref 1.7–2.4)

## 2018-03-03 MED ORDER — IPRATROPIUM-ALBUTEROL 0.5-2.5 (3) MG/3ML IN SOLN
3.0000 mL | Freq: Two times a day (BID) | RESPIRATORY_TRACT | Status: DC
  Administered 2018-03-03 – 2018-03-05 (×4): 3 mL via RESPIRATORY_TRACT
  Filled 2018-03-03 (×6): qty 3

## 2018-03-03 MED ORDER — SENNOSIDES-DOCUSATE SODIUM 8.6-50 MG PO TABS
1.0000 | ORAL_TABLET | Freq: Two times a day (BID) | ORAL | Status: DC
  Administered 2018-03-03 – 2018-03-06 (×7): 1 via ORAL
  Filled 2018-03-03 (×7): qty 1

## 2018-03-03 MED ORDER — POLYETHYLENE GLYCOL 3350 17 G PO PACK
17.0000 g | PACK | Freq: Every day | ORAL | Status: DC
  Administered 2018-03-03 – 2018-03-06 (×4): 17 g via ORAL
  Filled 2018-03-03 (×4): qty 1

## 2018-03-03 NOTE — Progress Notes (Signed)
VASCULAR LAB PRELIMINARY  PRELIMINARY  PRELIMINARY  PRELIMINARY  Bilateral lower extremity venous duplex completed.    Preliminary report:  There is no DVT or SVT noted in the bilateral lower extremities.   Eryk Beavers, RVT 03/03/2018, 4:19 PM

## 2018-03-03 NOTE — Progress Notes (Signed)
*  PRELIMINARY RESULTS* Echocardiogram 2D Echocardiogram has been performed.  Bradley Maldonado 03/03/2018, 4:13 PM

## 2018-03-03 NOTE — Progress Notes (Signed)
ANTICOAGULATION CONSULT NOTE - Follow Up Consult  Pharmacy Consult for Heparin Indication: pulmonary embolus  No Known Allergies  Patient Measurements: Height: 5' 9.5" (176.5 cm) Weight: 152 lb 1.9 oz (69 kg) IBW/kg (Calculated) : 71.85 Heparin Dosing Weight: 69  Vital Signs: Temp: 97.6 F (36.4 C) (10/12 2036) Temp Source: Oral (10/12 2036) BP: 111/65 (10/12 2036) Pulse Rate: 94 (10/12 2036)  Labs: Recent Labs    03/01/18 0701 03/01/18 1628 03/02/18 0308  03/02/18 2140 03/03/18 0647 03/03/18 0649 03/03/18 1336 03/03/18 2012  HGB 15.6  --  14.0  --  14.0  --   --   --   --   HCT 50.8  --  44.6  --  47.4  --   --   --   --   PLT 184  --  179  --  191  --   --   --   --   LABPROT  --  14.8  --   --   --   --   --   --   --   INR  --  1.16  --   --   --   --   --   --   --   HEPARINUNFRC  --   --   --    < > 0.15*  --  0.31 0.27* 0.34  CREATININE 0.71  --  0.77  --   --  0.76  --   --   --    < > = values in this interval not displayed.    Estimated Creatinine Clearance: 86.3 mL/min (by C-G formula based on SCr of 0.76 mg/dL).   Medications:  Scheduled:  . amLODipine  5 mg Oral Daily  . azithromycin  500 mg Oral Daily  . ipratropium-albuterol  3 mL Nebulization BID  . methylPREDNISolone (SOLU-MEDROL) injection  60 mg Intravenous Daily  . polyethylene glycol  17 g Oral Daily  . senna-docusate  1 tablet Oral BID  . sodium chloride flush  3 mL Intravenous Q12H    Assessment: 67 year old male presenting to ED with complaints of SOB. Admitted with recurrent right lung pleural effusion, new onset hypoxemia in setting of stage IV non-small cell lung cancer. CT angio today positive for acute PE in left upper load. No evidence of right-heart strain. Pharmacy consulted to start IV heparin infusion. Previous report of occasional hemoptysis noted, MD is aware.   Heparin level currently therapeutic at 0.34.  Goal of Therapy:  Heparin level = 0.3 - 0.5 units/mL Monitor  platelets by anticoagulation protocol: Yes   Plan:  Continue heparin drip at 1250 units / hr Target HL 0.3-0.5 units/hr due to hemopytsis Daily heparin level, CBC, monitor for signs and symptoms for bleeding.     Angus Seller, PharmD Pharmacy Resident Please check AMION for all [place] phone numbers After 10:00 PM, call [place] [phone] 03/03/2018 9:00 PM

## 2018-03-03 NOTE — Progress Notes (Signed)
Pt not placed on CPAP. Rt is concern with risk of aspirations since pt sounds very congested.  Pt is on 6L [place].  Pt looks comfortable. Will continue to monitor.

## 2018-03-03 NOTE — Progress Notes (Signed)
ANTICOAGULATION CONSULT NOTE   Pharmacy Consult for Heparin  Indication: pulmonary embolus  No Known Allergies  Patient Measurements: Height: 5' 9.5" (176.5 cm) Weight: 152 lb 1.9 oz (69 kg) IBW/kg (Calculated) : 71.85 Heparin Dosing Weight: 69 kg  Vital Signs: Temp: 97.9 F (36.6 C) (10/12 0700) Temp Source: Oral (10/12 0700) BP: 114/68 (10/12 0700) Pulse Rate: 82 (10/12 0700)  Labs: Recent Labs    03/01/18 0701 03/01/18 1628 03/02/18 0308 03/02/18 2140 03/03/18 0649  HGB 15.6  --  14.0 14.0  --   HCT 50.8  --  44.6 47.4  --   PLT 184  --  179 191  --   LABPROT  --  14.8  --   --   --   INR  --  1.16  --   --   --   HEPARINUNFRC  --   --   --  0.15* 0.31  CREATININE 0.71  --  0.77  --   --     Estimated Creatinine Clearance: 86.3 mL/min (by C-G formula based on SCr of 0.77 mg/dL).   Medical History: Past Medical History:  Diagnosis Date  . Adenocarcinoma of right lung, stage 4 ([place]) 01/24/2017  . Goals of care, counseling/discussion 01/24/2017  . Hypertension     Medications:  Medications Prior to Admission  Medication Sig Dispense Refill Last Dose  . amLODipine (NORVASC) 5 MG tablet Take 1 tablet (5 mg total) by mouth daily. 30 tablet 0 02/28/2018 at Unknown time  . azithromycin (ZITHROMAX) 250 MG tablet Take 2 tablets on day 1, then 1 tablet on days 2-5. (Patient not taking: Reported on 03/01/2018) 6 tablet 0 Not Taking at Unknown time   Scheduled:  . amLODipine  5 mg Oral Daily  . azithromycin  500 mg Oral Daily  . ipratropium-albuterol  3 mL Nebulization TID  . methylPREDNISolone (SOLU-MEDROL) injection  60 mg Intravenous Daily  . sodium chloride flush  3 mL Intravenous Q12H    Assessment: 67 y.o Maldon[facility]  complaints of SOB.  Admitted with recuurent right lung pleural effusion , new onset hypoxemia in setting of stage IV non-small cell lung cancer.  CT angio today is + for acute PE in left upper lobe. No evidence  of right heart strain.  Pharmacy consulted to start IV heparin infusion for acute PE.  Report of occasional hemoptysis noted and MD is aware.   I asked Dr Posey Pronto about bolus heparin since patient already has occasional hemoptysis noted. Dr. Posey Pronto requested to reduce bolus less than the usual 50-70 unit/kg dosing for bolus.  CBC and SCr are within normal limits. Last received SQ heparin 5000 units at 0636 today.>>discontinue.  10/12 AM update: heparin level therapeutic x 1 after rate increase   Goal of Therapy:  Heparin level = 0.3-0.5 units/ml Monitor platelets by anticoagulation protocol: Yes   Plan:  Heparin drip 1100 units/hr 1400 confirmatory heparin level Target HL 0.3-0.5 units/hr due to hemoptysis. Daily heparin level, CBC, monitor for signs and symptoms of bleeding.    Narda Bonds, PharmD, BCPS Clinical Pharmacist Phone: [phone]

## 2018-03-03 NOTE — Progress Notes (Signed)
[facility] Progress Note  Patient: Bradley Maldonado FTD:[id number]   PCP: [name], NP-C DOB: [birth year]   DOA: 03/01/2018   DOS: 03/03/2018   Date of Service: the patient was seen and examined on 03/03/2018  Brief hospital course: Pt. with PMH of stage IV non-small cell lung cancer with malignant effusion on the right, COPD, HTN; admitted on 03/01/2018, presented with complaint of shortness of breath, was found to have acute hypoxic respiratory failure, acute left pulmonary embolism. Currently further plan is continue current care.  Subjective: Pain well controlled, breathing better not back to baseline.  No nausea no vomiting.  Still on oxygen.  Assessment and Plan: 1.  Acute hypoxic respiratory failure. Malignant pleural effusion Acute left pulmonary embolism Hemoptysis  Known history of COPD as well as right-sided lung cancer but now started to develop 1 week of shortness of breath some hemoptysis orthopnea and PND. Referring to laying on the right side. Currently requiring 6 L of oxygen from low oxygen at home. Chest x-ray shows opacification of the right lung.  And there was some concern for pleural effusion. Cardiothoracic was consulted for Pleurx catheter placement but they felt that the patient will not get any benefit out of the Pleurx catheter. IR was consulted for thoracentesis although ultrasound was negative for any significant fluid that they can drain. Patient does have large fluid collection with thick necrotic rim around it consistent with his malignancy. CT chest PE protocol was performed which is positive for left upper lobe obstructive emboli and left middle lobe emboli as well these are not large enough to explain the degree of the hypoxia and the patient has. There is a possibility that the patient may have pneumonitis from him being on Keytruda and I will start the patient on 1 mg/kg steroids.  This was discussed with on-call oncology. I will monitor  the patient for any evidence of infection and will have low threshold to start the patient on broad-spectrum IV antibiotics.  Continue oral azithromycin for now Although no evidence of new consolidation and a CT scan of the chest. Continue pain control with Dilaudid and oral narcotics. We started the patient on IV heparin after discussion. Given his history of hemoptysis patient received lower bolus. Pharmacy consulted.  2.  History of COPD. Continue nebulizer.  Added steroids.  Patient does not have any significant bilateral wheezing concerning for active COPD exacerbation.  3.  Essential hypertension. Patient is on amlodipine 5 mg daily. Blood pressure was significantly elevated initially but now appears to have well controlled. Monitor closely a low threshold to discontinue amlodipine.  4.  Dyspnea with hypoxia. I do appreciate cardiomegaly on the CT scan. We will get echocardiogram.  Diet: Cardiac diet DVT Prophylaxis: subcutaneous Heparin  Advance goals of care discussion: full code for now.  I have concerned that the patient is deteriorating faster. I will also concerned that the patient may not survive this hospitalization given that there is not significant have changed on his CT scan but the patient is significantly symptomatic. Palliative care consulted for further discussion of goals of care in this patient.  Family Communication: no family was present at bedside, at the time of interview.    Disposition:  Discharge to be determined.  Consultants: Cardiac thoracic surgery, oncology phone consultation Procedures: Echocardiogram   Scheduled Meds: . amLODipine  5 mg Oral Daily  . azithromycin  500 mg Oral Daily  . ipratropium-albuterol  3 mL Nebulization BID  . methylPREDNISolone (SOLU-MEDROL) injection  60 mg Intravenous Daily  . polyethylene glycol  17 g Oral Daily  . senna-docusate  1 tablet Oral BID  . sodium chloride flush  3 mL Intravenous Q12H   Continuous  Infusions: . sodium chloride    . heparin 1,250 Units/hr (03/03/18 1437)   PRN Meds: sodium chloride, acetaminophen **OR** acetaminophen, albuterol, HYDROcodone-acetaminophen, HYDROmorphone (DILAUDID) injection, ondansetron **OR** ondansetron (ZOFRAN) IV, sodium chloride flush, zolpidem Antibiotics: Anti-infectives (From admission, onward)   Start     Dose/Rate Route Frequency Ordered Stop   03/01/18 1600  azithromycin (ZITHROMAX) tablet 500 mg    Note to Pharmacy:  Take 2 tablets on day 1, then 1 tablet on days 2-5.     500 mg Oral Daily 03/01/18 1559         Objective: Physical Exam: Vitals:   03/03/18 0345 03/03/18 0700 03/03/18 0857 03/03/18 1112  BP: 117/86 114/68  128/89  Pulse: (!) 104 82  (!) 103  Resp: 20 17  16   Temp: 97.6 F (36.4 C) 97.9 F (36.6 C)  97.6 F (36.4 C)  TempSrc: Oral Oral  Oral  SpO2: (!) 89% 95% 91% 92%  Weight:      Height:        Intake/Output Summary (Last 24 hours) at 03/03/2018 1705 Last data filed at 03/03/2018 1003 Gross per 24 hour  Intake 496.52 ml  Output 975 ml  Net -478.48 ml   Filed Weights   03/01/18 0657  Weight: 69 kg   General: Alert, Awake and Oriented to Time, Place and Person. Appear in moderate distress, affect appropriate Eyes: PERRL, Conjunctiva normal ENT: Oral Mucosa clear moist. Neck: no JVD, no Abnormal Mass Or lumps Cardiovascular: S1 and S2 Present, no Murmur, Peripheral Pulses Present Respiratory: increased respiratory effort, Bilateral Air entry equal and Decreased, no use of accessory muscle, right Crackles, right wheezes Abdomen: Bowel Sound present, Soft and no tenderness, no hernia Skin: no redness, no Rash, no induration Extremities: no Pedal edema, no calf tenderness Neurologic: Grossly no focal neuro deficit. Bilaterally Equal motor strength  Data Reviewed: CBC: Recent Labs  Lab 03/01/18 0701 03/02/18 0308 03/02/18 2140  WBC 6.5 7.6 7.6  HGB 15.6 14.0 14.0  HCT 50.8 44.6 47.4  MCV 88.7  87.3 90.3  PLT 184 179 161   Basic Metabolic Panel: Recent Labs  Lab 03/01/18 0701 03/02/18 0308 03/03/18 0647  NA 139 139 138  K 3.9 3.8 4.5  CL 103 102 102  CO2 26 27 27   GLUCOSE 116* 139* 158*  BUN 6* 13 10  CREATININE 0.71 0.77 0.76  CALCIUM 8.9 9.0 8.9  MG  --   --  2.1    Liver Function Tests: No results for input(s): AST, ALT, ALKPHOS, BILITOT, PROT, ALBUMIN in the last 168 hours. No results for input(s): LIPASE, AMYLASE in the last 168 hours. No results for input(s): AMMONIA in the last 168 hours. Coagulation Profile: Recent Labs  Lab 03/01/18 1628  INR 1.16   Cardiac Enzymes: No results for input(s): CKTOTAL, CKMB, CKMBINDEX, TROPONINI in the last 168 hours. BNP (last 3 results) No results for input(s): PROBNP in the last 8760 hours. CBG: No results for input(s): GLUCAP in the last 168 hours. Studies: No results found.   Time spent: 35 minutes  Author: Berle Mull, MD [facility] Pager: [phone] 03/03/2018 5:05 PM  If 7PM-7AM, please contact night-coverage at [email or web address], password [facility]

## 2018-03-03 NOTE — Progress Notes (Signed)
ANTICOAGULATION CONSULT NOTE - Follow Up Consult  Pharmacy Consult for Heparin Indication: pulmonary embolus  No Known Allergies  Patient Measurements: Height: 5' 9.5" (176.5 cm) Weight: 152 lb 1.9 oz (69 kg) IBW/kg (Calculated) : 71.85 Heparin Dosing Weight: 69  Vital Signs: Temp: 97.6 F (36.4 C) (10/12 1112) Temp Source: Oral (10/12 1112) BP: 128/89 (10/12 1112) Pulse Rate: 103 (10/12 1112)  Labs: Recent Labs    03/01/18 0701 03/01/18 1628 03/02/18 0308 03/02/18 2140 03/03/18 0647 03/03/18 0649 03/03/18 1336  HGB 15.6  --  14.0 14.0  --   --   --   HCT 50.8  --  44.6 47.4  --   --   --   PLT 184  --  179 191  --   --   --   LABPROT  --  14.8  --   --   --   --   --   INR  --  1.16  --   --   --   --   --   HEPARINUNFRC  --   --   --  0.15*  --  0.31 0.27*  CREATININE 0.71  --  0.77  --  0.76  --   --     Estimated Creatinine Clearance: 86.3 mL/min (by C-G formula based on SCr of 0.76 mg/dL).   Medications:  Scheduled:  . amLODipine  5 mg Oral Daily  . azithromycin  500 mg Oral Daily  . ipratropium-albuterol  3 mL Nebulization BID  . methylPREDNISolone (SOLU-MEDROL) injection  60 mg Intravenous Daily  . polyethylene glycol  17 g Oral Daily  . senna-docusate  1 tablet Oral BID  . sodium chloride flush  3 mL Intravenous Q12H    Assessment: 67 year old male presenting to ED with complaints of SOB. Admitted with recurrent right lung pleural effusion, new onset hypoxemia in setting of stage IV non-small cell lung cancer. CT angio today positive for acute PE in left upper load. No evidence of right-heart strain. Pharmacy consulted to start IV heparin infusion. Previous report of occasional hemoptysis noted, MD is aware.   Heparin confirmatory level this afternoon is slightly sub-therapeutic at 0.27 Will increase heparin by 2 unit /kg / hr.  Goal of Therapy:  Heparin level = 0.3 - 0.5 units/mL Monitor platelets by anticoagulation protocol: Yes   Plan:    Increase heparin drip to 1250 units / hr 2030 confirmatory heparin level Target HL 0.3-0.5 units/hr due to hemopytsis Daily heparin level, CBC, monitor for signs and symptoms for bleeding.      Thank you for allowing pharmacy to be a part of this patient's care.  Tamela Gammon, PharmD 03/03/2018 2:27 PM PGY-1 Pharmacy Resident Phone: [phone] Please check [email or web address] for unit-specific pharmacist phone numbers after 3:00 P.M.

## 2018-03-04 ENCOUNTER — Inpatient Hospital Stay (HOSPITAL_COMMUNITY): Payer: Medicare Other

## 2018-03-04 DIAGNOSIS — I428 Other cardiomyopathies: Secondary | ICD-10-CM

## 2018-03-04 LAB — BASIC METABOLIC PANEL
Anion gap: 9 (ref 5–15)
BUN: 11 mg/dL (ref 8–23)
CALCIUM: 9 mg/dL (ref 8.9–10.3)
CO2: 28 mmol/L (ref 22–32)
CREATININE: 0.72 mg/dL (ref 0.61–1.24)
Chloride: 101 mmol/L (ref 98–111)
GFR calc Af Amer: >60 mL/min (ref >60)
GFR calc non Af Amer: >60 mL/min (ref >60)
Glucose, Bld: 127 mg/dL — ABNORMAL HIGH (ref 70–99)
Potassium: 4.1 mmol/L (ref 3.5–5.1)
SODIUM: 138 mmol/L (ref 135–145)

## 2018-03-04 LAB — CBC
HCT: 46.9 % (ref 39.0–52.0)
Hemoglobin: 14.3 g/dL (ref 13.0–17.0)
MCH: 27.2 pg (ref 26.0–34.0)
MCHC: 30.5 g/dL (ref 30.0–36.0)
MCV: 89.3 fL (ref 80.0–100.0)
PLATELETS: 203 10*3/uL (ref 150–400)
RBC: 5.25 MIL/uL (ref 4.22–5.81)
RDW: 16.2 % — ABNORMAL HIGH (ref 11.5–15.5)
WBC: 9.3 10*3/uL (ref 4.0–10.5)
nRBC: 0 % (ref 0.0–0.2)

## 2018-03-04 LAB — HEPARIN LEVEL (UNFRACTIONATED)
HEPARIN UNFRACTIONATED: 0.28 [IU]/mL — AB (ref 0.30–0.70)
HEPARIN UNFRACTIONATED: 0.44 [IU]/mL (ref 0.30–0.70)

## 2018-03-04 MED ORDER — IOHEXOL 300 MG/ML  SOLN
75.0000 mL | Freq: Once | INTRAMUSCULAR | Status: AC | PRN
  Administered 2018-03-04: 75 mL via INTRAVENOUS

## 2018-03-04 MED ORDER — CARVEDILOL 3.125 MG PO TABS
3.1250 mg | ORAL_TABLET | Freq: Two times a day (BID) | ORAL | Status: DC
  Administered 2018-03-05 – 2018-03-06 (×3): 3.125 mg via ORAL
  Filled 2018-03-04 (×3): qty 1

## 2018-03-04 MED ORDER — DEXAMETHASONE SODIUM PHOSPHATE 10 MG/ML IJ SOLN
4.0000 mg | INTRAMUSCULAR | Status: DC
  Administered 2018-03-04 – 2018-03-05 (×2): 4 mg via INTRAVENOUS
  Filled 2018-03-04 (×2): qty 1

## 2018-03-04 MED ORDER — APIXABAN 5 MG PO TABS: 5.0000 mg | ORAL_TABLET | Freq: Two times a day (BID) | ORAL | Status: DC

## 2018-03-04 MED ORDER — APIXABAN 5 MG PO TABS
10.0000 mg | ORAL_TABLET | Freq: Two times a day (BID) | ORAL | Status: DC
  Administered 2018-03-04 – 2018-03-06 (×4): 10 mg via ORAL
  Filled 2018-03-04 (×4): qty 2

## 2018-03-04 MED ORDER — LOSARTAN POTASSIUM 25 MG PO TABS
25.0000 mg | ORAL_TABLET | Freq: Every day | ORAL | Status: DC
  Administered 2018-03-04 – 2018-03-06 (×3): 25 mg via ORAL
  Filled 2018-03-04 (×3): qty 1

## 2018-03-04 NOTE — Consult Note (Addendum)
CARDIOLOGY CONSULT NOTE  Patient ID: Bradley Maldonado, MRN: [id number], DOB/AGE: Apr 17, 1951 67 y.o. Admit date: 03/01/2018 Date of Consult: 03/04/2018  Primary Physician: [name], NP-C Primary Cardiologist: Bradley Maldonado is a 67 y.o. male who is being seen today for the evaluation of lv dysfunction at the request of Dr Posey Pronto.    Chief Complaint: LV EF 20%    HPI Bradley Maldonado is a 67 y.o. male with stage IV adenocarcinoma of the lung on immunotherapy who was admitted with 3-6 weeks of progressive dyspnea on exertion, some peripheral edema that is asymmetric, orthopnea and nocturnal dyspnea.  Evaluation included a CT demonstrated no pulmonary embolism he is on anticoagulation.  Echocardiogram demonstrated LV dysfunction 25-30% with moderate LVH. This was an unexpected finding.  He lives by himself and has had progressive problems over the last few months taking care of life, i.e. grocery shopping cooking etc. Etc.   Past Medical History:  Diagnosis Date  . Adenocarcinoma of right lung, stage 4 ([place]) 01/24/2017  . Goals of care, counseling/discussion 01/24/2017  . Hypertension       Surgical History:  Past Surgical History:  Procedure Laterality Date  . CHEST TUBE INSERTION Right 02/06/2017   Procedure: INSERTION PLEURAL DRAINAGE CATHETER;  Surgeon: Ivin Poot, MD;  Location: [place];  Service: Thoracic;  Laterality: Right;  . EYE SURGERY Right    metal removed from eye  . Hip replacement    . IR THORACENTESIS ASP PLEURAL SPACE W/IMG GUIDE  12/19/2016  . REMOVAL OF PLEURAL DRAINAGE CATHETER Right 03/21/2017   Procedure: REMOVAL OF RIGHT PLEURAL DRAINAGE CATHETER;  Surgeon: Ivin Poot, MD;  Location: [place];  Service: Thoracic;  Laterality: Right;     Home Meds: Prior to Admission medications   Medication Sig Start Date End Date Taking? Authorizing Provider  amLODipine (NORVASC) 5 MG tablet Take 1 tablet (5 mg total) by mouth daily. 02/28/18   Yes Henson, Vickie L, NP-C  azithromycin (ZITHROMAX) 250 MG tablet Take 2 tablets on day 1, then 1 tablet on days 2-5. Patient not taking: Reported on 03/01/2018 02/08/18   [name], NP-C    Inpatient Medications:  . amLODipine  5 mg Oral Daily  . azithromycin  500 mg Oral Daily  . ipratropium-albuterol  3 mL Nebulization BID  . methylPREDNISolone (SOLU-MEDROL) injection  60 mg Intravenous Daily  . polyethylene glycol  17 g Oral Daily  . senna-docusate  1 tablet Oral BID  . sodium chloride flush  3 mL Intravenous Q12H    Allergies: No Known Allergies  Social History   Socioeconomic History  . Marital status: Divorced    Spouse name: Not on file  . Number of children: Not on file  . Years of education: Not on file  . Highest education level: Not on file  Occupational History  . Not on file  Social Needs  . Financial resource strain: Not on file  . Food insecurity:    Worry: Not on file    Inability: Not on file  . Transportation needs:    Medical: Not on file    Non-medical: Not on file  Tobacco Use  . Smoking status: Former Smoker    Last attempt to quit: 01/21/2018    Years since quitting: 0.1  . Smokeless tobacco: Never Used  Substance and Sexual Activity  . Alcohol use: Yes    Alcohol/week: 14.0 standard drinks    Types: 14 Cans  of beer per week    Comment: occasionally  now since cancer diagnosis  . Drug use: Yes    Types: Marijuana, Cocaine    Comment: using cocaine 1-2 times per week  . Sexual activity: Not Currently  Lifestyle  . Physical activity:    Days per week: Not on file    Minutes per session: Not on file  . Stress: Not on file  Relationships  . Social connections:    Talks on phone: Not on file    Gets together: Not on file    Attends religious service: Not on file    Active member of club or organization: Not on file    Attends meetings of clubs or organizations: Not on file    Relationship status: Not on file  . Intimate partner  violence:    Fear of current or ex partner: Not on file    Emotionally abused: Not on file    Physically abused: Not on file    Forced sexual activity: Not on file  Other Topics Concern  . Not on file  Social History Narrative   [facility] (12/19/16):   No pets currently. No mold exposure. No recent travel.     Family History  Problem Relation Age of Onset  . Diabetes Mother   . Lung cancer Father   . Cancer Father   . Cancer - Lung Father      ROS:  Please see the history of present illness.     All other systems reviewed and negative.    Physical Exam: Blood pressure 119/78, pulse 88, temperature 97.8 F (36.6 C), temperature source Oral, resp. rate 16, height 5' 9.5" (1.765 m), weight 69 kg, SpO2 90 %. General: Well developed, well nourished male in no acute distress. Head: Normocephalic, atraumatic, sclera non-icteric, no xanthomas, nares are without discharge. EENT: normal  Lymph Nodes:  none Neck: Negative for carotid bruits. JVD 8-10 Back:without scoliosis kyphosis Lungs: Clear bilaterally to auscultation without wheezes, rales, or rhonchi. Breathing is unlabored. Heart: RRR with S1 S2. NO murmur . No rubs, or gallops appreciated. Abdomen: Soft, non-tender, non-distended with normoactive bowel sounds. No hepatomegaly. No rebound/guarding. No obvious abdominal masses. Msk:  Strength and tone appear normal for age. Extremities: No clubbing or cyanosis. TR edema.  Distal pedal pulses are 2+ and equal bilaterally. Skin: Warm and Dry Neuro: Alert and oriented X 3. CN III-XII intact Grossly normal sensory and motor function . Psych:  Responds to questions appropriately with a normal affect.      Labs: Chemistry Recent Labs  Lab 03/02/18 0308 03/03/18 0647 03/04/18 0347  NA 139 138 138  K 3.8 4.5 4.1  CL 102 102 101  CO2 27 27 28   GLUCOSE 139* 158* 127*  BUN 13 10 11   CREATININE 0.77 0.76 0.72  CALCIUM 9.0 8.9 9.0  GFRNONAA >60 >60 >60  GFRAA >60 >60  >60  ANIONGAP 10 9 9      Hematology Recent Labs  Lab 03/02/18 0308 03/02/18 2140 03/04/18 0347  WBC 7.6 7.6 9.3  RBC 5.11 5.25 5.25  HGB 14.0 14.0 14.3  HCT 44.6 47.4 46.9  MCV 87.3 90.3 89.3  MCH 27.4 26.7 27.2  MCHC 31.4 29.5* 30.5  RDW 16.0* 16.3* 16.2*  PLT 179 191 203    Cardiac EnzymesNo results for input(s): TROPONINI in the last 168 hours.  Recent Labs  Lab 03/01/18 0704  TROPIPOC 0.00     BNP Recent Labs  Lab 03/01/18 0701  BNP 33.9     DDimer No results for input(s): DDIMER in the last 168 hours.   No results found for: CHOL, HDL, LDLCALC, TRIG  Thyroid Function Tests: No results for input(s): TSH, T4TOTAL, T3FREE, THYROIDAB in the last 72 hours.  Invalid input(s): FREET3 Miscellaneous No results found for: DDIMER  Radiology/Studies:  X-ray Chest Pa And Lateral  Result Date: 03/02/2018 CLINICAL DATA:  Pleural effusion, cough, shortness of breath, history RIGHT lung cancer EXAM: CHEST - 2 VIEW COMPARISON:  03/01/2018 FINDINGS: Normal heart size, mediastinal contours, and pulmonary vascularity. Chronic interstitial prominence in the LEFT lung. Persistent RIGHT pleural effusion and basilar atelectasis, patient with known tumor in inferior RIGHT hemithorax. Diffuse infiltrate throughout remaining aerated RIGHT lung; this could represent infection or lymphangitic tumor spread. No discrete LEFT lung mass or pneumothorax. Bones demineralized. IMPRESSION: Persistent opacification of the inferior half of the RIGHT hemithorax by combination of pleural effusion, atelectasis and known tumor. Persistent chronic infiltrate in residual aerated RIGHT lung with scattered interstitial infiltrates in the LEFT lung, could represent infection though lymphangitic tumor spread could cause a similar pattern. Electronically Signed   By: Lavonia Dana M.D.   On: 03/02/2018 10:44   Ct Angio Chest Pe W Or Wo Contrast  Result Date: 03/02/2018 CLINICAL DATA:  New onset hypoxemia.  History of metastatic non-small cell lung cancer. EXAM: CT ANGIOGRAPHY CHEST WITH CONTRAST TECHNIQUE: Multidetector CT imaging of the chest was performed using the standard protocol during bolus administration of intravenous contrast. Multiplanar CT image reconstructions and MIPs were obtained to evaluate the vascular anatomy. CONTRAST:  162mL ISOVUE-370 IOPAMIDOL (ISOVUE-370) INJECTION 76% COMPARISON:  CT chest dated January 31, 2018. FINDINGS: Cardiovascular: New acute lobar and segmental pulmonary emboli involving the left upper lobe and anterior segment. Emboli in the left upper lobe anterior segment are occlusive. Nonocclusive emboli in subsegmental superior lingular branches. No left lower lobe or right-sided pulmonary emboli. Normal RV/LV ratio of 0.84. Normal heart size. No pericardial effusion. Normal caliber thoracic aorta. Coronary, aortic arch, and branch vessel atherosclerotic vascular disease. Mediastinum/Nodes: Unchanged mild right axillary lymphadenopathy measuring up to 1.1 cm. No left axillary lymphadenopathy. Unchanged enlarged 1.0 cm right cardiophrenic lymph node. Previously seen enlarged aortopulmonary window, subcarinal, and right hilar lymph nodes are not as well visualized due to phase of contrast, but appear grossly unchanged. The thyroid gland, trachea, and esophagus demonstrate no significant findings. Lungs/Pleura: Stable volume loss in the right hemithorax. Unchanged irregular prominent right-sided pleural thickening and hyperenhancement with small loculated dependent and subpulmonic right pleural effusion. Unchanged interlobular septal thickening throughout the right lung. Stable consolidation throughout much of the right middle and right lower lobes. Numerous pulmonary nodules scattered throughout the left lung are stable to slightly increased in size. For example a posterior left upper lobe nodule now measures 9 mm, previously 7 mm. Severe centrilobular emphysema and mild diffuse  bronchial wall thickening again noted. No pneumothorax. Upper Abdomen: No acute abnormality. Musculoskeletal: Unchanged small lytic metastases involving the right posterolateral eighth rib and right posterior levin through. Review of the MIP images confirms the above findings. IMPRESSION: 1. New acute lobar and segmental pulmonary emboli involving the left upper lobe. Emboli in the left upper lobe anterior segment are occlusive. No evidence of right heart strain. 2. Unchanged extensive circumferential tumor in the right pleural space. Unchanged lymphangitic carcinomatosis and consolidative tumor/atelectasis throughout the right lung. Unchanged loculated subpulmonic right pleural effusion. 3. Stable to mildly increased left lung metastases. 4. Stable mediastinal, right hilar, and right  axillary lymphadenopathy. 5.  Emphysema (ICD10-J43.9). 6.  Aortic atherosclerosis (ICD10-I70.0). Critical Value/emergent results were called by telephone at the time of interpretation on 03/02/2018 at 1:47 pm to Dr. Berle Mull , who verbally acknowledged these results. Electronically Signed   By: Titus Dubin M.D.   On: 03/02/2018 13:47   Dg Chest Portable 1 View  Result Date: 03/01/2018 CLINICAL DATA:  Worsening shortness of breath for 2 days, history lung cancer, hypertension EXAM: PORTABLE CHEST 1 VIEW COMPARISON:  Portable exam 0707 hours compared to CT chest of 01/31/2018 FINDINGS: Subtotal opacification of the RIGHT hemithorax by combination of pleural effusion and tumor as well as RIGHT lung atelectasis. Mediastinal shift to RIGHT. Normal heart size and pulmonary vascularity. Scattered chronic accentuation of interstitial markings in the LEFT lung. The discrete pulmonary metastases seen in the LEFT lung on the prior CT exam are less well delineated radiographically. No segmental infiltrate or pleural effusion in the LEFT lung. No pneumothorax. Bones demineralized. IMPRESSION: Chronic subtotal opacification of RIGHT  hemithorax by known tumor, effusion, and atelectasis. Chronic accentuation of interstitial markings in LEFT lung without superimposed acute infiltrate. Poorly visualized LEFT lung metastases as noted on prior CT. Electronically Signed   By: Lavonia Dana M.D.   On: 03/01/2018 07:41   Ir US Chest  Result Date: 03/01/2018 CLINICAL DATA:  67 year old male with a history of right-sided lung cancer nearly completely replacing the right lung with central necrosis at the lung base. He presents with increased shortness of breath. Evaluate for pleural fluid for possible thoracentesis. EXAM: CHEST ULTRASOUND COMPARISON:  Most recent prior CT scan of the chest 01/31/2018 FINDINGS: No free pleural fluid. There is fluid within the centrally necrotic right lower lobe, however this is surrounded by a thick rind of tumor tissue and would not result in any further aeration of the lung. Thoracentesis was not performed. IMPRESSION: Negative for free pleural fluid.  Thoracentesis was not performed. There is fluid within the centrally necrotic right lower lobe pulmonary mass, however this is surrounded by a thick rind of tumor and is not amenable to thoracentesis. Electronically Signed   By: Jacqulynn Cadet M.D.   On: 03/01/2018 16:15    EKG: sinus w narrow qrs    Assessment and Plan:  Cardiomyopathy question mechanism  Pulmonary embolism-acute  Adenocarcinoma the lung stage IV  Congestive heart failure?  Contribution  Pneumonitis/persistent opacification bilaterally  Social situation-challenge   The patient presents with worsening shortness of breath.  He has some suggestions of left ventricular failure with orthopnea nocturnal dyspnea.  His BNP is normal.  He has other reasons for his dyspnea including new pulmonary embolism and opacifications bilaterally.  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  This is his first echo so we  have no idea whether his cardiomyopathy antedates his immunotherapy or not.  In any case, afterload reduction may help reversal.  The one case report I saw outlined normalization of LV function with withdrawal of drug and afterload reduction.  Obviously the issue of drug withdrawal is more complicated given his cancer will have to be deferred to Dr. Earlie Server.  For now, we will stop his amlodipine and begin a low-dose ARB and beta-blockers.  Given his poor prognosis, I do not think that catheterization is indicated hence, I would be in favor of transitioning him to long-term anticoagulation.  Choice will be deferred to oncology.  We discussed the possibility of home services being available to him.  [name] currently not interested in hospice as he "I want to live. I have things to do." We talked about the hard reality of living life well until we have no more life to live.  Virl Axe

## 2018-03-04 NOTE — Progress Notes (Signed)
ANTICOAGULATION CONSULT NOTE - Follow Up Consult  Pharmacy Consult for Heparin Indication: pulmonary embolus  No Known Allergies  Patient Measurements: Height: 5' 9.5" (176.5 cm) Weight: 152 lb 1.9 oz (69 kg) IBW/kg (Calculated) : 71.85 Heparin Dosing Weight: 69  Vital Signs: Temp: 97.6 F (36.4 C) (10/13 0726) Temp Source: Oral (10/13 0726) BP: 117/80 (10/13 0726) Pulse Rate: 85 (10/13 0726)  Labs: Recent Labs    03/01/18 1628  03/02/18 0308 03/02/18 2140 03/03/18 0647  03/03/18 1336 03/03/18 2012 03/04/18 0347  HGB  --    < > 14.0 14.0  --   --   --   --  14.3  HCT  --   --  44.6 47.4  --   --   --   --  46.9  PLT  --   --  179 191  --   --   --   --  203  LABPROT 14.8  --   --   --   --   --   --   --   --   INR 1.16  --   --   --   --   --   --   --   --   HEPARINUNFRC  --   --   --  0.15*  --    < > 0.27* 0.34 0.28*  CREATININE  --   --  0.77  --  0.76  --   --   --  0.72   < > = values in this interval not displayed.    Estimated Creatinine Clearance: 86.3 mL/min (by C-G formula based on SCr of 0.72 mg/dL).   Medications:  Scheduled:  . amLODipine  5 mg Oral Daily  . azithromycin  500 mg Oral Daily  . ipratropium-albuterol  3 mL Nebulization BID  . methylPREDNISolone (SOLU-MEDROL) injection  60 mg Intravenous Daily  . polyethylene glycol  17 g Oral Daily  . senna-docusate  1 tablet Oral BID  . sodium chloride flush  3 mL Intravenous Q12H    Assessment: 67 year old male presenting to ED with complaints of SOB. Admitted with recurrent right lung pleural effusion, new onset hypoxemia in setting of stage IV non-small cell lung cancer.  CT angio on 10/11 positive for acute PE in left upper load. No evidence of right-heart strain. Pharmacy consulted to start IV heparin infusion. Previous report of occasional hemoptysis noted, MD is aware.   Heparin level currently subtherapeutic this morning at 0.28. Hgb 14.3 and stable; platelets 203 and stable. Will  increase slightly this morning by 2 units / kg / hr.  Confirmed with nurse no line issues, no bleeding.   Goal of Therapy:  Heparin level = 0.3 - 0.5 units/mL Monitor platelets by anticoagulation protocol: Yes   Plan:  Increase heparin drip to 1350 units / hr Confirmatory heparin level in 6 hours at 1400 Target HL 0.3-0.5 units/hr due to hemopytsis  Daily heparin level, CBC, monitor for signs and symptoms for bleeding.     Thank you for allowing pharmacy to be a part of this patient's care.  Tamela Gammon, PharmD 03/04/2018 7:39 AM PGY-1 Pharmacy Resident Phone: [phone] Please check [email or web address] for unit-specific pharmacist phone numbers after 3:00 P.M.

## 2018-03-04 NOTE — Progress Notes (Signed)
ANTICOAGULATION CONSULT NOTE - Follow Up Consult  Pharmacy Consult for Apixaban Indication: pulmonary embolus  No Known Allergies  Patient Measurements: Height: 5' 9.5" (176.5 cm) Weight: 152 lb 1.9 oz (69 kg) IBW/kg (Calculated) : 71.85 Heparin Dosing Weight: 69  Vital Signs: Temp: 97.6 F (36.4 C) (10/13 1707) Temp Source: Oral (10/13 1707) BP: 126/80 (10/13 1707) Pulse Rate: 93 (10/13 1707)  Labs: Recent Labs    03/02/18 0308 03/02/18 2140 03/03/18 0647  03/03/18 2012 03/04/18 0347 03/04/18 1432  HGB 14.0 14.0  --   --   --  14.3  --   HCT 44.6 47.4  --   --   --  46.9  --   PLT 179 191  --   --   --  203  --   HEPARINUNFRC  --  0.15*  --    < > 0.34 0.28* 0.44  CREATININE 0.77  --  0.76  --   --  0.72  --    < > = values in this interval not displayed.    Estimated Creatinine Clearance: 86.3 mL/min (by C-G formula based on SCr of 0.72 mg/dL).   Medications:  Scheduled:  . azithromycin  500 mg Oral Daily  . [START ON 03/05/2018] carvedilol  3.125 mg Oral BID WC  . ipratropium-albuterol  3 mL Nebulization BID  . losartan  25 mg Oral Daily  . methylPREDNISolone (SOLU-MEDROL) injection  60 mg Intravenous Daily  . polyethylene glycol  17 g Oral Daily  . senna-docusate  1 tablet Oral BID  . sodium chloride flush  3 mL Intravenous Q12H   Assessment: 75 yoM presenting with SOB. Admitted with recurrent right lung pleural effusion, new onset hypoxemia in setting of stage IV non-small cell lung cancer. CT angio on 10/11 positive for acute PE. No evidence of right-heart strain. Patient on IV heparin. Previous report of occasional hemoptysis noted, MD is aware. MD now wishes pharmacy to dose apixaban.   Goal of Therapy:  Monitor platelets by anticoagulation protocol: Yes   Plan:  Apixaban 10mg  PO BID x 7 days and then 5mg  BID thereafter. D/C heparin drip once first dose of apixaban given Monitor for s/sx of bleeding   Izmael Duross A. Levada Dy, PharmD, [place] Pager: [phone] Please utilize Amion for appropriate phone number to reach the unit pharmacist ([place])

## 2018-03-04 NOTE — Progress Notes (Signed)
[facility] Progress Note  Patient: Bradley Maldonado ZDG:[id number]   PCP: [name], NP-C DOB: Sep 23, 1950   DOA: 03/01/2018   DOS: 03/04/2018   Date of Service: the patient was seen and examined on 03/04/2018  Brief hospital course: Pt. with PMH of stage IV non-small cell lung cancer with malignant effusion on the right, COPD, HTN; admitted on 03/01/2018, presented with complaint of shortness of breath, was found to have acute hypoxic respiratory failure, acute left pulmonary embolism. Currently further plan is continue current care.  Subjective: Patient tells me that he is feeling better but still has thick sputum.  On 4 L of oxygen.  No chest pain this morning.  No diarrhea.  No blood in the stool.  Occasional blood still present in the sputum.  Assessment and Plan: 1.  Acute hypoxic respiratory failure. Malignant pleural effusion Acute left pulmonary embolism Hemoptysis  Known history of COPD as well as right-sided lung cancer but now started to develop 1 week of shortness of breath some hemoptysis orthopnea and PND. Referring to laying on the right side. Currently requiring 6 L of oxygen from low oxygen at home. Chest x-ray shows opacification of the right lung.  And there was some concern for pleural effusion. Cardiothoracic was consulted for Pleurx catheter placement but they felt that the patient will not get any benefit out of the Pleurx catheter. IR was consulted for thoracentesis although ultrasound was negative for any significant fluid that they can drain. Patient does have large fluid collection with thick necrotic rim around it consistent with his malignancy. CT chest PE protocol was performed which is positive for left upper lobe obstructive emboli and left middle lobe emboli as well these are not large enough to explain the degree of the hypoxia and the patient has. There is a possibility that the patient may have pneumonitis from him being on Keytruda and I  will start the patient on 1 mg/kg steroids.  This was discussed with on-call oncology. Continue oral azithromycin for now Although no evidence of new consolidation and a CT scan of the chest. Continue pain control with Dilaudid and oral narcotics. We started the patient on IV heparin after discussion.  Now that he is tolerating it very well we will transition him to oral Eliquis. Given his history of hemoptysis patient received lower bolus. Pharmacy consulted.  2.  History of COPD. Continue nebulizer.  Added steroids.  Patient does not have any significant bilateral wheezing concerning for active COPD exacerbation.  3.  Essential hypertension. Patient is on amlodipine 5 mg daily. Blood pressure was significantly elevated initially but now appears to have well controlled. Monitor closely a low threshold to discontinue amlodipine.  4.  Dyspnea with hypoxia. Chronic systolic CHF.  New diagnosis. I do appreciate cardiomegaly on the CT scan. Echocardiogram was done which shows 20 to 25% EF.  No pericardial effusion.  Appreciate cardiology input.  Medications adjusted.  No plan for ischemic work-up.  5.  Metastatic non small cell cancer. Exophthalmos. Prior to transitioning the patient to Eliquis CT of the head with and without contrast as well as CT of the orbit were performed which were requested by ophthalmology. No acute abnormality in the orbit were identified that requires a surgical correction and therefore patient was transitioned to oral Eliquis. On the other hand CT head is positive for multiple metastatic lesions. Will need radiation oncology consultation for possible radiation as well as will formally consult oncology for consultation. Already on IV Solu-Medrol  for vasogenic edema seen on the CT scan.  Transition to Decadron today.  Diet: Cardiac diet DVT Prophylaxis: subcutaneous Heparin  Advance goals of care discussion: full code for now.  I have concerned that the patient  is deteriorating faster. I will also concerned that the patient may not survive this hospitalization given that there is not significant have changed on his CT scan but the patient is significantly symptomatic. Palliative care consulted for further discussion of goals of care in this patient.  Family Communication: no family was present at bedside, at the time of interview.    Disposition:  Discharge to be determined.  Consultants: Cardiac thoracic surgery, oncology phone consultation Procedures: Echocardiogram   Scheduled Meds: . apixaban  10 mg Oral Q12H   Followed by  . [START ON 03/11/2018] apixaban  5 mg Oral BID  . azithromycin  500 mg Oral Daily  . [START ON 03/05/2018] carvedilol  3.125 mg Oral BID WC  . ipratropium-albuterol  3 mL Nebulization BID  . losartan  25 mg Oral Daily  . methylPREDNISolone (SOLU-MEDROL) injection  60 mg Intravenous Daily  . polyethylene glycol  17 g Oral Daily  . senna-docusate  1 tablet Oral BID  . sodium chloride flush  3 mL Intravenous Q12H   Continuous Infusions: . sodium chloride     PRN Meds: sodium chloride, acetaminophen **OR** acetaminophen, albuterol, HYDROcodone-acetaminophen, HYDROmorphone (DILAUDID) injection, ondansetron **OR** ondansetron (ZOFRAN) IV, sodium chloride flush, zolpidem Antibiotics: Anti-infectives (From admission, onward)   Start     Dose/Rate Route Frequency Ordered Stop   03/01/18 1600  azithromycin (ZITHROMAX) tablet 500 mg    Note to Pharmacy:  Take 2 tablets on day 1, then 1 tablet on days 2-5.     500 mg Oral Daily 03/01/18 1559         Objective: Physical Exam: Vitals:   03/04/18 0726 03/04/18 0904 03/04/18 1133 03/04/18 1707  BP: 117/80  119/78 126/80  Pulse: 85  88 93  Resp: 16  16 18   Temp: 97.6 F (36.4 C)  97.8 F (36.6 C) 97.6 F (36.4 C)  TempSrc: Oral  Oral Oral  SpO2: 94% 95% 90% 99%  Weight:      Height:       No intake or output data in the 24 hours ending 03/04/18 1802 Filed  Weights   03/01/18 0657  Weight: 69 kg   General: Alert, Awake and Oriented to Time, Place and Person. Appear in moderate distress, affect appropriate Eyes: PERRL, Conjunctiva normal ENT: Oral Mucosa clear moist. Neck: no JVD, no Abnormal Mass Or lumps Cardiovascular: S1 and S2 Present, no Murmur, Peripheral Pulses Present Respiratory: increased respiratory effort, Bilateral Air entry equal and Decreased, no use of accessory muscle, right Crackles, right wheezes Abdomen: Bowel Sound present, Soft and no tenderness, no hernia Skin: no redness, no Rash, no induration Extremities: no Pedal edema, no calf tenderness Neurologic: Grossly no focal neuro deficit. Bilaterally Equal motor strength  Data Reviewed: CBC: Recent Labs  Lab 03/01/18 0701 03/02/18 0308 03/02/18 2140 03/04/18 0347  WBC 6.5 7.6 7.6 9.3  HGB 15.6 14.0 14.0 14.3  HCT 50.8 44.6 47.4 46.9  MCV 88.7 87.3 90.3 89.3  PLT 184 179 191 628   Basic Metabolic Panel: Recent Labs  Lab 03/01/18 0701 03/02/18 0308 03/03/18 0647 03/04/18 0347  NA 139 139 138 138  K 3.9 3.8 4.5 4.1  CL 103 102 102 101  CO2 26 27 27 28   GLUCOSE 116* 139* 158* 127*  BUN 6* 13 10 11   CREATININE 0.71 0.77 0.76 0.72  CALCIUM 8.9 9.0 8.9 9.0  MG  --   --  2.1  --     Liver Function Tests: No results for input(s): AST, ALT, ALKPHOS, BILITOT, PROT, ALBUMIN in the last 168 hours. No results for input(s): LIPASE, AMYLASE in the last 168 hours. No results for input(s): AMMONIA in the last 168 hours. Coagulation Profile: Recent Labs  Lab 03/01/18 1628  INR 1.16   Cardiac Enzymes: No results for input(s): CKTOTAL, CKMB, CKMBINDEX, TROPONINI in the last 168 hours. BNP (last 3 results) No results for input(s): PROBNP in the last 8760 hours. CBG: No results for input(s): GLUCAP in the last 168 hours. Studies: Ct Head W & Wo Contrast  Result Date: 03/04/2018 CLINICAL DATA:  Exophthalmos.  Small cell lung cancer. EXAM: CT HEAD AND ORBITS  WITHOUT AND WITH CONTRAST TECHNIQUE: Contiguous axial images were obtained from the base of the skull through the vertex with and without contrast. Multidetector CT imaging of the orbits was performed using the standard protocol with and without intravenous contrast. CONTRAST:  24mL OMNIPAQUE IOHEXOL 300 MG/ML  SOLN COMPARISON:  MR head 12/20/2016. FINDINGS: CT HEAD FINDINGS Brain: No acute stroke or hemorrhage. There are multiple intracranial mass lesions consistent with metastases, which demonstrate homogeneous postcontrast enhancement. The largest measures 8 mm, LEFT periventricular location. BILATERAL frontal, parietal, and occipital metastases are observed. None are seen in the cerebellum or brainstem. Vasogenic edema is greatest in the LEFT occipital lobe. Vascular: Visible vessels are patent. Vascular calcification consistent with intracranial atherosclerotic disease. Skull: No calvarial destructive lesions. Other: None. CT ORBITS FINDINGS Orbits: No traumatic or inflammatory finding. Globes, optic nerves, orbital fat, extraocular muscles, vascular structures, and lacrimal glands are normal. Symmetric proptosis, uncertain significance. Doubt increased intracranial pressure. Visualized sinuses: Clear. Soft tissues: Unremarkable. IMPRESSION: CT head without and with contrast demonstrates BILATERAL subcentimeter supratentorial intra-axial metastases, consistent with the pattern frequently seen in small cell lung cancer. No midline shift, but asymmetric vasogenic edema is most pronounced in the LEFT occipital lobe. Detection of all metastases is more readily accomplished using pre and postcontrast MRI of the brain, a modality which displays increased sensitivity to smaller metastatic deposits. No osseous metastatic disease. 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 Significance uncertain. A call has been placed to the ordering provider. Electronically Signed   By: Staci Righter M.D.   On: 03/04/2018 17:20     Ct Orbits W Contrast  Result Date: 03/04/2018 CLINICAL DATA:  Exophthalmos.  Small cell lung cancer. EXAM: CT HEAD AND ORBITS WITHOUT AND WITH CONTRAST TECHNIQUE: Contiguous axial images were obtained from the base of the skull through the vertex with and without contrast. Multidetector CT imaging of the orbits was performed using the standard protocol with and without intravenous contrast. CONTRAST:  10mL OMNIPAQUE IOHEXOL 300 MG/ML  SOLN COMPARISON:  MR head 12/20/2016. FINDINGS: CT HEAD FINDINGS Brain: No acute stroke or hemorrhage. There are multiple intracranial mass lesions consistent with metastases, which demonstrate homogeneous postcontrast enhancement. The largest measures 8 mm, LEFT periventricular location. BILATERAL frontal, parietal, and occipital metastases are observed. None are seen in the cerebellum or brainstem. Vasogenic edema is greatest in the LEFT occipital lobe. Vascular: Visible vessels are patent. Vascular calcification consistent with intracranial atherosclerotic disease. Skull: No calvarial destructive lesions. Other: None. CT ORBITS FINDINGS Orbits: No traumatic or inflammatory finding. Globes, optic nerves, orbital fat, extraocular muscles, vascular structures, and lacrimal glands are normal. Symmetric proptosis, uncertain significance.  Doubt increased intracranial pressure. Visualized sinuses: Clear. Soft tissues: Unremarkable. IMPRESSION: CT head without and with contrast demonstrates BILATERAL subcentimeter supratentorial intra-axial metastases, consistent with the pattern frequently seen in small cell lung cancer. No midline shift, but asymmetric vasogenic edema is most pronounced in the LEFT occipital lobe. Detection of all metastases is more readily accomplished using pre and postcontrast MRI of the brain, a modality which displays increased sensitivity to smaller metastatic deposits. No osseous metastatic disease. 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  Significance uncertain. A call has been placed to the ordering provider. Electronically Signed   By: Staci Righter M.D.   On: 03/04/2018 17:20     Time spent: 35 minutes  Author: Berle Mull, MD [facility] Pager: [phone] 03/04/2018 6:02 PM  If 7PM-7AM, please contact night-coverage at [email or web address], password [facility]

## 2018-03-04 NOTE — Evaluation (Signed)
Physical Therapy Evaluation and Discharge Patient Details Name: Bradley Maldonado MRN: [id number] DOB: 16-Sep-1950 Today's Date: 03/04/2018   History of Present Illness  67 y.o. male, with past medical history significant for stage IV non-small cell lung cancer on the right with malignant effusion and history of COPD presenting with 1 week history of increasing shortness of breath, was found to have acute hypoxic respiratory failure, acute left pulmonary embolism.    Clinical Impression  Patient evaluated by Physical Therapy with no further acute PT needs identified. All education has been completed and the patient has no further questions. During PT unable to walk more than 30 ft due to lack of portable O2 tank on the unit. During limited ambulation his SaO2 on 5L down to 88%; seated on room air down to 88%. **Pt reports he has a portable O2 tank at home but NOT an oxygen concentrator. Anticipate he wil need home O2. He did manage O2 tubing (~15 feet of tubing) independently while walking in room.  PT is signing off. Thank you for this referral.     Follow Up Recommendations No PT follow up;Supervision - Intermittent(for community needs if using portable O2)    Equipment Recommendations  None recommended by PT    Recommendations for Other Services       Precautions / Restrictions Precautions Precautions: Other (comment) Precaution Comments: monitor O2      Mobility  Bed Mobility Overal bed mobility: Independent                Transfers Overall transfer level: Independent Equipment used: None                Ambulation/Gait Ambulation/Gait assistance: Modified independent (Device/Increase time) Gait Distance (Feet): 30 Feet Assistive device: None Gait Pattern/deviations: Step-through pattern;Decreased stride length(primarily limited by IV pole and monitor stand)     General Gait Details: limited to in room distance due to NO portable O2 tanks on unit;    Stairs            Wheelchair Mobility    Modified Rankin (Stroke Patients Only)       Balance Overall balance assessment: Independent(stands Romberg no difficulty; eyes closed no difficulty)                                           Pertinent Vitals/Pain Pain Assessment: No/denies pain    Home Living Family/patient expects to be discharged to:: Private residence Living Arrangements: Alone Available Help at Discharge: Family;Available PRN/intermittently Type of Home: Apartment Home Access: Level entry     Home Layout: One level Home Equipment: Cane - single point;Grab bars - tub/shower Additional Comments: has been doing a bird bath when not feeling well    Prior Function Level of Independence: Independent         Comments: does not have O2 concentrator; has only portable home O2 but was not using it unless he got short of breath; not even using at night;      Hand Dominance        Extremity/Trunk Assessment   Upper Extremity Assessment Upper Extremity Assessment: Generalized weakness    Lower Extremity Assessment Lower Extremity Assessment: Generalized weakness    Cervical / Trunk Assessment Cervical / Trunk Assessment: Normal  Communication   Communication: No difficulties  Cognition Arousal/Alertness: Awake/alert Behavior During Therapy: WFL for tasks assessed/performed Overall Cognitive Status: Within  Functional Limits for tasks assessed                                        General Comments General comments (skin integrity, edema, etc.): small room with monitor on rolling stand and IV pole with O2 extension tubing made walking any distance difficult    Exercises     Assessment/Plan    PT Assessment Patent does not need any further PT services  PT Problem List         PT Treatment Interventions      PT Goals (Current goals can be found in the Care Plan section)  Acute Rehab PT Goals Patient  Stated Goal: go home tomorrow PT Goal Formulation: All assessment and education complete, DC therapy    Frequency     Barriers to discharge        Co-evaluation               AM-PAC PT "6 Clicks" Daily Activity  Outcome Measure Difficulty turning over in bed (including adjusting bedclothes, sheets and blankets)?: None Difficulty moving from lying on back to sitting on the side of the bed? : None Difficulty sitting down on and standing up from a chair with arms (e.g., wheelchair, bedside commode, etc,.)?: None Help needed moving to and from a bed to chair (including a wheelchair)?: None Help needed walking in hospital room?: None Help needed climbing 3-5 steps with a railing? : A Little 6 Click Score: 23    End of Session Equipment Utilized During Treatment: Oxygen(5L) Activity Tolerance: Patient tolerated treatment well(SaO2 monitor reading poorly with activity-lowest clear SaO2 ) Patient left: in bed;with call bell/phone within reach Nurse Communication: Mobility status;Other (comment)(tried to check SaO2 with activity with poor reading) PT Visit Diagnosis: Difficulty in walking, not elsewhere classified (R26.2)    Time: 1041-1106(actually 1114 however subtracted time pt with MD) PT Time Calculation (min) (ACUTE ONLY): 25 min   Charges:   PT Evaluation $PT Eval Low Complexity: 1 Low            [employer], PT  03/04/2018, 11:26 AM

## 2018-03-04 NOTE — Progress Notes (Signed)
ANTICOAGULATION CONSULT NOTE - Follow Up Consult  Pharmacy Consult for Heparin Indication: pulmonary embolus  No Known Allergies  Patient Measurements: Height: 5' 9.5" (176.5 cm) Weight: 152 lb 1.9 oz (69 kg) IBW/kg (Calculated) : 71.85 Heparin Dosing Weight: 69  Vital Signs: Temp: 97.8 F (36.6 C) (10/13 1133) Temp Source: Oral (10/13 1133) BP: 119/78 (10/13 1133) Pulse Rate: 88 (10/13 1133)  Labs: Recent Labs    03/01/18 1628  03/02/18 0308 03/02/18 2140 03/03/18 0647  03/03/18 2012 03/04/18 0347 03/04/18 1432  HGB  --    < > 14.0 14.0  --   --   --  14.3  --   HCT  --   --  44.6 47.4  --   --   --  46.9  --   PLT  --   --  179 191  --   --   --  203  --   LABPROT 14.8  --   --   --   --   --   --   --   --   INR 1.16  --   --   --   --   --   --   --   --   HEPARINUNFRC  --   --   --  0.15*  --    < > 0.34 0.28* 0.44  CREATININE  --   --  0.77  --  0.76  --   --  0.72  --    < > = values in this interval not displayed.    Estimated Creatinine Clearance: 86.3 mL/min (by C-G formula based on SCr of 0.72 mg/dL).   Medications:  Scheduled:  . amLODipine  5 mg Oral Daily  . azithromycin  500 mg Oral Daily  . ipratropium-albuterol  3 mL Nebulization BID  . methylPREDNISolone (SOLU-MEDROL) injection  60 mg Intravenous Daily  . polyethylene glycol  17 g Oral Daily  . senna-docusate  1 tablet Oral BID  . sodium chloride flush  3 mL Intravenous Q12H   Assessment: 30 yoM presenting with SOB. Admitted with recurrent right lung pleural effusion, new onset hypoxemia in setting of stage IV non-small cell lung cancer. CT angio on 10/11 positive for acute PE. No evidence of right-heart strain. Pharmacy consulted to start IV heparin. Previous report of occasional hemoptysis noted, MD is aware. Heparin level now within goal range at 0.44 this PM following rate increase. CBC stable, no bleeding noted.  Goal of Therapy:  Heparin level = 0.3 - 0.5 units/mL due to  hemoptysis Monitor platelets by anticoagulation protocol: Yes   Plan:  Continue heparin drip at 1350 units / hr Daily heparin level and CBC Monitor for s/sx of bleeding F/u plans for [facility]  Shaheim Mahar N. Gerarda Fraction, PharmD PGY2 Infectious Diseases Pharmacy Resident Phone: [phone] 03/04/18   4:12 PM

## 2018-03-04 NOTE — Consult Note (Signed)
Consultation Note Date: 03/04/2018   Patient Name: Bradley Maldonado  DOB: 1950-12-14  MRN: [id number]  Age / Sex: 67 y.o., male  PCP: [name], NP-C Referring Physician: Lavina Hamman, MD  Reason for Consultation: Establishing goals of care  HPI/Patient Profile: 67 y.o. male admitted on 03/01/2018 from home with complaints of increasing shortness of breath. He has a past medical history of stage IV non-small cell lung cancer with malignant effusion, COPD, and hypertension. During ED course patient denied chest pain, fever, but endorses a week of shortness of breath with occasional hemoptysis. [name] being followed by Dr. Earlie Server at [facility] and receiving [facility]. His last treatment was 02/22/18 for cycle #17. Patient was started on 5L/[place] for support. [name] on room air at baseline. Portable chest x-ray showed right lung opacification with effusion and atelectasis. Since admission he continues to require oxygenation (6L/[place]). CT chest showed left upper obstructive emboli and left middle lobe emboli. He was started on a heparin drip for management. Attending provider have discussed patient's condition with oncology and patient was started on steroids given patient may have pneumonitis from [facility]. He was evaluated by cardiothoracic team and determined an ultrasound-guided thoracentesis would be most appropriate. Placement of a pleurx catheter would not be of any benefit to him. He also is scheduled to be seen by cardiology. Echo showed EF 20-25%. Palliative Medicine team consulted for goals of care discussion.   Clinical Assessment and Goals of Care: I have reviewed medical records including lab results, imaging, Epic notes, and MAR, received report from the bedside RN, and assessed the patient. I spent time at the bedside with patienting discussing his diagnosis prognosis, GOC, EOL wishes, disposition  and options. Bradley Maldonado is awake, A&O x3. He was sitting up on the side of the bed watching tv. I offered to involve other family members in his care and conversation, however, he reports he did not want anyone present and could have necessary discussions with his family as needed.   I introduced Palliative Medicine as specialized medical care for people living with serious illness. It focuses on providing relief from the symptoms and stress of a serious illness. The goal is to improve quality of life for both the patient and the family.  We discussed a brief life review of the patient. Patient reports he is a retired from [employer]. He lives alone. Enjoys watching tv and sitting on his porch. He reports he has 3 daughters who all live in [place]. However, he states "I did not raise them and was not in their life much so we don't talk as much as we should." He reports his aunt and uncle is a major support for him.   As far as functional and nutritional status Bradley Maldonado reports he lives alone and able to perform most of his ADLs independently. He ambulates with a cane at times. He states he has oxygen at home however he does not use it much. He states " it doesn't help  me much. Even when I use it I am still short of breath or feel as though I can't breath sometimes. If I get bad off I will put it on for a little while until I feel better." He says he stopped smoking a little over a month ago but prior to then he was smoking 1.5pk/day since the age of 86. He reports noticing increasing weakness and shortness of breath over the past several weeks and wonders if the chemo is causing this as he tends to feel the worst afterwards. He does not drive and relies on his aunt and uncle to run errands or take him to appointments.   We discussed his current illness and what it means in the larger context of his on-going co-morbidities.  Natural disease trajectory and expectations at EOL were discussed. Mr  Maldonado is very direct about his care and states he knows that he has "bad cancer!" He refers to that is why he is on chemo to try to "buy me some time because I have a lot left I need to do." I attempted to discuss in details his current illness and co-morbidities. He became tearful and somewhat withdrawn. He verbalized "if all is this is going on the way you say it is why hasn't my chemo doctor said this to me or any of my other doctors." Support was given and advised patient that I could only speak to his current condition and what is known since this hospitalization. He verbalized understanding and appreciation.   I attempted to elicit values and goals of care important to the patient.    The difference between aggressive medical intervention and comfort care was considered in light of the patient's goals of care. Bradley Maldonado that he would like to continue to treat the treatable with aggressive measures. He states he is not prepared to think otherwise until he is told that there is nothing else that can be done with his cancer. He became tearful again. I attempted to get patient to elaborate on how he would feel if he was told there were no other options and discussed best case worst case scenarios. He stated he couldn't talk about it much more because this is the first time he had been forced to really think about and consider what he would do if faced with end-of-life. Support was given. I asked patient if he felt that by contacting his daughter, they would come and be of support knowing his condition. He reported that he has an ok relationship with his oldest daughter, however she is busy with her own family and life as the others are, and they wouldn't stop what they were doing to be there for him given he hasn't been there for them over the years. I attempted to explain that they may not feel the way he is thinking and should at least consider contacting them and giving them the option to  be involved if they want to and hopefully be there for support for him. Patient verbalized understanding and reports he would consider and was going to mention it to his aunt.   Advanced directives, concepts specific to code status, artifical feeding and hydration, and rehospitalization were considered and discussed. Mr. Ridley verbalized he does not have any forms of advance directives. He states if he was unable to make medical decisions his aunt Hoyle Sauer would most likely be his medical decision maker and maybe his oldest daughter. I offered to have chaplain service come by  and assist with completion and patient agreed. I discussed in details patient's current full code status and what a potential code could look like in consideration to his current illness and co-morbidities. He verbalized understanding. Patient again became tearful and somewhat withdrawn. He later stated "why you come in with all the hard stuff and I have seen all these male doctors and none of them have said nothing like what you have said to me or ask me to think about all this stuff." Support given and explained we all work as a team and each provider approaches different conversations based on our specialty. Patient verbalized understanding.   At this time Mr. Speranza has requested to remain a full code and continue with all aggressive measures. He expresses he now has a lot to think about since we have spoken. He verbalizes his importance of following up with his Oncologist and hearing from him what his options are or are not.  Hospice and Palliative Care services outpatient were explained and offered. Patient verbalized understanding of both services and at this time request to have outpatient palliative for support.   Questions and concerns were addressed. The family was encouraged to call with questions or concerns.  PMT will continue to support holistically.  Primary Decision Maker: Patient is A&O x3 and capable of making  medical decisions. He verbalized if he is unable to make medical decisions his aunt Wallace Cullens would be his medical decision maker with assistance from his oldest daughter.  NEXT OF KIN   SUMMARY OF RECOMMENDATIONS    Full Code-at patient's request.   Continue to treat the treatable with aggressive measures as needed.   Patient very tearful and withdrawn at times during conversation. Verbalizes this is the first time he has been faced with having to think about his health and outcomes since he was diagnosed. [name] relying on guidance from his oncology team in regards to future options and continuation of treatment or not.   Patient would like to have outpatient palliative services at discharge. Also feels he may require some additional assistance due to his continuous weakness and fatigue in the home. PT with no recommendations.   Case Manager referral for outpatient Palliative.  PMT will continue to support patient and follow during hospitalization as needed.   Code Status/Advance Care Planning:  Full code  Palliative Prophylaxis:   Aspiration, Bowel Regimen, Frequent Pain Assessment and Oral Care  Additional Recommendations (Limitations, Scope, Preferences):  Full Scope Treatment  Psycho-social/Spiritual:   Desire for further Chaplaincy support:yes  Additional Recommendations: Caregiving  Support/Resources and Education on Hospice  Prognosis:  Unable to determine-Guarded to poor in the setting of stage IV metastatic adenocarcinoma of right lung, s/p cycle #17 Keytruda, hypertension, recurrent pleural effusion (malignant), s/p thoracentesis, COPD, acute respiratory failure with hypoxia, acute left PE, deconditioning, and dyspnea.   Discharge Planning: Patient requesting home with assistance and outpatient palliative.      Primary Diagnoses: Present on Admission: . Recurrent right pleural effusion   I have reviewed the medical record, interviewed the patient and  family, and examined the patient. The following aspects are pertinent.  Past Medical History:  Diagnosis Date  . Adenocarcinoma of right lung, stage 4 ([place]) 01/24/2017  . Goals of care, counseling/discussion 01/24/2017  . Hypertension    Social History   Socioeconomic History  . Marital status: Divorced    Spouse name: Not on file  . Number of children: Not on file  . Years of education: Not  on file  . Highest education level: Not on file  Occupational History  . Not on file  Social Needs  . Financial resource strain: Not on file  . Food insecurity:    Worry: Not on file    Inability: Not on file  . Transportation needs:    Medical: Not on file    Non-medical: Not on file  Tobacco Use  . Smoking status: Former Smoker    Last attempt to quit: 01/21/2018    Years since quitting: 0.1  . Smokeless tobacco: Never Used  Substance and Sexual Activity  . Alcohol use: Yes    Alcohol/week: 14.0 standard drinks    Types: 14 Cans of beer per week    Comment: occasionally  now since cancer diagnosis  . Drug use: Yes    Types: Marijuana, Cocaine    Comment: using cocaine 1-2 times per week  . Sexual activity: Not Currently  Lifestyle  . Physical activity:    Days per week: Not on file    Minutes per session: Not on file  . Stress: Not on file  Relationships  . Social connections:    Talks on phone: Not on file    Gets together: Not on file    Attends religious service: Not on file    Active member of club or organization: Not on file    Attends meetings of clubs or organizations: Not on file    Relationship status: Not on file  Other Topics Concern  . Not on file  Social History Narrative   [facility] (12/19/16):   No pets currently. No mold exposure. No recent travel.   Family History  Problem Relation Age of Onset  . Diabetes Mother   . Lung cancer Father   . Cancer Father   . Cancer - Lung Father    Scheduled Meds: . amLODipine  5 mg Oral Daily  . azithromycin   500 mg Oral Daily  . ipratropium-albuterol  3 mL Nebulization BID  . methylPREDNISolone (SOLU-MEDROL) injection  60 mg Intravenous Daily  . polyethylene glycol  17 g Oral Daily  . senna-docusate  1 tablet Oral BID  . sodium chloride flush  3 mL Intravenous Q12H   Continuous Infusions: . sodium chloride    . heparin 1,350 Units/hr (03/04/18 0838)   PRN Meds:.sodium chloride, acetaminophen **OR** acetaminophen, albuterol, HYDROcodone-acetaminophen, HYDROmorphone (DILAUDID) injection, ondansetron **OR** ondansetron (ZOFRAN) IV, sodium chloride flush, zolpidem Medications Prior to Admission:  Prior to Admission medications   Medication Sig Start Date End Date Taking? Authorizing Provider  amLODipine (NORVASC) 5 MG tablet Take 1 tablet (5 mg total) by mouth daily. 02/28/18  Yes Henson, Vickie L, NP-C  azithromycin (ZITHROMAX) 250 MG tablet Take 2 tablets on day 1, then 1 tablet on days 2-5. Patient not taking: Reported on 03/01/2018 02/08/18   Harland Dingwall L, NP-C   No Known Allergies Review of Systems  Constitutional: Positive for appetite change and fatigue.  Respiratory: Positive for cough and shortness of breath.   Cardiovascular: Positive for chest pain.  Neurological: Positive for weakness.  All other systems reviewed and are negative.   Physical Exam  Constitutional: [name] oriented to person, place, and time. Vital signs are normal. [name] cooperative. He appears ill.  Thin and chronically ill appearing   Cardiovascular: Normal rate, regular rhythm, normal heart sounds and normal pulses.  Pulmonary/Chest: Effort normal. He has decreased breath sounds.  Shortness of breath at times   Abdominal: Soft. Normal appearance  and bowel sounds are normal.  Neurological: [name] alert and oriented to person, place, and time.  Skin: Skin is warm, dry and intact.  Psychiatric: He has a normal mood and affect. His speech is normal and behavior is normal. Judgment and thought content normal.  Cognition and memory are normal.  Nursing note and vitals reviewed.   Vital Signs: BP 119/78 (BP Location: Right Arm)   Pulse 88   Temp 97.8 F (36.6 C) (Oral)   Resp 16   Ht 5' 9.5" (1.765 m)   Wt 69 kg   SpO2 90%   BMI 22.14 kg/m  Pain Scale: 0-10 POSS *See Group Information*: 1-Acceptable,Awake and alert Pain Score: 0-No pain   SpO2: SpO2: 90 % O2 Device:SpO2: 90 % O2 Flow Rate: .O2 Flow Rate (L/min): 5 L/min  IO: Intake/output summary: No intake or output data in the 24 hours ending 03/04/18 1556  LBM: Last BM Date: 03/04/18 Baseline Weight: Weight: 69 kg Most recent weight: Weight: 69 kg     Palliative Assessment/Data: PPS 40 %   Time In: 1330 Time Out: 1445 Time Total: 75 min.  Greater than 50%  of this time was spent counseling and coordinating care related to the above assessment and plan.  Signed by:  Alda Lea, AGPCNP-BC Palliative Medicine Team  Phone: [phone] Fax: [phone] Pager: [phone] Amion: Bjorn Pippin    Please contact Palliative Medicine Team phone at [phone] for questions and concerns.  For individual provider: See Shea Evans

## 2018-03-05 ENCOUNTER — Other Ambulatory Visit: Payer: Self-pay | Admitting: Radiation Therapy

## 2018-03-05 ENCOUNTER — Inpatient Hospital Stay (HOSPITAL_COMMUNITY): Payer: Medicare Other

## 2018-03-05 ENCOUNTER — Encounter: Payer: Self-pay | Admitting: Radiation Therapy

## 2018-03-05 DIAGNOSIS — C7949 Secondary malignant neoplasm of other parts of nervous system: Principal | ICD-10-CM

## 2018-03-05 DIAGNOSIS — C7931 Secondary malignant neoplasm of brain: Secondary | ICD-10-CM

## 2018-03-05 DIAGNOSIS — Z515 Encounter for palliative care: Secondary | ICD-10-CM

## 2018-03-05 DIAGNOSIS — J90 Pleural effusion, not elsewhere classified: Secondary | ICD-10-CM

## 2018-03-05 DIAGNOSIS — Z7189 Other specified counseling: Secondary | ICD-10-CM

## 2018-03-05 DIAGNOSIS — I5041 Acute combined systolic (congestive) and diastolic (congestive) heart failure: Secondary | ICD-10-CM

## 2018-03-05 LAB — CBC
HCT: 47.8 % (ref 39.0–52.0)
HEMOGLOBIN: 14.3 g/dL (ref 13.0–17.0)
MCH: 26.7 pg (ref 26.0–34.0)
MCHC: 29.9 g/dL — ABNORMAL LOW (ref 30.0–36.0)
MCV: 89.3 fL (ref 80.0–100.0)
NRBC: 0 % (ref 0.0–0.2)
PLATELETS: 229 10*3/uL (ref 150–400)
RBC: 5.35 MIL/uL (ref 4.22–5.81)
RDW: 16.4 % — ABNORMAL HIGH (ref 11.5–15.5)
WBC: 7.5 10*3/uL (ref 4.0–10.5)

## 2018-03-05 LAB — BASIC METABOLIC PANEL
ANION GAP: 7 (ref 5–15)
BUN: 12 mg/dL (ref 8–23)
CO2: 29 mmol/L (ref 22–32)
Calcium: 9.1 mg/dL (ref 8.9–10.3)
Chloride: 104 mmol/L (ref 98–111)
Creatinine, Ser: 0.71 mg/dL (ref 0.61–1.24)
GFR calc Af Amer: >60 mL/min (ref >60)
GLUCOSE: 132 mg/dL — AB (ref 70–99)
POTASSIUM: 4.4 mmol/L (ref 3.5–5.1)
Sodium: 140 mmol/L (ref 135–145)

## 2018-03-05 LAB — HEPARIN LEVEL (UNFRACTIONATED): Heparin Unfractionated: 1.04 IU/mL — ABNORMAL HIGH (ref 0.30–0.70)

## 2018-03-05 MED ORDER — DEXAMETHASONE 4 MG PO TABS: ORAL_TABLET | ORAL | 0 refills | Status: DC

## 2018-03-05 MED ORDER — LOSARTAN POTASSIUM 25 MG PO TABS: 25.0000 mg | ORAL_TABLET | Freq: Every day | ORAL | 0 refills | Status: DC

## 2018-03-05 MED ORDER — POLYETHYLENE GLYCOL 3350 17 G PO PACK: 17.0000 g | PACK | Freq: Every day | ORAL | 0 refills | Status: AC

## 2018-03-05 MED ORDER — DEXAMETHASONE 2 MG PO TABS: ORAL_TABLET | ORAL | 0 refills | Status: DC

## 2018-03-05 MED ORDER — HYDROCODONE-ACETAMINOPHEN 5-325 MG PO TABS: 1.0000 | ORAL_TABLET | ORAL | 0 refills | Status: AC | PRN

## 2018-03-05 MED ORDER — ELIQUIS 5 MG VTE STARTER PACK: ORAL_TABLET | ORAL | 0 refills | Status: DC

## 2018-03-05 MED ORDER — SENNOSIDES-DOCUSATE SODIUM 8.6-50 MG PO TABS: 1.0000 | ORAL_TABLET | Freq: Every day | ORAL | 0 refills | Status: AC

## 2018-03-05 MED ORDER — CARVEDILOL 3.125 MG PO TABS: 3.1250 mg | ORAL_TABLET | Freq: Two times a day (BID) | ORAL | 0 refills | Status: AC

## 2018-03-05 MED ORDER — DEXAMETHASONE 4 MG PO TABS
4.0000 mg | ORAL_TABLET | Freq: Three times a day (TID) | ORAL | Status: DC
  Administered 2018-03-05 – 2018-03-06 (×3): 4 mg via ORAL
  Filled 2018-03-05 (×3): qty 1

## 2018-03-05 MED FILL — CARVEDILOL 3.125 MG TABLET: 3.125 | 30 days supply | Qty: 60 | Fill #0

## 2018-03-05 MED FILL — LOSARTAN POTASSIUM 25 MG TA: 25 | 30 days supply | Qty: 30 | Fill #0

## 2018-03-05 MED FILL — SENNA S 8.6-50 MG TABS: 8.6-50 | 100 days supply | Qty: 100 | Fill #0

## 2018-03-05 MED FILL — ELIQUIS STARTER PACK 5 MG T: 5 | 30 days supply | Qty: 74 | Fill #0

## 2018-03-05 MED FILL — POLYETHYLENE GLYCOL 3350 PO: 14 days supply | Qty: 238 | Fill #0

## 2018-03-05 MED FILL — DEXAMETHASONE 4 MG TABLET: 4 | 30 days supply | Qty: 50 | Fill #0

## 2018-03-05 NOTE — Addendum Note (Signed)
Addended by: Pincus Large on: 03/05/2018 01:50 PM   Modules accepted: Orders

## 2018-03-05 NOTE — Care Management (Signed)
#   6.  S/W AUBRIANNA  @ [facility] [employer] # [phone]  APIXABAN _ NONE FORMULARY ELIQUIS  10 MG : NONE FORMULARY   1. ELIQUIS   2.5 MG BID COVER- YES CO-PAY- $ 45.00 Q/L TWO PILL PER DAY TIER- 3 DRUG PRIOR APPROVAL- NO  2. ELIQUIS  5 MG BID COVER- YES CO-PAY- $ 45.00  Q/L TWO PILL PER DAY TIER- 3 DRUG PRIOR APPROVAL- NO  RIVAROXABAN: NONE FORMULARY  3. XARELTO 15 MG BID COVER- YES CO-PAY- $ 45.00 Q/L TWO PILL PER DAY TIER- 3 DRUG PRIOR APPROVAL- NO  4. XARELTO  20 MG DAILY COVER- YES CO-PAY- $ 45.00 TIER- 3 DRUG PRIOR APPROVAL- NO  PREFERRED PHARMACY : YES [facility]

## 2018-03-05 NOTE — Progress Notes (Signed)
Progress Note  Patient Name: Bradley Maldonado Date of Encounter: 03/05/2018  Primary Cardiologist: No primary care provider on file.     Subjective   Feeling well.  Inpatient Medications    Scheduled Meds: . apixaban  10 mg Oral Q12H   Followed by  . [START ON 03/11/2018] apixaban  5 mg Oral BID  . azithromycin  500 mg Oral Daily  . carvedilol  3.125 mg Oral BID WC  . dexamethasone  4 mg Intravenous [id number]  . ipratropium-albuterol  3 mL Nebulization BID  . losartan  25 mg Oral Daily  . polyethylene glycol  17 g Oral Daily  . senna-docusate  1 tablet Oral BID  . sodium chloride flush  3 mL Intravenous Q12H   Continuous Infusions: . sodium chloride     PRN Meds: sodium chloride, acetaminophen **OR** acetaminophen, albuterol, HYDROcodone-acetaminophen, HYDROmorphone (DILAUDID) injection, ondansetron **OR** ondansetron (ZOFRAN) IV, sodium chloride flush, zolpidem   Vital Signs    Vitals:   03/04/18 1707 03/04/18 2118 03/04/18 2318 03/05/18 0726  BP: 126/80  113/68 110/60  Pulse: 93  90 85  Resp: 18  16 16   Temp: 97.6 F (36.4 C)  98 F (36.7 C) 98.2 F (36.8 C)  TempSrc: Oral  Oral Oral  SpO2: 99% (!) 88% 92% 98%  Weight:      Height:        Intake/Output Summary (Last 24 hours) at 03/05/2018 0756 Last data filed at 03/04/2018 1811 Gross per 24 hour  Intake -  Output 775 ml  Net -775 ml   Filed Weights   03/01/18 0657  Weight: 69 kg     Physical Exam  Gen: NAD CV: RRR, no murmurs Lungs: clear Abd: soft Extrem: warm, well perfused. Neuro: grossly nonfocal Psych: normal affect  Labs    Chemistry Recent Labs  Lab 03/03/18 0647 03/04/18 0347 03/05/18 0402  NA 138 138 140  K 4.5 4.1 4.4  CL 102 101 104  CO2 27 28 29   GLUCOSE 158* 127* 132*  BUN 10 11 12   CREATININE 0.76 0.72 0.71  CALCIUM 8.9 9.0 9.1  GFRNONAA >60 >60 >60  GFRAA >60 >60 >60  ANIONGAP 9 9 7      Hematology Recent Labs  Lab 03/02/18 2140 03/04/18 0347  03/05/18 0402  WBC 7.6 9.3 7.5  RBC 5.25 5.25 5.35  HGB 14.0 14.3 14.3  HCT 47.4 46.9 47.8  MCV 90.3 89.3 89.3  MCH 26.7 27.2 26.7  MCHC 29.5* 30.5 29.9*  RDW 16.3* 16.2* 16.4*  PLT 191 203 229    Cardiac EnzymesNo results for input(s): TROPONINI in the last 168 hours.  Recent Labs  Lab 03/01/18 0704  TROPIPOC 0.00     BNP Recent Labs  Lab 03/01/18 0701  BNP 33.9     DDimer No results for input(s): DDIMER in the last 168 hours.   Radiology    Ct Head W & Wo Contrast  Result Date: 03/04/2018 CLINICAL DATA:  Exophthalmos.  Small cell lung cancer. EXAM: CT HEAD AND ORBITS WITHOUT AND WITH CONTRAST TECHNIQUE: Contiguous axial images were obtained from the base of the skull through the vertex with and without contrast. Multidetector CT imaging of the orbits was performed using the standard protocol with and without intravenous contrast. CONTRAST:  21mL OMNIPAQUE IOHEXOL 300 MG/ML  SOLN COMPARISON:  MR head 12/20/2016. FINDINGS: CT HEAD FINDINGS Brain: No acute stroke or hemorrhage. There are multiple intracranial mass lesions consistent with metastases, which demonstrate homogeneous postcontrast  enhancement. The largest measures 8 mm, LEFT periventricular location. BILATERAL frontal, parietal, and occipital metastases are observed. None are seen in the cerebellum or brainstem. Vasogenic edema is greatest in the LEFT occipital lobe. Vascular: Visible vessels are patent. Vascular calcification consistent with intracranial atherosclerotic disease. Skull: No calvarial destructive lesions. Other: None. CT ORBITS FINDINGS Orbits: No traumatic or inflammatory finding. Globes, optic nerves, orbital fat, extraocular muscles, vascular structures, and lacrimal glands are normal. Symmetric proptosis, uncertain significance. Doubt increased intracranial pressure. Visualized sinuses: Clear. Soft tissues: Unremarkable. IMPRESSION: CT head without and with contrast demonstrates BILATERAL subcentimeter  supratentorial intra-axial metastases, consistent with the pattern frequently seen in small cell lung cancer. No midline shift, but asymmetric vasogenic edema is most pronounced in the LEFT occipital lobe. Detection of all metastases is more readily accomplished using pre and postcontrast MRI of the brain, a modality which displays increased sensitivity to smaller metastatic deposits. No osseous metastatic disease. 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 Significance uncertain. A call has been placed to the ordering provider. Electronically Signed   By: Staci Righter M.D.   On: 03/04/2018 17:20   Ct Orbits W Contrast  Result Date: 03/04/2018 CLINICAL DATA:  Exophthalmos.  Small cell lung cancer. EXAM: CT HEAD AND ORBITS WITHOUT AND WITH CONTRAST TECHNIQUE: Contiguous axial images were obtained from the base of the skull through the vertex with and without contrast. Multidetector CT imaging of the orbits was performed using the standard protocol with and without intravenous contrast. CONTRAST:  90mL OMNIPAQUE IOHEXOL 300 MG/ML  SOLN COMPARISON:  MR head 12/20/2016. FINDINGS: CT HEAD FINDINGS Brain: No acute stroke or hemorrhage. There are multiple intracranial mass lesions consistent with metastases, which demonstrate homogeneous postcontrast enhancement. The largest measures 8 mm, LEFT periventricular location. BILATERAL frontal, parietal, and occipital metastases are observed. None are seen in the cerebellum or brainstem. Vasogenic edema is greatest in the LEFT occipital lobe. Vascular: Visible vessels are patent. Vascular calcification consistent with intracranial atherosclerotic disease. Skull: No calvarial destructive lesions. Other: None. CT ORBITS FINDINGS Orbits: No traumatic or inflammatory finding. Globes, optic nerves, orbital fat, extraocular muscles, vascular structures, and lacrimal glands are normal. Symmetric proptosis, uncertain significance. Doubt increased intracranial pressure.  Visualized sinuses: Clear. Soft tissues: Unremarkable. IMPRESSION: CT head without and with contrast demonstrates BILATERAL subcentimeter supratentorial intra-axial metastases, consistent with the pattern frequently seen in small cell lung cancer. No midline shift, but asymmetric vasogenic edema is most pronounced in the LEFT occipital lobe. Detection of all metastases is more readily accomplished using pre and postcontrast MRI of the brain, a modality which displays increased sensitivity to smaller metastatic deposits. No osseous metastatic disease. 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 Significance uncertain. A call has been placed to the ordering provider. Electronically Signed   By: Staci Righter M.D.   On: 03/04/2018 17:20    Cardiac Studies  Echo 03/03/18 Study Conclusions  - Procedure narrative: Transthoracic echocardiography. Image   quality was entirely suboptimal. The study was technically   difficult, as a result of restricted patient mobility. - Left ventricle: The cavity size was normal. Wall thickness was   increased in a pattern of moderate LVH. Systolic function was   severely reduced. The estimated ejection fraction was in the   range of 25% to 30%. Diffuse hypokinesis. - Inferior vena cava: The vessel was normal in size. The   respirophasic diameter changes were in the normal range (>= 50%),   consistent with normal central venous pressure.  Impressions:  - Very technically  difficult study. Suboptimal echo windows.   Definity contrast was not given and would be helpful. LVEF   appears reduced to 25-30% - the ventricle is dilated. Recommend   repeat limited echo with Definity contrast or further evaluation   with cMRI or TEE to assess LVEF and wall motion.  Patient Profile     SATVIK PARCO is a 67 y.o. male with stage IV adenocarcinoma of the lung on immunotherapy who was admitted with 3-6 weeks of progressive dyspnea on exertion, some peripheral edema  that is asymmetric, orthopnea and nocturnal dyspnea.  Assessment & Plan    Active Problems:   Pleural effusion, malignant   Hypertension   Lung cancer ([place])   Acute combined systolic and diastolic heart failure (H[facility])  Tolerating heart failure therapies well. No other changes at this time. Continue carvedilol and losartan. [name] off of amlodipine in favor of better heart failure therapy.  Will see again tomorrow. Limited discussions with patient today at his preference, he had family present and preferred to spend time with them.  For questions or updates, please contact [facility] Please consult [email or web address] for contact info under        Signed, Elouise Munroe, MD  03/05/2018, 7:56 AM

## 2018-03-05 NOTE — Progress Notes (Signed)
error 

## 2018-03-05 NOTE — Progress Notes (Signed)
[facility] Progress Note  Patient: Bradley Maldonado VWU:[id number]   PCP: [name], NP-C DOB: June 25, 1950   DOA: 03/01/2018   DOS: 03/05/2018   Date of Service: the patient was seen and examined on 03/05/2018  Brief hospital course: Pt. with PMH of stage IV non-small cell lung cancer with malignant effusion on the right, COPD, HTN; admitted on 03/01/2018, presented with complaint of shortness of breath, was found to have acute hypoxic respiratory failure, acute left pulmonary embolism. Currently further plan is continue current care.  Subjective: Denies any acute complaint.  Still has some blood in the sputum.  No nausea no vomiting.  Breathing okay.  Assessment and Plan: 1.  Acute hypoxic respiratory failure. Malignant pleural effusion Acute left pulmonary embolism Hemoptysis  Known history of COPD as well as right-sided lung cancer but now started to develop 1 week of shortness of breath some hemoptysis orthopnea and PND. Prefer to laying on the right side. Currently requiring 6 L of oxygen from low oxygen at home. Chest x-ray shows opacification of the right lung.  And there was some concern for pleural effusion. Cardiothoracic was consulted for Pleurx catheter placement but they felt that the patient will not get any benefit out of the Pleurx catheter. IR was consulted for thoracentesis although ultrasound was negative for any significant fluid that they can drain. Patient does have large fluid collection with thick necrotic rim around it consistent with his malignancy. CT chest PE protocol was performed which is positive for left upper lobe obstructive emboli and left middle lobe emboli as well these are not large enough to explain the degree of the hypoxia and the patient has. There is a possibility that the patient may have pneumonitis from him being on Keytruda and I will start the patient on 1 mg/kg steroids.  This was discussed with on-call oncology. 70 mg oral  prednisone, equivalent dose is 56 mg of IV Solu-Medrol, 11 mg of Decadron. Currently will continue 4 mg 3 times daily Decadron followed by weekly taper to 4 mg daily. Continue oral azithromycin for now Although no evidence of new consolidation and a CT scan of the chest. Continue pain control with Dilaudid and oral narcotics. We started the patient on IV heparin after discussion.  Now that he is tolerating it very well we will transition him to oral Eliquis. Given his history of hemoptysis patient received lower bolus. Pharmacy consulted.  2.  History of COPD. Continue nebulizer.  Added steroids.  Patient does not have any significant bilateral wheezing concerning for active COPD exacerbation.  3.  Essential hypertension. Patient is on amlodipine 5 mg daily. Blood pressure was significantly elevated initially but now appears to have well controlled. Monitor closely a low threshold to discontinue amlodipine.  4.  Dyspnea with hypoxia. Chronic systolic CHF.  New diagnosis. I do appreciate cardiomegaly on the CT scan. Echocardiogram was done which shows 20 to 25% EF.  No pericardial effusion.  Appreciate cardiology input.  Medications adjusted.  No plan for ischemic work-up.  5.  Metastatic non small cell cancer. Exophthalmos. Prior to transitioning the patient to Eliquis CT of the head with and without contrast as well as CT of the orbit were performed which were requested by ophthalmology. No acute abnormality in the orbit were identified that requires a surgical correction and therefore patient was transitioned to oral Eliquis. On the other hand CT head is positive for multiple metastatic lesions. Discussed with radiation oncology, appreciate their consultation for possible radiation  Discussed with oncology, recommend outpatient follow-up. Already on IV Solu-Medrol for vasogenic edema seen on the CT scan.  Transition to Decadron.  Diet: Cardiac diet DVT Prophylaxis: subcutaneous  Heparin  Advance goals of care discussion: full code for now.  I have concerned that the patient is deteriorating faster. I will also concerned that the patient may not survive this hospitalization given that there is not significant have changed on his CT scan but the patient is significantly symptomatic. Palliative care consulted for further discussion of goals of care in this patient.  Family Communication: no family was present at bedside, at the time of interview.    Disposition:  Discharge to be determined.  Consultants: Cardiac thoracic surgery, oncology phone consultation Procedures: Echocardiogram   Scheduled Meds: . apixaban  10 mg Oral Q12H   Followed by  . [START ON 03/11/2018] apixaban  5 mg Oral BID  . azithromycin  500 mg Oral Daily  . carvedilol  3.125 mg Oral BID WC  . dexamethasone  4 mg Oral Q8H  . ipratropium-albuterol  3 mL Nebulization BID  . losartan  25 mg Oral Daily  . polyethylene glycol  17 g Oral Daily  . senna-docusate  1 tablet Oral BID  . sodium chloride flush  3 mL Intravenous Q12H   Continuous Infusions: . sodium chloride     PRN Meds: sodium chloride, acetaminophen **OR** acetaminophen, albuterol, HYDROcodone-acetaminophen, HYDROmorphone (DILAUDID) injection, ondansetron **OR** ondansetron (ZOFRAN) IV, sodium chloride flush, zolpidem Antibiotics: Anti-infectives (From admission, onward)   Start     Dose/Rate Route Frequency Ordered Stop   03/01/18 1600  azithromycin (ZITHROMAX) tablet 500 mg    Note to Pharmacy:  Take 2 tablets on day 1, then 1 tablet on days 2-5.     500 mg Oral Daily 03/01/18 1559         Objective: Physical Exam: Vitals:   03/04/18 2318 03/05/18 0726 03/05/18 0853 03/05/18 1649  BP: 113/68 110/60  110/71  Pulse: 90 85  78  Resp: 16 16  17   Temp: 98 F (36.7 C) 98.2 F (36.8 C)    TempSrc: Oral Oral    SpO2: 92% 98% 95% 95%  Weight:      Height:        Intake/Output Summary (Last 24 hours) at 03/05/2018  1745 Last data filed at 03/05/2018 1718 Gross per 24 hour  Intake -  Output 3025 ml  Net -3025 ml   Filed Weights   03/01/18 0657  Weight: 69 kg   General: Alert, Awake and Oriented to Time, Place and Person. Appear in moderate distress, affect appropriate Eyes: PERRL, Conjunctiva normal ENT: Oral Mucosa clear moist. Neck: no JVD, no Abnormal Mass Or lumps Cardiovascular: S1 and S2 Present, no Murmur, Peripheral Pulses Present Respiratory: increased respiratory effort, Bilateral Air entry equal and Decreased, no use of accessory muscle, right Crackles, right wheezes Abdomen: Bowel Sound present, Soft and no tenderness, no hernia Skin: no redness, no Rash, no induration Extremities: no Pedal edema, no calf tenderness Neurologic: Grossly no focal neuro deficit. Bilaterally Equal motor strength  Data Reviewed: CBC: Recent Labs  Lab 03/01/18 0701 03/02/18 0308 03/02/18 2140 03/04/18 0347 03/05/18 0402  WBC 6.5 7.6 7.6 9.3 7.5  HGB 15.6 14.0 14.0 14.3 14.3  HCT 50.8 44.6 47.4 46.9 47.8  MCV 88.7 87.3 90.3 89.3 89.3  PLT 184 179 191 203 259   Basic Metabolic Panel: Recent Labs  Lab 03/01/18 0701 03/02/18 0308 03/03/18 5638 03/04/18 0347 03/05/18 0402  NA 139 139 138 138 140  K 3.9 3.8 4.5 4.1 4.4  CL 103 102 102 101 104  CO2 26 27 27 28 29   GLUCOSE 116* 139* 158* 127* 132*  BUN 6* 13 10 11 12   CREATININE 0.71 0.77 0.76 0.72 0.71  CALCIUM 8.9 9.0 8.9 9.0 9.1  MG  --   --  2.1  --   --     Liver Function Tests: No results for input(s): AST, ALT, ALKPHOS, BILITOT, PROT, ALBUMIN in the last 168 hours. No results for input(s): LIPASE, AMYLASE in the last 168 hours. No results for input(s): AMMONIA in the last 168 hours. Coagulation Profile: Recent Labs  Lab 03/01/18 1628  INR 1.16   Cardiac Enzymes: No results for input(s): CKTOTAL, CKMB, CKMBINDEX, TROPONINI in the last 168 hours. BNP (last 3 results) No results for input(s): PROBNP in the last 8760  hours. CBG: No results for input(s): GLUCAP in the last 168 hours. Studies: No results found.   Time spent: 35 minutes  Author: Berle Mull, MD [facility] Pager: [phone] 03/05/2018 5:45 PM  If 7PM-7AM, please contact night-coverage at [email or web address], password [facility]

## 2018-03-05 NOTE — Progress Notes (Addendum)
Spoke with Bradley Maldonado about having an MRI this afternoon. He said that he just does not feel up to having the scan today, but is willing to attempt it tomorrow before going home. He asked that it not be early in the morning because he does not breathe well in the mornings.   I spoke with his nurse, Justice Rocher, about this. She will not be there tomorrow, but said that she will inform the nurse taking care of him tomorrow that we need the scan done prior to discharge.   Mont Dutton R.T.(R)(T) Special Procedures Navigator

## 2018-03-06 LAB — BASIC METABOLIC PANEL
Anion gap: 11 (ref 5–15)
BUN: 14 mg/dL (ref 8–23)
CO2: 28 mmol/L (ref 22–32)
CREATININE: 0.71 mg/dL (ref 0.61–1.24)
Calcium: 9.1 mg/dL (ref 8.9–10.3)
Chloride: 100 mmol/L (ref 98–111)
GFR calc Af Amer: >60 mL/min (ref >60)
GFR calc non Af Amer: >60 mL/min (ref >60)
GLUCOSE: 208 mg/dL — AB (ref 70–99)
POTASSIUM: 4 mmol/L (ref 3.5–5.1)
SODIUM: 139 mmol/L (ref 135–145)

## 2018-03-06 NOTE — Progress Notes (Signed)
Mr. Collington has been discharged from the hospital today prior to our rounds. I spoke to Dr. Posey Pronto who has arranged cardiology follow up as an outpatient for Bradley Maldonado. His heart failure therapies were continued at time of discharge. No other current recommendations from cardiology.

## 2018-03-06 NOTE — Progress Notes (Signed)
Spoke with MRI tech this morning.  Tech said pt declined to have MRI last night.  Spoke with pt again this morning to see if he was ready to get the MRI.  Pt said he does not want to do it right now but maybe later.  I explained that if he is having anxiety we could use an antianxiety medication just prior to getting in the scanner.  Pt said he did not want the test today.

## 2018-03-06 NOTE — Care Management Note (Addendum)
Case Management Note  Patient Details  Name: Bradley Maldonado MRN: [id number] Date of Birth: 12-Jul-1950  Subjective/Objective:  From home with spouse, presents with right lung pl effusion, hx of stage iv non small cell lung ca on immunotherapy, new onset of hypoxemia, for thoracentesis ( could not be done) , evaluation by cardiothoracic surgery today, copd, htn.    10/14 Tomi Bamberger RN, BSN - NCM gave patient the eliquis 30 day free coupon and information on his refill co pay amt of 45.00.  NCM spoke with patient about outpatient palliative services, he states he is not sure he wants this service right now, he will let me know tomorrow.  NCM offered to set up [facility] for disease Management for patient he states he is not sure he would want this eiither, he will let NCM know tomorrow  10/15 Tomi Bamberger RN, BSN- Patient has decided that he wants [facility], with [facility], referral given to [facility] with [facility], soc will begin 24-48 hrs post dc. Also NCM gave patient the eliquis patient asst application.  He has 30 day free savings coupon for eliquis and when it was ran by pharmacy it came up for co pay of 140.00 which insurance company states he has 60.00 to before [facility] and his copay is showing on there end for 45.00.                     Action/Plan: DC home.  Expected Discharge Date:  03/06/18               Expected Discharge Plan:  [place]  In-House Referral:     Discharge planning Services  CM Consult  Post Acute Care Choice:  Home Health Choice offered to:  Patient  DME Arranged:    DME Agency:     HH Arranged:  RN [place] Agency:     Status of Service:  Completed, signed off  If discussed at [employer] of Stay Meetings, dates discussed:    Additional Comments:  Zenon Mayo, RN 03/06/2018, 11:58 AM

## 2018-03-06 NOTE — Progress Notes (Signed)
SATURATION QUALIFICATIONS: (This note is used to comply with regulatory documentation for home oxygen)  Patient Saturations on Room Air at Rest 86%  Patient Saturations on Room Air while Ambulating 86%  Patient Saturations on 3 Liters of oxygen while Ambulating 91%  Please briefly explain why patient needs home oxygen: Pt requires supplemental oxygen to maintain minimum requirement of 91%

## 2018-03-06 NOTE — Progress Notes (Signed)
Ambulated patient with 3 liters of oxygen. Pt was able to maintain oxygen saturation of 91%.

## 2018-03-06 NOTE — Progress Notes (Signed)
Location/Histology of Brain Tumor:  03/04/18 CT head IMPRESSION: CT head without and with contrast demonstrates BILATERAL subcentimeter supratentorial intra-axial metastases, consistent with the pattern frequently seen in small cell lung cancer. No midline shift, but asymmetric vasogenic edema is most pronounced in the LEFT occipital lobe.  Patient presented with symptoms of:  None. CT of orbit performed at the request of ophthalmology (while in the hospital) and brain metastasis discovered.   Past or anticipated interventions, if any, per neurosurgery:  None  Past or anticipated interventions, if any, per medical oncology:  Dr. Earlie Server last on 02/22/18: ASSESSMENT AND PLAN: This is a very pleasant 67 years old African-American male with a stage IV non-small cell lung cancer, adenocarcinoma with PDL 1 expression of 50% and negative for actionable mutations. The patient is currently undergoing treatment with immunotherapy with Keytruda 200 mg IV every 3 weeks status post 16 cycles.  He continues to tolerate this treatment well with no concerning adverse effects. I recommended for the patient to proceed with cycle #17 today as scheduled. I will see the patient back for follow-up visit in 3 weeks for evaluation before starting cycle #18.  Next 03/15/18  Dose of Decadron, if applicable: Decadron taper - currently 4 mg three times daily.   Recent neurologic symptoms, if any:   Seizures: No  Headaches: No  Nausea: No  Dizziness/ataxia: Yes, he reports mild dizziness when turning his head.   Difficulty with hand coordination: No  Focal numbness/weakness: [name] weak today. He reports becoming short of breath with any type of activity.   Visual deficits/changes: No  Confusion/Memory deficits: No  Painful bone metastases at present, if any: N/A  SAFETY ISSUES:  Prior radiation? No  Pacemaker/ICD? No  Possible current pregnancy? No  Is the patient on methotrexate?  No  Additional Complaints / other details:   BP 118/61 (BP Location: Left Arm, Patient Position: Sitting)   Pulse 89   Temp 98.1 F (36.7 C) (Oral)   Resp 18   Ht 5\' 10"  (1.778 m)   Wt 156 lb 4 oz (70.9 kg)   SpO2 90%   BMI 22.42 kg/m    Wt Readings from Last 3 Encounters:  03/07/18 156 lb 4 oz (70.9 kg)  03/01/18 152 lb 1.9 oz (69 kg)  02/22/18 153 lb (69.4 kg)

## 2018-03-06 NOTE — Discharge Instructions (Signed)
Living With Heart Failure  Heart failure is a long-term (chronic) condition in which the heart cannot pump enough blood through the body. When this happens, parts of the body do not get the blood and oxygen they need. There is no cure for heart failure at this time, so it is important for you to take good care of yourself and follow the treatment plan set by your health care provider. If you are living with heart failure, there are ways to help you manage the disease. Follow these instructions at home: Living with heart failure requires you to make changes in your life. Your health care team will teach you about the changes you need to make in order to relieve your symptoms and lower your risk of going to the hospital. Follow the treatment plan as set by your health care provider. Medicines Medicines are important in reducing your heart's workload, slowing the progression of heart failure, and improving your symptoms.  Take over-the-counter and prescription medicines only as told by your health care provider.  Do not stop taking your medicine unless your health care provider tells you to do that.  Do not skip any dose of your medicine.  Refill prescriptions before you run out of medicine. You need your medicines every day.  Eating and drinking  Eat heart-healthy foods. Talk with a dietitian to make an eating plan that is right for you. ? If directed by your health care provider: ? Limit salt (sodium). Lowering your sodium intake may reduce symptoms of heart failure. Ask a dietitian to recommend heart-healthy seasonings. ? Limit your fluid intake. Fluid restriction may reduce symptoms of heart failure. ? Use low-fat cooking methods instead of frying. Low-fat methods include roasting, grilling, broiling, baking, poaching, steaming, and stir-frying. ? Choose foods that contain no trans fat and are low in saturated fat and cholesterol. Healthy choices include fresh or frozen fruits and vegetables,  fish, lean meats, legumes, fat-free or low-fat dairy products, and whole-grain or high-fiber foods.  Limit alcohol intake to no more than 1 drink a day for nonpregnant women and 2 drinks a day for men. One drink equals 12 oz of beer, 5 oz of wine, or 1 oz of hard liquor. ? Drinking more than that is harmful to your heart. Tell your health care provider if you drink alcohol several times a week. ? Talk with your health care provider about whether any level of alcohol use is safe for you. Activity  Ask your health care provider about attending cardiac rehabilitation. These programs include aerobic physical activity, which provides many benefits for your heart.  If no cardiac rehabilitation program is available, ask your health care provider what aerobic exercises are safe for you to do. Lifestyle Make the lifestyle changes recommended by your health care provider. In general:  Lose weight if your health care provider tells you to do that. Weight loss may reduce symptoms of heart failure.  Do not use any products that contain nicotine or tobacco, such as cigarettes or e-cigarettes. If you need help quitting, ask your health care provider.  Do not use street (illegal) drugs.  Return to your normal activities as told by your health care provider. Ask your health care provider what activities are safe for you.  General instructions  Make sure you weigh yourself every day to track your weight. Rapid weight gain may indicate an increase in fluid in your body and may increase the workload of your heart. ? Weigh yourself every morning. Do  this after you urinate but before you eat breakfast. ? Wear the same type of clothing, without shoes, each time you weigh yourself. ? Weigh yourself on the same scale and in the same spot each time.  Living with chronic heart failure often leads to emotions such as fear, stress, anxiety, and depression. If you feel any of these emotions and need help coping,  contact your health care provider. Other ways to get help include: ? Talking to friends and family members about your condition. They can give you support and guidance. Explain your symptoms to them and, if comfortable, invite them to attend appointments or rehabilitation with you. ? Joining a support group for people with chronic heart failure. Talking with other people who have the same symptoms may give you new ways of coping with your disease and your emotions.  Stay up to date with your shots (vaccines). Staying current on pneumococcal and influenza vaccines is especially important in preventing germs from attacking your airways (respiratory infections).  Keep all follow-up visits as told by your health care provider. This is important. How to recognize changes in your condition You and your family members need to know what changes to watch for in your condition. Watch for the following changes and report them to your health care provider:  Sudden weight gain. Ask your health care provider what amount of weight gain to report.  Shortness of breath: ? Feeling short of breath while at rest, with no exercise or activity that required great effort. ? Feeling breathless with activity.  Swelling of your lower legs or ankles.  Difficulty sleeping: ? You wake up feeling short of breath. ? You have to use more pillows to raise your head in order to sleep.  Frequent, dry, hacking cough.  Loss of appetite.  Feeling more tired all the time.  Depression or feelings of sadness or hopelessness.  Bloating in the stomach.  Where to find more information  Local support groups. Ask your health care provider about groups near you.  The American Heart Association: www.heart.org Contact a health care provider if:  You have a rapid weight gain.  You have increasing shortness of breath that is unusual for you.  You are unable to participate in your usual physical activities.  You tire  easily.  You cough more than normal, especially with physical activity.  You have any swelling or more swelling in areas such as your hands, feet, ankles, or abdomen.  You feel like your heart is beating quickly (palpitations).  You become dizzy or light-headed when you stand up. Get help right away if:  You have difficulty breathing.  You notice or your family notices a change in your awareness, such as having trouble staying awake or having difficulty with concentration.  You have pain or discomfort in your chest.  You have an episode of fainting (syncope). Summary  There is no cure for heart failure, so it is important for you to take good care of yourself and follow the treatment plan set by your health care provider.  Medicines are important in reducing your heart's workload, slowing the progression of heart failure, and improving your symptoms.  Living with chronic heart failure often leads to emotions such as fear, stress, anxiety, and depression. If you are feeling any of these emotions and need help coping, contact your health care provider. This information is not intended to replace advice given to you by your health care provider. Make sure you discuss any questions you  have with your health care provider. Document Released: 09/21/2016 Document Revised: 09/21/2016 Document Reviewed: 09/21/2016 Elsevier Interactive Patient Education  2018 [place].  Heart Failure Action Plan A heart failure action plan helps you understand what to do when you have symptoms of heart failure. Follow the plan that was created by you and your health care provider. Review your plan each time you visit your health care provider. Red zone These signs and symptoms mean you should get medical help right away:  You have trouble breathing when resting.  You have a dry cough that is getting worse.  You have swelling or pain in your legs or abdomen that is getting worse.  You suddenly gain  more than 2-3 lb (0.9-1.4 kg) in a day, or more than 5 lb (2.3 kg) in one week. This amount may be more or less depending on your condition.  You have trouble staying awake or you feel confused.  You have chest pain.  You do not have an appetite.  You pass out.  If you experience any of these symptoms:  Call your local emergency services (911 in the [place] right away or seek help at the emergency department of the nearest hospital.  Yellow zone These signs and symptoms mean your condition may be getting worse and you should make some changes:  You have trouble breathing when you are active or you need to sleep with extra pillows.  You have swelling in your legs or abdomen.  You gain 2-3 lb (0.9-1.4 kg) in one day, or 5 lb (2.3 kg) in one week. This amount may be more or less depending on your condition.  You get tired easily.  You have trouble sleeping.  You have a dry cough.  If you experience any of these symptoms:  Contact your health care provider within the next day.  Your health care provider may adjust your medicines.  Green zone These signs mean you are doing well and can continue what you are doing:  You do not have shortness of breath.  You have very little swelling or no new swelling.  Your weight is stable (no gain or loss).  You have a normal activity level.  You do not have chest pain or any other new symptoms.  Follow these instructions at home:  Take over-the-counter and prescription medicines only as told by your health care provider.  Weigh yourself daily. Your target weight is __________ lb (__________ kg). ? Call your health care provider if you gain more than __________ lb (__________ kg) in a day, or more than __________ lb (__________ kg) in one week.  Eat a heart-healthy diet. Work with a diet and nutrition specialist (dietitian) to create an eating plan that is best for you.  Keep all follow-up visits as told by your health care  provider. This is important. Where to find more information:  American Heart Association: www.heart.org Summary  Follow the action plan that was created by you and your health care provider.  Get help right away if you have any symptoms in the Red zone. This information is not intended to replace advice given to you by your health care provider. Make sure you discuss any questions you have with your health care provider. Document Released: 06/18/2016 Document Revised: 06/18/2016 Document Reviewed: 06/18/2016 Elsevier Interactive Patient Education  2018 [place] on my medicine - ELIQUIS (apixaban)  This medication education was reviewed with me or my healthcare representative as part of my discharge preparation.  The pharmacist that spoke with me during my hospital stay was:  Onnie Boer, RPH-CPP  Why was Eliquis prescribed for you? Eliquis was prescribed to treat blood clots that may have been found in the veins of your legs (deep vein thrombosis) or in your lungs (pulmonary embolism) and to reduce the risk of them occurring again.  What do You need to know about Eliquis ? The starting dose is 10 mg (two 5 mg tablets) taken TWICE daily for the FIRST SEVEN (7) DAYS, then on (enter date)  ***  the dose is reduced to ONE 5 mg tablet taken TWICE daily.  Eliquis may be taken with or without food.   Try to take the dose about the same time in the morning and in the evening. If you have difficulty swallowing the tablet whole please discuss with your pharmacist how to take the medication safely.  Take Eliquis exactly as prescribed and DO NOT stop taking Eliquis without talking to the doctor who prescribed the medication.  Stopping may increase your risk of developing a new blood clot.  Refill your prescription before you run out.  After discharge, you should have regular check-up appointments with your healthcare provider that is prescribing your Eliquis.    What do you  do if you miss a dose? If a dose of ELIQUIS is not taken at the scheduled time, take it as soon as possible on the same day and twice-daily administration should be resumed. The dose should not be doubled to make up for a missed dose.  Important Safety Information A possible side effect of Eliquis is bleeding. You should call your healthcare provider right away if you experience any of the following: ? Bleeding from an injury or your nose that does not stop. ? Unusual colored urine (red or dark brown) or unusual colored stools (red or black). ? Unusual bruising for unknown reasons. ? A serious fall or if you hit your head (even if there is no bleeding).  Some medicines may interact with Eliquis and might increase your risk of bleeding or clotting while on Eliquis. To help avoid this, consult your healthcare provider or pharmacist prior to using any new prescription or non-prescription medications, including herbals, vitamins, non-steroidal anti-inflammatory drugs (NSAIDs) and supplements.  This website has more information on Eliquis (apixaban): http://[email or web address].[email or web address]/home

## 2018-03-07 ENCOUNTER — Ambulatory Visit
Admit: 2018-03-07 | Discharge: 2018-03-07 | Disposition: A | Discharge status: Home / Self Care | Payer: Medicare Other | Source: Ambulatory Visit | Attending: Radiation Oncology | Admitting: Radiation Oncology

## 2018-03-07 ENCOUNTER — Institutional Professional Consult (permissible substitution): Payer: Medicare Other | Admitting: Radiation Oncology

## 2018-03-07 ENCOUNTER — Other Ambulatory Visit: Payer: Self-pay

## 2018-03-07 ENCOUNTER — Ambulatory Visit
Admission: RE | Admit: 2018-03-07 | Discharge: 2018-03-07 | Disposition: A | Discharge status: Home / Self Care | Payer: Medicare Other | Source: Ambulatory Visit | Attending: Radiation Oncology | Admitting: Radiation Oncology

## 2018-03-07 ENCOUNTER — Other Ambulatory Visit: Payer: Self-pay | Admitting: Radiation Oncology

## 2018-03-07 ENCOUNTER — Ambulatory Visit: Payer: Medicare Other

## 2018-03-07 ENCOUNTER — Encounter: Payer: Self-pay | Admitting: Radiation Oncology

## 2018-03-07 ENCOUNTER — Other Ambulatory Visit: Payer: Self-pay | Admitting: Radiation Therapy

## 2018-03-07 DIAGNOSIS — Z96641 Presence of right artificial hip joint: Secondary | ICD-10-CM | POA: Diagnosis not present

## 2018-03-07 DIAGNOSIS — C7931 Secondary malignant neoplasm of brain: Secondary | ICD-10-CM | POA: Insufficient documentation

## 2018-03-07 DIAGNOSIS — C3491 Malignant neoplasm of unspecified part of right bronchus or lung: Secondary | ICD-10-CM | POA: Insufficient documentation

## 2018-03-07 DIAGNOSIS — Z87891 Personal history of nicotine dependence: Secondary | ICD-10-CM | POA: Diagnosis not present

## 2018-03-07 DIAGNOSIS — I10 Essential (primary) hypertension: Secondary | ICD-10-CM | POA: Diagnosis not present

## 2018-03-07 DIAGNOSIS — C7949 Secondary malignant neoplasm of other parts of nervous system: Principal | ICD-10-CM

## 2018-03-07 DIAGNOSIS — Z79899 Other long term (current) drug therapy: Secondary | ICD-10-CM | POA: Insufficient documentation

## 2018-03-07 DIAGNOSIS — Z7901 Long term (current) use of anticoagulants: Secondary | ICD-10-CM | POA: Insufficient documentation

## 2018-03-07 MED ORDER — DIAZEPAM 5 MG PO TABS: ORAL_TABLET | ORAL | 0 refills | Status: DC

## 2018-03-07 MED ORDER — FLUCONAZOLE 100 MG PO TABS: ORAL_TABLET | ORAL | 0 refills | Status: DC

## 2018-03-07 NOTE — Discharge Summary (Signed)
[facility] Discharge Summary   Patient: Bradley Maldonado GQB:[id number]   PCP: [name], NP-C DOB: Sep 11, 1950   Date of admission: 03/01/2018   Date of discharge: 03/06/2018     Discharge Diagnoses:  Active Problems:   Pleural effusion, malignant   Hypertension   Lung cancer ([place])   Acute combined systolic and diastolic heart failure ([place])   Admitted From: home Disposition:  home  Recommendations for Outpatient Follow-up:  1. Please follow up with PCP in 1 wee, oncology in 1 week, radiation oncology in 1 week.   Follow-up Information    Henson, Vickie L, NP-C. Schedule an appointment as soon as possible for a visit on 03/12/2018.   Specialty:  Family Medicine Why:  11:30 bring proof of income, like bank statement or soc sec statement.  for eliquis patient assistance . Contact information: [place] [address] [phone]        Curt Bears, MD. Schedule an appointment as soon as possible for a visit in 1 week(s).   Specialty:  Oncology Contact information: [place] [address] [phone]        [facility] [employer]. Schedule an appointment as soon as possible for a visit in 2 week(s).   Specialty:  Cardiology Contact information: [address], [place] Follow up.   Why:  oxygen Contact information: [place] [employer] [address] [facility] Follow up.   Specialty:  Home Health Services Why:  [facility] for medication management. Contact information: [address] [place] [address] [phone]          Diet recommendation: cardiac diet  Activity: The patient is advised to gradually reintroduce usual activities.  Discharge Condition: good  Code Status: full code  History of present illness: As per the H and P  dictated on admission, "Bradley Maldonado  is a 67 y.o. male, with past medical history significant for stage IV non-small cell lung cancer on the right with malignant effusion and history of COPD presenting with 1 week history of increasing shortness of breath, no fever or chest pain , but reports occasional hemoptysis.  No signs or symptoms of DVT.  Patient was started on 5 L/min by nasal cannula although he is on room air at baseline.  Patient could not tolerate CT of the chest due to severe orthopnea.  Patient is being followed by Dr. Earlie Server by weekly treatments with [facility] immunotherapy.  Last treatment was last Thursday.  Portable chest x-ray showed right lung opacification with effusion and atelectasis.  Patient has a history of right-sided chest tube removed in 2018 by Dr. Darcey Nora.  Patient is scheduled to have thoracentesis right now by Dr. Laurence Ferrari and will be evaluated by cardiothoracic surgery."  Hospital Course:  Summary of his active problems in the hospital is as following. 1.  Acute hypoxic respiratory failure. Malignant pleural effusion Acute left pulmonary embolism Hemoptysis  Known history of COPD as well as right-sided lung cancer but now started to develop 1 week of shortness of breath some hemoptysis orthopnea and PND. Prefer to laying on the right side. Currently requiring 6 L of oxygen from low oxygen at home. Chest x-ray shows opacification of the right lung.  And there was some concern for pleural effusion. Cardiothoracic was consulted for Pleurx catheter placement  but they felt that the patient will not get any benefit out of the Pleurx catheter. IR was consulted for thoracentesis although ultrasound was negative for any significant fluid that they can drain. Patient does have large fluid collection with thick necrotic rim around it consistent with his malignancy. CT chest PE protocol was performed which is positive for left upper lobe obstructive emboli and left  middle lobe emboli as well these are not large enough to explain the degree of the hypoxia and the patient has. There is a possibility that the patient may have pneumonitis from him being on Keytruda and I will start the patient on 1 mg/kg steroids.  This was discussed with on-call oncology. 70 mg oral prednisone, equivalent dose is 56 mg of IV Solu-Medrol, 11 mg of Decadron. Currently will continue 4 mg 3 times daily Decadron followed by weekly taper to 4 mg daily. Completed oral azithromycin for now Although no evidence of new consolidation and a CT scan of the chest. Continue pain control with oral narcotics. We started the patient on IV heparin after discussion.  Now that he is tolerating it very well we transition him to oral Eliquis. Given his history of hemoptysis patient received lower bolus. Pharmacy consulted.  2.  History of COPD. Continue nebulizer.  Added steroids.  Patient does not have any significant bilateral wheezing concerning for active COPD exacerbation.  3.  Essential hypertension. Patient is on amlodipine 5 mg daily. Blood pressure was significantly elevated initially but now appears to have well controlled. meds were changed for cardiomyopathy.   4.  Dyspnea with hypoxia. Acute systolic CHF.   New diagnosis. Possible keytruda induced myocarditis.  I do appreciate cardiomegaly on the CT scan. Echocardiogram was done which shows 20 to 25% EF.  No pericardial effusion.  Appreciate cardiology input.  Medications adjusted.  No plan for ischemic work-up.  5.  Metastatic non small cell cancer. Exophthalmos. Prior to transitioning the patient to Eliquis CT of the head with and without contrast as well as CT of the orbit were performed which were requested by ophthalmology. No acute abnormality in the orbit were identified that requires a surgical correction and therefore patient was transitioned to oral Eliquis. On the other hand CT head is positive for multiple  metastatic lesions. Discussed with radiation oncology, appreciate their consultation. Pt refused MRI inpatient twice, rad-onc will arrange outpatient.  Discussed with oncology, recommend outpatient follow-up. Already on IV Solu-Medrol for vasogenic edema seen on the CT scan.  Transition to Decadron.  All other chronic medical condition were stable during the hospitalization.  Patient was seen by physical therapy, who recommended home health, which was arranged by Education officer, museum and case Freight forwarder. On the day of the discharge the patient's vitals were stable, and no other acute medical condition were reported by patient. the patient was felt safe to be discharge at home with home health.  Consultants: cardiology, cardiothoracic surgery, IR< oncology, radiation oncology Procedures: Echocardiogram   DISCHARGE MEDICATION: Allergies as of 03/06/2018   No Known Allergies     Medication List    STOP taking these medications   amLODipine 5 MG tablet Commonly known as:  NORVASC   azithromycin 250 MG tablet Commonly known as:  ZITHROMAX     TAKE these medications   carvedilol 3.125 MG tablet Commonly known as:  COREG Take 1 tablet (3.125 mg total) by mouth 2 (two) times daily with a meal.   dexamethasone 4 MG tablet Commonly known as:  DECADRON Take  4mg  3 times daily for 7days,Take 4mg  2 times daily for 7days,Take 4mg  daily till seen by Dr Julien Nordmann.   ELIQUIS STARTER PACK 5 MG Tabs Take as directed on package: start with two-5mg  tablets twice daily for 7 days. On day 8, switch to one-5mg  tablet twice daily.   HYDROcodone-acetaminophen 5-325 MG tablet Commonly known as:  NORCO/VICODIN Take 1 tablet by mouth every 4 (four) hours as needed for up to 5 days for moderate pain.   losartan 25 MG tablet Commonly known as:  COZAAR Take 1 tablet (25 mg total) by mouth daily.   polyethylene glycol packet Commonly known as:  MIRALAX / GLYCOLAX Take 17 g by mouth daily.   senna-docusate  8.6-50 MG tablet Commonly known as:  Senokot-S Take 1 tablet by mouth at bedtime.      No Known Allergies Discharge Instructions    Diet - low sodium heart healthy   Complete by:  As directed    Discharge instructions   Complete by:  As directed    It is important that you read following instructions as well as go over your medication list with RN to help you understand your care after this hospitalization.  Discharge Instructions:  Please follow-up with PCP in one week  Please request your primary care physician to go over all Hospital Tests and Procedure/Radiological results at the follow up,  Please get all Hospital records sent to your PCP by signing hospital release before you go home.   Do not drive, operating heavy machinery, perform activities at heights, swimming or participation in water activities or provide baby sitting services while you are on Pain, Sleep and Anxiety Medications; until you have been seen by Primary Care Physician or a Neurologist and advised to do so again. Do not take more than prescribed Pain, Sleep and Anxiety Medications. You were cared for by a hospitalist during your hospital stay. If you have any questions about your discharge medications or the care you received while you were in the hospital after you are discharged, you can call the unit and ask to speak with the hospitalist on call if the hospitalist that took care of you is not available.  Once you are discharged, your primary care physician will handle any further medical issues. Please note that NO REFILLS for any discharge medications will be authorized once you are discharged, as it is imperative that you return to your primary care physician (or establish a relationship with a primary care physician if you do not have one) for your aftercare needs so that they can reassess your need for medications and monitor your lab values. You Must read complete instructions/literature along with all the  possible adverse reactions/side effects for all the Medicines you take and that have been prescribed to you. Take any new Medicines after you have completely understood and accept all the possible adverse reactions/side effects. Wear Seat belts while driving. If you have smoked or chewed Tobacco in the last 2 yrs please stop smoking and/or stop any Recreational drug use.   Increase activity slowly   Complete by:  As directed    Increase activity slowly   Complete by:  As directed      Discharge Exam: Filed Weights   03/01/18 0657  Weight: 69 kg   Vitals:   03/06/18 0918 03/06/18 1116  BP:    Pulse:    Resp:    Temp:    SpO2: 91% 91%   General: Appear in no distress, no  Rash; Oral Mucosa moist. Cardiovascular: S1 and S2 Present, no Murmur, no JVD Respiratory: Bilateral Air entry present and bilateral  Crackles, no wheezes Abdomen: Bowel Sound present, Soft and no tenderness Extremities: no Pedal edema, no calf tenderness Neurology: Grossly no focal neuro deficit.  The results of significant diagnostics from this hospitalization (including imaging, microbiology, ancillary and laboratory) are listed below for reference.    Significant Diagnostic Studies: X-ray Chest Pa And Lateral  Result Date: 03/02/2018 CLINICAL DATA:  Pleural effusion, cough, shortness of breath, history RIGHT lung cancer EXAM: CHEST - 2 VIEW COMPARISON:  03/01/2018 FINDINGS: Normal heart size, mediastinal contours, and pulmonary vascularity. Chronic interstitial prominence in the LEFT lung. Persistent RIGHT pleural effusion and basilar atelectasis, patient with known tumor in inferior RIGHT hemithorax. Diffuse infiltrate throughout remaining aerated RIGHT lung; this could represent infection or lymphangitic tumor spread. No discrete LEFT lung mass or pneumothorax. Bones demineralized. IMPRESSION: Persistent opacification of the inferior half of the RIGHT hemithorax by combination of pleural effusion, atelectasis  and known tumor. Persistent chronic infiltrate in residual aerated RIGHT lung with scattered interstitial infiltrates in the LEFT lung, could represent infection though lymphangitic tumor spread could cause a similar pattern. Electronically Signed   By: Lavonia Dana M.D.   On: 03/02/2018 10:44   Ct Head W & Wo Contrast  Addendum Date: 03/06/2018   ADDENDUM REPORT: 03/06/2018 07:51 ADDENDUM: Clinical data as stated in the history was entered incorrectly by the technologist in the study notes. Patient has Adenocarcinoma of the RIGHT lung, stage IV, not small cell carcinoma. Electronically Signed   By: Staci Righter M.D.   On: 03/06/2018 07:51   Result Date: 03/06/2018 CLINICAL DATA:  Exophthalmos.  Small cell lung cancer. EXAM: CT HEAD AND ORBITS WITHOUT AND WITH CONTRAST TECHNIQUE: Contiguous axial images were obtained from the base of the skull through the vertex with and without contrast. Multidetector CT imaging of the orbits was performed using the standard protocol with and without intravenous contrast. CONTRAST:  2mL OMNIPAQUE IOHEXOL 300 MG/ML  SOLN COMPARISON:  MR head 12/20/2016. FINDINGS: CT HEAD FINDINGS Brain: No acute stroke or hemorrhage. There are multiple intracranial mass lesions consistent with metastases, which demonstrate homogeneous postcontrast enhancement. The largest measures 8 mm, LEFT periventricular location. BILATERAL frontal, parietal, and occipital metastases are observed. None are seen in the cerebellum or brainstem. Vasogenic edema is greatest in the LEFT occipital lobe. Vascular: Visible vessels are patent. Vascular calcification consistent with intracranial atherosclerotic disease. Skull: No calvarial destructive lesions. Other: None. CT ORBITS FINDINGS Orbits: No traumatic or inflammatory finding. Globes, optic nerves, orbital fat, extraocular muscles, vascular structures, and lacrimal glands are normal. Symmetric proptosis, uncertain significance. Doubt increased  intracranial pressure. Visualized sinuses: Clear. Soft tissues: Unremarkable. IMPRESSION: CT head without and with contrast demonstrates BILATERAL subcentimeter supratentorial intra-axial metastases, consistent with the pattern frequently seen in small cell lung cancer. No midline shift, but asymmetric vasogenic edema is most pronounced in the LEFT occipital lobe. Detection of all metastases is more readily accomplished using pre and postcontrast MRI of the brain, a modality which displays increased sensitivity to smaller metastatic deposits. No osseous metastatic disease. 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 Significance uncertain. A call has been placed to the ordering provider. Electronically Signed: By: Staci Righter M.D. On: 03/04/2018 17:20   Ct Angio Chest Pe W Or Wo Contrast  Result Date: 03/02/2018 CLINICAL DATA:  New onset hypoxemia. History of metastatic non-small cell lung cancer. EXAM: CT ANGIOGRAPHY CHEST WITH CONTRAST TECHNIQUE: Multidetector  CT imaging of the chest was performed using the standard protocol during bolus administration of intravenous contrast. Multiplanar CT image reconstructions and MIPs were obtained to evaluate the vascular anatomy. CONTRAST:  169mL ISOVUE-370 IOPAMIDOL (ISOVUE-370) INJECTION 76% COMPARISON:  CT chest dated January 31, 2018. FINDINGS: Cardiovascular: New acute lobar and segmental pulmonary emboli involving the left upper lobe and anterior segment. Emboli in the left upper lobe anterior segment are occlusive. Nonocclusive emboli in subsegmental superior lingular branches. No left lower lobe or right-sided pulmonary emboli. Normal RV/LV ratio of 0.84. Normal heart size. No pericardial effusion. Normal caliber thoracic aorta. Coronary, aortic arch, and branch vessel atherosclerotic vascular disease. Mediastinum/Nodes: Unchanged mild right axillary lymphadenopathy measuring up to 1.1 cm. No left axillary lymphadenopathy. Unchanged enlarged 1.0 cm  right cardiophrenic lymph node. Previously seen enlarged aortopulmonary window, subcarinal, and right hilar lymph nodes are not as well visualized due to phase of contrast, but appear grossly unchanged. The thyroid gland, trachea, and esophagus demonstrate no significant findings. Lungs/Pleura: Stable volume loss in the right hemithorax. Unchanged irregular prominent right-sided pleural thickening and hyperenhancement with small loculated dependent and subpulmonic right pleural effusion. Unchanged interlobular septal thickening throughout the right lung. Stable consolidation throughout much of the right middle and right lower lobes. Numerous pulmonary nodules scattered throughout the left lung are stable to slightly increased in size. For example a posterior left upper lobe nodule now measures 9 mm, previously 7 mm. Severe centrilobular emphysema and mild diffuse bronchial wall thickening again noted. No pneumothorax. Upper Abdomen: No acute abnormality. Musculoskeletal: Unchanged small lytic metastases involving the right posterolateral eighth rib and right posterior levin through. Review of the MIP images confirms the above findings. IMPRESSION: 1. New acute lobar and segmental pulmonary emboli involving the left upper lobe. Emboli in the left upper lobe anterior segment are occlusive. No evidence of right heart strain. 2. Unchanged extensive circumferential tumor in the right pleural space. Unchanged lymphangitic carcinomatosis and consolidative tumor/atelectasis throughout the right lung. Unchanged loculated subpulmonic right pleural effusion. 3. Stable to mildly increased left lung metastases. 4. Stable mediastinal, right hilar, and right axillary lymphadenopathy. 5.  Emphysema (ICD10-J43.9). 6.  Aortic atherosclerosis (ICD10-I70.0). Critical Value/emergent results were called by telephone at the time of interpretation on 03/02/2018 at 1:47 pm to Dr. Berle Mull , who verbally acknowledged these results.  Electronically Signed   By: Titus Dubin M.D.   On: 03/02/2018 13:47   Dg Chest Portable 1 View  Result Date: 03/01/2018 CLINICAL DATA:  Worsening shortness of breath for 2 days, history lung cancer, hypertension EXAM: PORTABLE CHEST 1 VIEW COMPARISON:  Portable exam 0707 hours compared to CT chest of 01/31/2018 FINDINGS: Subtotal opacification of the RIGHT hemithorax by combination of pleural effusion and tumor as well as RIGHT lung atelectasis. Mediastinal shift to RIGHT. Normal heart size and pulmonary vascularity. Scattered chronic accentuation of interstitial markings in the LEFT lung. The discrete pulmonary metastases seen in the LEFT lung on the prior CT exam are less well delineated radiographically. No segmental infiltrate or pleural effusion in the LEFT lung. No pneumothorax. Bones demineralized. IMPRESSION: Chronic subtotal opacification of RIGHT hemithorax by known tumor, effusion, and atelectasis. Chronic accentuation of interstitial markings in LEFT lung without superimposed acute infiltrate. Poorly visualized LEFT lung metastases as noted on prior CT. Electronically Signed   By: Lavonia Dana M.D.   On: 03/01/2018 07:41   Ir US Chest  Result Date: 03/01/2018 CLINICAL DATA:  67 year old male with a history of right-sided lung cancer nearly completely  replacing the right lung with central necrosis at the lung base. He presents with increased shortness of breath. Evaluate for pleural fluid for possible thoracentesis. EXAM: CHEST ULTRASOUND COMPARISON:  Most recent prior CT scan of the chest 01/31/2018 FINDINGS: No free pleural fluid. There is fluid within the centrally necrotic right lower lobe, however this is surrounded by a thick rind of tumor tissue and would not result in any further aeration of the lung. Thoracentesis was not performed. IMPRESSION: Negative for free pleural fluid.  Thoracentesis was not performed. There is fluid within the centrally necrotic right lower lobe pulmonary  mass, however this is surrounded by a thick rind of tumor and is not amenable to thoracentesis. Electronically Signed   By: Jacqulynn Cadet M.D.   On: 03/01/2018 16:15   Ct Orbits W Contrast  Addendum Date: 03/06/2018   ADDENDUM REPORT: 03/06/2018 07:51 ADDENDUM: Clinical data as stated in the history was entered incorrectly by the technologist in the study notes. Patient has Adenocarcinoma of the RIGHT lung, stage IV, not small cell carcinoma. Electronically Signed   By: Staci Righter M.D.   On: 03/06/2018 07:51   Result Date: 03/06/2018 CLINICAL DATA:  Exophthalmos.  Small cell lung cancer. EXAM: CT HEAD AND ORBITS WITHOUT AND WITH CONTRAST TECHNIQUE: Contiguous axial images were obtained from the base of the skull through the vertex with and without contrast. Multidetector CT imaging of the orbits was performed using the standard protocol with and without intravenous contrast. CONTRAST:  75mL OMNIPAQUE IOHEXOL 300 MG/ML  SOLN COMPARISON:  MR head 12/20/2016. FINDINGS: CT HEAD FINDINGS Brain: No acute stroke or hemorrhage. There are multiple intracranial mass lesions consistent with metastases, which demonstrate homogeneous postcontrast enhancement. The largest measures 8 mm, LEFT periventricular location. BILATERAL frontal, parietal, and occipital metastases are observed. None are seen in the cerebellum or brainstem. Vasogenic edema is greatest in the LEFT occipital lobe. Vascular: Visible vessels are patent. Vascular calcification consistent with intracranial atherosclerotic disease. Skull: No calvarial destructive lesions. Other: None. CT ORBITS FINDINGS Orbits: No traumatic or inflammatory finding. Globes, optic nerves, orbital fat, extraocular muscles, vascular structures, and lacrimal glands are normal. Symmetric proptosis, uncertain significance. Doubt increased intracranial pressure. Visualized sinuses: Clear. Soft tissues: Unremarkable. IMPRESSION: CT head without and with contrast demonstrates  BILATERAL subcentimeter supratentorial intra-axial metastases, consistent with the pattern frequently seen in small cell lung cancer. No midline shift, but asymmetric vasogenic edema is most pronounced in the LEFT occipital lobe. Detection of all metastases is more readily accomplished using pre and postcontrast MRI of the brain, a modality which displays increased sensitivity to smaller metastatic deposits. No osseous metastatic disease. 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 Significance uncertain. A call has been placed to the ordering provider. Electronically Signed: By: Staci Righter M.D. On: 03/04/2018 17:20    Microbiology: Recent Results (from the past 240 hour(s))  Culture, blood (routine x 2)     Status: None (Preliminary result)   Collection Time: 03/03/18  6:47 AM  Result Value Ref Range Status   Specimen Description BLOOD RIGHT WRIST  Final   Special Requests   Final    BOTTLES DRAWN AEROBIC ONLY Blood Culture adequate volume   Culture   Final    NO GROWTH 4 DAYS Performed at [facility], [address], [place], [place] [address]    Report Status PENDING  Incomplete  Culture, blood (routine x 2)     Status: None (Preliminary result)   Collection Time: 03/03/18  6:50 AM  Result Value Ref Range Status   Specimen Description BLOOD LEFT WRIST  Final   Special Requests   Final    BOTTLES DRAWN AEROBIC AND ANAEROBIC Blood Culture adequate volume   Culture   Final    NO GROWTH 4 DAYS Performed at [facility], [address], [place], [place] [address]    Report Status PENDING  Incomplete     Labs: CBC: Recent Labs  Lab 03/01/18 0701 03/02/18 0308 03/02/18 2140 03/04/18 0347 03/05/18 0402  WBC 6.5 7.6 7.6 9.3 7.5  HGB 15.6 14.0 14.0 14.3 14.3  HCT 50.8 44.6 47.4 46.9 47.8  MCV 88.7 87.3 90.3 89.3 89.3  PLT 184 179 191 203 962   Basic Metabolic Panel: Recent Labs  Lab 03/02/18 0308 03/03/18 0647 03/04/18 0347 03/05/18 0402 03/06/18 0348    NA 139 138 138 140 139  K 3.8 4.5 4.1 4.4 4.0  CL 102 102 101 104 100  CO2 27 27 28 29 28   GLUCOSE 139* 158* 127* 132* 208*  BUN 13 10 11 12 14   CREATININE 0.77 0.76 0.72 0.71 0.71  CALCIUM 9.0 8.9 9.0 9.1 9.1  MG  --  2.1  --   --   --    Liver Function Tests: No results for input(s): AST, ALT, ALKPHOS, BILITOT, PROT, ALBUMIN in the last 168 hours. No results for input(s): LIPASE, AMYLASE in the last 168 hours. No results for input(s): AMMONIA in the last 168 hours. Cardiac Enzymes: No results for input(s): CKTOTAL, CKMB, CKMBINDEX, TROPONINI in the last 168 hours. BNP (last 3 results) Recent Labs    03/01/18 0701  BNP 33.9   CBG: No results for input(s): GLUCAP in the last 168 hours. Time spent: 35 minutes  Signed:  Berle Mull  [facility] 03/06/2018 , 9:38 PM

## 2018-03-08 LAB — CULTURE, BLOOD (ROUTINE X 2)
CULTURE: NO GROWTH
Culture: NO GROWTH
Special Requests: ADEQUATE
Special Requests: ADEQUATE

## 2018-03-09 ENCOUNTER — Telehealth: Payer: Self-pay | Admitting: Family Medicine

## 2018-03-09 NOTE — Telephone Encounter (Signed)
Called pt and reminded him of appt and asked him to bring proof of income for Pt Assistance application for Eliquis

## 2018-03-09 NOTE — Telephone Encounter (Signed)
Ronny [place] called from [facility] and stated she needs   Verbal order for skill nurising 2x a week for 2 and 1x a Week for 1 week. Please advise     [phone] or [phone]  vm can be left with verbal orders.

## 2018-03-10 NOTE — Telephone Encounter (Signed)
Please take care of this for patient. Thanks

## 2018-03-12 ENCOUNTER — Telehealth: Payer: Self-pay | Admitting: Family Medicine

## 2018-03-12 ENCOUNTER — Ambulatory Visit (INDEPENDENT_AMBULATORY_CARE_PROVIDER_SITE_OTHER): Payer: Medicare Other | Admitting: Family Medicine

## 2018-03-12 VITALS — BP 130/80 | HR 90 | Temp 97.7°F | Resp 16 | Wt 149.0 lb

## 2018-03-12 DIAGNOSIS — I2699 Other pulmonary embolism without acute cor pulmonale: Secondary | ICD-10-CM

## 2018-03-12 DIAGNOSIS — C3491 Malignant neoplasm of unspecified part of right bronchus or lung: Secondary | ICD-10-CM

## 2018-03-12 DIAGNOSIS — I5041 Acute combined systolic (congestive) and diastolic (congestive) heart failure: Secondary | ICD-10-CM

## 2018-03-12 DIAGNOSIS — Z09 Encounter for follow-up examination after completed treatment for conditions other than malignant neoplasm: Secondary | ICD-10-CM | POA: Diagnosis not present

## 2018-03-12 DIAGNOSIS — J910 Malignant pleural effusion: Secondary | ICD-10-CM

## 2018-03-12 DIAGNOSIS — I10 Essential (primary) hypertension: Secondary | ICD-10-CM

## 2018-03-12 NOTE — Telephone Encounter (Signed)
natasha notified

## 2018-03-12 NOTE — Telephone Encounter (Signed)
Working on [employer] application with pt,  Elenor Legato is going to get copy of income and statement from pharmacy and bring to me

## 2018-03-12 NOTE — Patient Instructions (Addendum)
Continue on your current medications until you see Dr. Julien Nordmann on Thursday.

## 2018-03-12 NOTE — Progress Notes (Signed)
Subjective:    Patient ID: Bradley Maldonado, male    DOB: 1950-10-31, 67 y.o.   MRN: [id number]  HPI Chief Complaint  Patient presents with  . hospital follow-up    follow-up on blood clot- breathing. needs patient assistance   [name] here for a hospital discharge follow up. Hospitalized from 03/01/2018 to 03/06/2018 for shortness of breath in the setting of stage IV non-small cell lung cancer on the right with malignant effusion and COPD. He was diagnosed with acute PE and is now on Eliquis.   Did not bring in proof of financial records so that our office could assist him with the medication.  Reports feeling "good" considering his recent diagnosis. States his aunt drove him here today.  He was discharged on oxygen and reports using this at all times. [name] wearing it today.  States he has been getting around his house fine and even cooking for himself.   States he has a home health nurse that started last Friday and will return tomorrow.  He has no concerns and is able to tell me his recent medication changes.   He lives alone but states he has friends that checks on him. States his kids visit him.   States he has been taking Tylenol arthritis for shoulder pain. This is a chronic pain and worse when sleeping on his right side.  States he is not taking Hydrocodone. States he is not in any real pain.   Denies fever, chills, headache, dizziness, chest pain, palpitations, abdominal pain, N/V/D, LE edema.   Denies bleeding.   Past Medical History:  Diagnosis Date  . Adenocarcinoma of right lung, stage 4 ([place]) 01/24/2017  . Goals of care, counseling/discussion 01/24/2017  . Hypertension    Past Surgical History:  Procedure Laterality Date  . CHEST TUBE INSERTION Right 02/06/2017   Procedure: INSERTION PLEURAL DRAINAGE CATHETER;  Surgeon: Ivin Poot, MD;  Location: [facility];  Service: Thoracic;  Laterality: Right;  . EYE SURGERY Right    metal removed from eye  . Hip replacement     . IR THORACENTESIS ASP PLEURAL SPACE W/IMG GUIDE  12/19/2016  . REMOVAL OF PLEURAL DRAINAGE CATHETER Right 03/21/2017   Procedure: REMOVAL OF RIGHT PLEURAL DRAINAGE CATHETER;  Surgeon: Ivin Poot, MD;  Location: [facility];  Service: Thoracic;  Laterality: Right;   Reviewed allergies, medications, past medical, surgical, family, and social history.   Review of Systems Pertinent positives and negatives in the history of present illness.     Objective:   Physical Exam  Constitutional: [name] oriented to person, place, and time. He appears well-developed. No distress.  HENT:  Mouth/Throat: Oropharynx is clear and moist.  Eyes: Pupils are equal, round, and reactive to light. Conjunctivae are normal.  Cardiovascular: Normal rate and regular rhythm.  Pulmonary/Chest: Effort normal. No respiratory distress. He has no wheezes.  Neurological: [name] alert and oriented to person, place, and time. He has normal strength. Gait normal.  Psychiatric: He has a normal mood and affect. His speech is normal.   BP 130/80   Pulse 90   Temp 97.7 F (36.5 C) (Oral)   Resp 16   Wt 149 lb (67.6 kg)   SpO2 91%   BMI 21.38 kg/m       Assessment & Plan:  Hospital discharge follow-up  Acute pulmonary embolism, unspecified pulmonary embolism type, unspecified whether acute cor pulmonale present ([place])  Acute combined systolic and diastolic heart failure ([place])  Hypertension, unspecified type  Pleural effusion, malignant  Adenocarcinoma of right lung, stage 4 ([place])  He reportedly feels much better since his discharge. He appears upbeat and has a positive outlook but states he does understand the seriousness of his diagnosis. States his daughter is his HCPOA however, he does not have this in writing. Counseling done on the need for advance directives. [name] a full code and states he has "things to do".  He will continue taking all medications until he follows up with his oncologist later this week.  Medications were reconciled.  We reviewed discharge notes, labs and imaging results.  On Eliquis for PE and no sign of bleeding.  Doing well on oxygen.  Home health nurse will continue seeing him.  HTN- BP is at goal range.  CHF with an EF 20-25% does not have follow up with cardiology.  Follow up with oncologist Thursday.

## 2018-03-12 NOTE — Progress Notes (Signed)
Radiation Oncology         [phone] ________________________________  Initial outpatient Consultation  Name: Bradley Maldonado MRN: [id number]  Date: 03/07/2018  DOB: December 01, 1950  [id number], Laurian Brim, NP-C  Lavina Hamman, MD   REFERRING PHYSICIAN: Lavina Hamman, MD  DIAGNOSIS: The encounter diagnosis was Brain metastases [facility]).   ICD-10-CM   1. Brain metastases ([place]) C79.31   stage IV non-small cell lung cancer, adenocarcinoma   HISTORY OF PRESENT ILLNESS::Bradley Maldonado is a 67 y.o. male who had been undergoing treatment with Dr Earlie Server for known stage IV non-small cell lung cancer, adenocarcinoma with PDL 1 expression of 50% and negative for actionable mutations. The patient has been undergoing treatment with immunotherapy with Keytruda.  He was admitted to the hospital recently due to SOB  - CT angio showed new acute lobar and segmental pulmonary emboli involving the left upper lobe. Emboli in the left upper lobe anterior segment are occlusive. No evidence of right heart strain.  Unchanged extensive circumferential tumor in the right pleural space. Unchanged lymphangitic carcinomatosis and consolidative tumor/atelectasis throughout the right lung. Unchanged loculated subpulmonic right pleural effusion. Stable to mildly increased left lung metastases. Stable mediastinal, right hilar, and right axillary lymphadenopathy.  [place] was Negative for free pleural fluid.  Thoracentesis was not performed. There is fluid within the centrally necrotic right lower lobe pulmonary mass, however this is surrounded by a thick rind of tumor and is not amenable to thoracentesis per IR.  During his hospital stay it was noted that he had deficits in his left eye movements and exophthalmos .  Patient reports that left eye drifting/mobility deficits are chronic. CT head/orbits without and with contrast demonstrates BILATERAL subcentimeter supratentorial intra-axial metastases, consistent with the pattern  frequently seen in small cell lung cancer. No midline shift, but asymmetric vasogenic edema is most pronounced in the LEFT occipital lobe.BILATERAL globe proptosis, without orbital mass. Significance Uncertain.    Free T4 and TSH were normal in August  Dose of Decadron, if applicable: Decadron taper - currently 4 mg three times daily.   Recent neurologic symptoms, if any:   Seizures: No  Headaches: No  Nausea: No  Dizziness/ataxia: Yes, he reports mild dizziness when turning his head.   Difficulty with hand coordination: No  Focal numbness/weakness: [name] weak today. He reports becoming short of breath with any type of activity.   Visual deficits/changes: No  Confusion/Memory deficits: No  Painful bone metastases at present, if any: N/A  SAFETY ISSUES:  Prior radiation? No  Pacemaker/ICD? No  Possible current pregnancy? No  Is the patient on methotrexate? No  Patient reports that his left eye drifts (movements are not intact) but this was present chronically and existed at time of MRI brain in July 2018 which was negative.  [name] s/p right hip replacement.  He reports occasional hemoptysis with deep coughing - no more than a quarter sized clot at a time, but usually less.  He lives alone in [place]. Has been referred to [facility] for assistance.  PREVIOUS RADIATION THERAPY: No  PAST MEDICAL HISTORY:  has a past medical history of Adenocarcinoma of right lung, stage 4 ([place]) (01/24/2017), Goals of care, counseling/discussion (01/24/2017), and Hypertension.    PAST SURGICAL HISTORY: Past Surgical History:  Procedure Laterality Date  . CHEST TUBE INSERTION Right 02/06/2017   Procedure: INSERTION PLEURAL DRAINAGE CATHETER;  Maldonado: Bradley Poot, MD;  Location: [place];  Service: Thoracic;  Laterality: Right;  . EYE  SURGERY Right    metal removed from eye  . Hip replacement    . IR THORACENTESIS ASP PLEURAL SPACE W/IMG GUIDE  12/19/2016  . REMOVAL OF PLEURAL DRAINAGE  CATHETER Right 03/21/2017   Procedure: REMOVAL OF RIGHT PLEURAL DRAINAGE CATHETER;  Maldonado: Bradley Poot, MD;  Location: [place];  Service: Thoracic;  Laterality: Right;    FAMILY HISTORY: family history includes Cancer in his father; Cancer - Lung in his father; Diabetes in his mother; Lung cancer in his father.  SOCIAL HISTORY:  reports that he quit smoking about 7 weeks ago. He has never used smokeless tobacco. He reports that he drinks about 14.0 standard drinks of alcohol per week. He reports that he has current or past drug history. Drugs: Marijuana and Cocaine.  ALLERGIES: Patient has no known allergies.  MEDICATIONS:  Current Outpatient Medications  Medication Sig Dispense Refill  . carvedilol (COREG) 3.125 MG tablet Take 1 tablet (3.125 mg total) by mouth 2 (two) times daily with a meal. 60 tablet 0  . dexamethasone (DECADRON) 4 MG tablet Take 4mg  3 times daily for 7days,Take 4mg  2 times daily for 7days,Take 4mg  daily till seen by Dr Julien Nordmann. 50 tablet 0  . ELIQUIS STARTER PACK (ELIQUIS STARTER PACK) 5 MG TABS Take as directed on package: start with two-5mg  tablets twice daily for 7 days. On day 8, switch to one-5mg  tablet twice daily. 1 each 0  . losartan (COZAAR) 25 MG tablet Take 1 tablet (25 mg total) by mouth daily. 30 tablet 0  . polyethylene glycol (MIRALAX / GLYCOLAX) packet Take 17 g by mouth daily. 14 each 0  . senna-docusate (SENOKOT-S) 8.6-50 MG tablet Take 1 tablet by mouth at bedtime. 10 tablet 0  . diazepam (VALIUM) 5 MG tablet Take 1 tablet PO 45 minutes before MRI, and 45 minutes before mask fabrication, and 45 minutes before brain radiation therapy. 3 tablet 0  . fluconazole (DIFLUCAN) 100 MG tablet Take 2 tablets today, then 1 tablet daily x 20 more days. 22 tablet 0   No current facility-administered medications for this encounter.     REVIEW OF SYSTEMS:  A 10+ POINT REVIEW OF SYSTEMS WAS OBTAINED including neurology, dermatology, psychiatry, cardiac,  respiratory, lymph, extremities, GI, GU, Musculoskeletal, constitutional, breasts, reproductive, HEENT.  All pertinent positives are noted in the HPI.  All others are negative.  PHYSICAL EXAM:  height is 5\' 10"  (1.778 m) and weight is 156 lb 4 oz (70.9 kg). His oral temperature is 98.1 F (36.7 C). His blood pressure is 118/61 and his pulse is 89. His respiration is 18 and oxygen saturation is 90%.   General: Alert and oriented, in no acute distress HEENT: Head is normocephalic. Extraocular movements are not intact in the left eye. Mild proptosis of eyes. No thrush. Neck: Neck is supple, no palpable cervical or supraclavicular lymphadenopathy. Heart: Regular in rate and rhythm with no murmurs, rubs, or gallops. Chest: decreased sounds in right lung base Abdomen: Soft, nontender, nondistended, with no rigidity or guarding. Extremities: No cyanosis or edema. Lymphatics: see Neck Exam Skin: No concerning lesions. Musculoskeletal: symmetric strength and muscle tone throughout. Neurologic: see HEENT - EOMI not intact in left eye. Speech is fluent. Coordination is grossly intact. Psychiatric: Judgment and insight are intact. Affect is appropriate.  KPS = 70  100 - Normal; no complaints; no evidence of disease. 90   - Able to carry on normal activity; minor signs or symptoms of disease. 80   - Normal activity  with effort; some signs or symptoms of disease. 28   - Cares for self; unable to carry on normal activity or to do active work. 60   - Requires occasional assistance, but is able to care for most of his personal needs. 50   - Requires considerable assistance and frequent medical care. 67   - Disabled; requires special care and assistance. 76   - Severely disabled; hospital admission is indicated although death not imminent. 35   - Very sick; hospital admission necessary; active supportive treatment necessary. 10   - Moribund; fatal processes progressing rapidly. 0     - Dead  Karnofsky  DA, Abelmann WH, Craver LS and Garvin [phone]) The use of the nitrogen mustards in the palliative treatment of carcinoma: with particular reference to bronchogenic carcinoma Cancer 1 [id number]   LABORATORY DATA:  Lab Results  Component Value Date   WBC 7.5 03/05/2018   HGB 14.3 03/05/2018   HCT 47.8 03/05/2018   MCV 89.3 03/05/2018   PLT 229 03/05/2018   CMP     Component Value Date/Time   NA 139 03/06/2018 0348   NA 138 05/04/2017 1011   K 4.0 03/06/2018 0348   K 3.9 05/04/2017 1011   CL 100 03/06/2018 0348   CO2 28 03/06/2018 0348   CO2 24 05/04/2017 1011   GLUCOSE 208 (H) 03/06/2018 0348   GLUCOSE 105 05/04/2017 1011   BUN 14 03/06/2018 0348   BUN 18.4 05/04/2017 1011   CREATININE 0.71 03/06/2018 0348   CREATININE 0.78 02/22/2018 0900   CREATININE 1.0 05/04/2017 1011   CALCIUM 9.1 03/06/2018 0348   CALCIUM 9.3 05/04/2017 1011   PROT 6.8 02/22/2018 0900   PROT 7.7 05/04/2017 1011   ALBUMIN 2.9 (L) 02/22/2018 0900   ALBUMIN 3.2 (L) 05/04/2017 1011   AST 20 02/22/2018 0900   AST 23 05/04/2017 1011   ALT 13 02/22/2018 0900   ALT 19 05/04/2017 1011   ALKPHOS 90 02/22/2018 0900   ALKPHOS 91 05/04/2017 1011   BILITOT 0.3 02/22/2018 0900   BILITOT 0.39 05/04/2017 1011   GFRNONAA >60 03/06/2018 0348   GFRNONAA >60 02/22/2018 0900   GFRAA >60 03/06/2018 0348   GFRAA >60 02/22/2018 0900         RADIOGRAPHY ( I have personally reviewed his images): X-ray Chest Pa And Lateral  Result Date: 03/02/2018 CLINICAL DATA:  Pleural effusion, cough, shortness of breath, history RIGHT lung cancer EXAM: CHEST - 2 VIEW COMPARISON:  03/01/2018 FINDINGS: Normal heart size, mediastinal contours, and pulmonary vascularity. Chronic interstitial prominence in the LEFT lung. Persistent RIGHT pleural effusion and basilar atelectasis, patient with known tumor in inferior RIGHT hemithorax. Diffuse infiltrate throughout remaining aerated RIGHT lung; this could represent infection or  lymphangitic tumor spread. No discrete LEFT lung mass or pneumothorax. Bones demineralized. IMPRESSION: Persistent opacification of the inferior half of the RIGHT hemithorax by combination of pleural effusion, atelectasis and known tumor. Persistent chronic infiltrate in residual aerated RIGHT lung with scattered interstitial infiltrates in the LEFT lung, could represent infection though lymphangitic tumor spread could cause a similar pattern. Electronically Signed   By: Lavonia Dana M.D.   On: 03/02/2018 10:44   Ct Head W & Wo Contrast  Addendum Date: 03/06/2018   ADDENDUM REPORT: 03/06/2018 07:51 ADDENDUM: Clinical data as stated in the history was entered incorrectly by the technologist in the study notes. Patient has Adenocarcinoma of the RIGHT lung, stage IV, not small cell carcinoma. Electronically Signed   By:  Staci Righter M.D.   On: 03/06/2018 07:51   Result Date: 03/06/2018 CLINICAL DATA:  Exophthalmos.  Small cell lung cancer. EXAM: CT HEAD AND ORBITS WITHOUT AND WITH CONTRAST TECHNIQUE: Contiguous axial images were obtained from the base of the skull through the vertex with and without contrast. Multidetector CT imaging of the orbits was performed using the standard protocol with and without intravenous contrast. CONTRAST:  72mL OMNIPAQUE IOHEXOL 300 MG/ML  SOLN COMPARISON:  MR head 12/20/2016. FINDINGS: CT HEAD FINDINGS Brain: No acute stroke or hemorrhage. There are multiple intracranial mass lesions consistent with metastases, which demonstrate homogeneous postcontrast enhancement. The largest measures 8 mm, LEFT periventricular location. BILATERAL frontal, parietal, and occipital metastases are observed. None are seen in the cerebellum or brainstem. Vasogenic edema is greatest in the LEFT occipital lobe. Vascular: Visible vessels are patent. Vascular calcification consistent with intracranial atherosclerotic disease. Skull: No calvarial destructive lesions. Other: None. CT ORBITS FINDINGS  Orbits: No traumatic or inflammatory finding. Globes, optic nerves, orbital fat, extraocular muscles, vascular structures, and lacrimal glands are normal. Symmetric proptosis, uncertain significance. Doubt increased intracranial pressure. Visualized sinuses: Clear. Soft tissues: Unremarkable. IMPRESSION: CT head without and with contrast demonstrates BILATERAL subcentimeter supratentorial intra-axial metastases, consistent with the pattern frequently seen in small cell lung cancer. No midline shift, but asymmetric vasogenic edema is most pronounced in the LEFT occipital lobe. Detection of all metastases is more readily accomplished using pre and postcontrast MRI of the brain, a modality which displays increased sensitivity to smaller metastatic deposits. No osseous metastatic disease. 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 Significance uncertain. A call has been placed to the ordering provider. Electronically Signed: By: Staci Righter M.D. On: 03/04/2018 17:20   Ct Angio Chest Pe W Or Wo Contrast  Result Date: 03/02/2018 CLINICAL DATA:  New onset hypoxemia. History of metastatic non-small cell lung cancer. EXAM: CT ANGIOGRAPHY CHEST WITH CONTRAST TECHNIQUE: Multidetector CT imaging of the chest was performed using the standard protocol during bolus administration of intravenous contrast. Multiplanar CT image reconstructions and MIPs were obtained to evaluate the vascular anatomy. CONTRAST:  155mL ISOVUE-370 IOPAMIDOL (ISOVUE-370) INJECTION 76% COMPARISON:  CT chest dated January 31, 2018. FINDINGS: Cardiovascular: New acute lobar and segmental pulmonary emboli involving the left upper lobe and anterior segment. Emboli in the left upper lobe anterior segment are occlusive. Nonocclusive emboli in subsegmental superior lingular branches. No left lower lobe or right-sided pulmonary emboli. Normal RV/LV ratio of 0.84. Normal heart size. No pericardial effusion. Normal caliber thoracic aorta. Coronary,  aortic arch, and branch vessel atherosclerotic vascular disease. Mediastinum/Nodes: Unchanged mild right axillary lymphadenopathy measuring up to 1.1 cm. No left axillary lymphadenopathy. Unchanged enlarged 1.0 cm right cardiophrenic lymph node. Previously seen enlarged aortopulmonary window, subcarinal, and right hilar lymph nodes are not as well visualized due to phase of contrast, but appear grossly unchanged. The thyroid gland, trachea, and esophagus demonstrate no significant findings. Lungs/Pleura: Stable volume loss in the right hemithorax. Unchanged irregular prominent right-sided pleural thickening and hyperenhancement with small loculated dependent and subpulmonic right pleural effusion. Unchanged interlobular septal thickening throughout the right lung. Stable consolidation throughout much of the right middle and right lower lobes. Numerous pulmonary nodules scattered throughout the left lung are stable to slightly increased in size. For example a posterior left upper lobe nodule now measures 9 mm, previously 7 mm. Severe centrilobular emphysema and mild diffuse bronchial wall thickening again noted. No pneumothorax. Upper Abdomen: No acute abnormality. Musculoskeletal: Unchanged small lytic metastases involving the right posterolateral  eighth rib and right posterior levin through. Review of the MIP images confirms the above findings. IMPRESSION: 1. New acute lobar and segmental pulmonary emboli involving the left upper lobe. Emboli in the left upper lobe anterior segment are occlusive. No evidence of right heart strain. 2. Unchanged extensive circumferential tumor in the right pleural space. Unchanged lymphangitic carcinomatosis and consolidative tumor/atelectasis throughout the right lung. Unchanged loculated subpulmonic right pleural effusion. 3. Stable to mildly increased left lung metastases. 4. Stable mediastinal, right hilar, and right axillary lymphadenopathy. 5.  Emphysema (ICD10-J43.9). 6.   Aortic atherosclerosis (ICD10-I70.0). Critical Value/emergent results were called by telephone at the time of interpretation on 03/02/2018 at 1:47 pm to Dr. Berle Mull , who verbally acknowledged these results. Electronically Signed   By: Titus Dubin M.D.   On: 03/02/2018 13:47   Dg Chest Portable 1 View  Result Date: 03/01/2018 CLINICAL DATA:  Worsening shortness of breath for 2 days, history lung cancer, hypertension EXAM: PORTABLE CHEST 1 VIEW COMPARISON:  Portable exam 0707 hours compared to CT chest of 01/31/2018 FINDINGS: Subtotal opacification of the RIGHT hemithorax by combination of pleural effusion and tumor as well as RIGHT lung atelectasis. Mediastinal shift to RIGHT. Normal heart size and pulmonary vascularity. Scattered chronic accentuation of interstitial markings in the LEFT lung. The discrete pulmonary metastases seen in the LEFT lung on the prior CT exam are less well delineated radiographically. No segmental infiltrate or pleural effusion in the LEFT lung. No pneumothorax. Bones demineralized. IMPRESSION: Chronic subtotal opacification of RIGHT hemithorax by known tumor, effusion, and atelectasis. Chronic accentuation of interstitial markings in LEFT lung without superimposed acute infiltrate. Poorly visualized LEFT lung metastases as noted on prior CT. Electronically Signed   By: Lavonia Dana M.D.   On: 03/01/2018 07:41   Ir US Chest  Result Date: 03/01/2018 CLINICAL DATA:  67 year old male with a history of right-sided lung cancer nearly completely replacing the right lung with central necrosis at the lung base. He presents with increased shortness of breath. Evaluate for pleural fluid for possible thoracentesis. EXAM: CHEST ULTRASOUND COMPARISON:  Most recent prior CT scan of the chest 01/31/2018 FINDINGS: No free pleural fluid. There is fluid within the centrally necrotic right lower lobe, however this is surrounded by a thick rind of tumor tissue and would not result in any  further aeration of the lung. Thoracentesis was not performed. IMPRESSION: Negative for free pleural fluid.  Thoracentesis was not performed. There is fluid within the centrally necrotic right lower lobe pulmonary mass, however this is surrounded by a thick rind of tumor and is not amenable to thoracentesis. Electronically Signed   By: Jacqulynn Cadet M.D.   On: 03/01/2018 16:15   Ct Orbits W Contrast  Addendum Date: 03/06/2018   ADDENDUM REPORT: 03/06/2018 07:51 ADDENDUM: Clinical data as stated in the history was entered incorrectly by the technologist in the study notes. Patient has Adenocarcinoma of the RIGHT lung, stage IV, not small cell carcinoma. Electronically Signed   By: Staci Righter M.D.   On: 03/06/2018 07:51   Result Date: 03/06/2018 CLINICAL DATA:  Exophthalmos.  Small cell lung cancer. EXAM: CT HEAD AND ORBITS WITHOUT AND WITH CONTRAST TECHNIQUE: Contiguous axial images were obtained from the base of the skull through the vertex with and without contrast. Multidetector CT imaging of the orbits was performed using the standard protocol with and without intravenous contrast. CONTRAST:  82mL OMNIPAQUE IOHEXOL 300 MG/ML  SOLN COMPARISON:  MR head 12/20/2016. FINDINGS: CT HEAD FINDINGS Brain:  No acute stroke or hemorrhage. There are multiple intracranial mass lesions consistent with metastases, which demonstrate homogeneous postcontrast enhancement. The largest measures 8 mm, LEFT periventricular location. BILATERAL frontal, parietal, and occipital metastases are observed. None are seen in the cerebellum or brainstem. Vasogenic edema is greatest in the LEFT occipital lobe. Vascular: Visible vessels are patent. Vascular calcification consistent with intracranial atherosclerotic disease. Skull: No calvarial destructive lesions. Other: None. CT ORBITS FINDINGS Orbits: No traumatic or inflammatory finding. Globes, optic nerves, orbital fat, extraocular muscles, vascular structures, and lacrimal  glands are normal. Symmetric proptosis, uncertain significance. Doubt increased intracranial pressure. Visualized sinuses: Clear. Soft tissues: Unremarkable. IMPRESSION: CT head without and with contrast demonstrates BILATERAL subcentimeter supratentorial intra-axial metastases, consistent with the pattern frequently seen in small cell lung cancer. No midline shift, but asymmetric vasogenic edema is most pronounced in the LEFT occipital lobe. Detection of all metastases is more readily accomplished using pre and postcontrast MRI of the brain, a modality which displays increased sensitivity to smaller metastatic deposits. No osseous metastatic disease. 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 Significance uncertain. A call has been placed to the ordering provider. Electronically Signed: By: Staci Righter M.D. On: 03/04/2018 17:20      IMPRESSION/PLAN: This is a very pleasant 67 year old patient with metastatic disease to the brain.  I had a lengthy discussion with the patient after reviewing their CT head imaging with them.  We spoke about whole brain radiotherapy versus stereotactic radiosurgery to the brain. We spoke about the differing risks benefits and side effects of both of these treatments. During part of our discussion, we spoke about the hair loss, fatigue and cognitive effects that can result from whole brain radiotherapy.  Additionally, we spoke about radionecrosis that can result from stereotactic radiosurgery. I explained that whole brain radiotherapy is more comprehensive and therefore can decrease the chance of recurrences elsewhere in the brain, while stereotactic radiosurgery only treats the areas of gross disease while sparing the rest of the brain parenchyma. To perform SRS, we would need an MRI for treatment planning,  It is possible this will show disseminated disease in the brain, which would require whole brain RT.  After lengthy discussion, the patient would prefer to proceed  with stereotactic brain radiosurgery to their metastatic disease if possible. He would prefer to delay his MRI for a week and a half due to other stressors.  [facility] will arrange this. They will meet with neurosurgery in the near future to discuss this further; a neurosurgeon will participate in their case is SRS is feasible.  CT simulation will take place after the MRI. Patient is enthusiastic with this plan. __________________________________________   Eppie Gibson, MD

## 2018-03-13 ENCOUNTER — Encounter: Payer: Self-pay | Admitting: Family Medicine

## 2018-03-14 ENCOUNTER — Encounter: Payer: Self-pay | Admitting: Radiation Oncology

## 2018-03-14 DIAGNOSIS — C7931 Secondary malignant neoplasm of brain: Secondary | ICD-10-CM | POA: Insufficient documentation

## 2018-03-15 ENCOUNTER — Inpatient Hospital Stay: Payer: Medicare Other

## 2018-03-15 ENCOUNTER — Telehealth: Payer: Self-pay | Admitting: Internal Medicine

## 2018-03-15 ENCOUNTER — Encounter: Payer: Self-pay | Admitting: Internal Medicine

## 2018-03-15 ENCOUNTER — Other Ambulatory Visit: Payer: Self-pay | Admitting: *Deleted

## 2018-03-15 ENCOUNTER — Inpatient Hospital Stay (HOSPITAL_BASED_OUTPATIENT_CLINIC_OR_DEPARTMENT_OTHER): Payer: Medicare Other | Admitting: Internal Medicine

## 2018-03-15 ENCOUNTER — Other Ambulatory Visit: Payer: Self-pay | Admitting: Radiation Therapy

## 2018-03-15 VITALS — BP 128/90 | HR 80 | Temp 98.0°F | Resp 18 | Ht 70.0 in | Wt 155.9 lb

## 2018-03-15 DIAGNOSIS — Z7901 Long term (current) use of anticoagulants: Secondary | ICD-10-CM | POA: Diagnosis not present

## 2018-03-15 DIAGNOSIS — R5383 Other fatigue: Secondary | ICD-10-CM

## 2018-03-15 DIAGNOSIS — C3411 Malignant neoplasm of upper lobe, right bronchus or lung: Secondary | ICD-10-CM

## 2018-03-15 DIAGNOSIS — Z79899 Other long term (current) drug therapy: Secondary | ICD-10-CM

## 2018-03-15 DIAGNOSIS — C7931 Secondary malignant neoplasm of brain: Secondary | ICD-10-CM | POA: Diagnosis not present

## 2018-03-15 DIAGNOSIS — C3491 Malignant neoplasm of unspecified part of right bronchus or lung: Secondary | ICD-10-CM

## 2018-03-15 DIAGNOSIS — J449 Chronic obstructive pulmonary disease, unspecified: Secondary | ICD-10-CM

## 2018-03-15 DIAGNOSIS — I10 Essential (primary) hypertension: Secondary | ICD-10-CM | POA: Diagnosis not present

## 2018-03-15 DIAGNOSIS — I2699 Other pulmonary embolism without acute cor pulmonale: Secondary | ICD-10-CM

## 2018-03-15 DIAGNOSIS — J910 Malignant pleural effusion: Secondary | ICD-10-CM | POA: Diagnosis not present

## 2018-03-15 DIAGNOSIS — Z5112 Encounter for antineoplastic immunotherapy: Secondary | ICD-10-CM | POA: Diagnosis not present

## 2018-03-15 DIAGNOSIS — Z7189 Other specified counseling: Secondary | ICD-10-CM

## 2018-03-15 LAB — CBC WITH DIFFERENTIAL (CANCER CENTER ONLY)
Abs Immature Granulocytes: 0.11 10*3/uL — ABNORMAL HIGH (ref 0.00–0.07)
Basophils Absolute: 0 10*3/uL (ref 0.0–0.1)
Basophils Relative: 0 %
Eosinophils Absolute: 0 10*3/uL (ref 0.0–0.5)
Eosinophils Relative: 0 %
HEMATOCRIT: 49.3 % (ref 39.0–52.0)
HEMOGLOBIN: 15.5 g/dL (ref 13.0–17.0)
IMMATURE GRANULOCYTES: 1 %
LYMPHS ABS: 1.1 10*3/uL (ref 0.7–4.0)
LYMPHS PCT: 9 %
MCH: 27.8 pg (ref 26.0–34.0)
MCHC: 31.4 g/dL (ref 30.0–36.0)
MCV: 88.5 fL (ref 80.0–100.0)
Monocytes Absolute: 0.9 10*3/uL (ref 0.1–1.0)
Monocytes Relative: 7 %
NEUTROS ABS: 10.2 10*3/uL — AB (ref 1.7–7.7)
NRBC: 0 % (ref 0.0–0.2)
Neutrophils Relative %: 83 %
Platelet Count: 188 10*3/uL (ref 150–400)
RBC: 5.57 MIL/uL (ref 4.22–5.81)
RDW: 16.9 % — ABNORMAL HIGH (ref 11.5–15.5)
WBC: 12.3 10*3/uL — AB (ref 4.0–10.5)

## 2018-03-15 LAB — CMP (CANCER CENTER ONLY)
ALT: 23 U/L (ref 0–44)
AST: 12 U/L — ABNORMAL LOW (ref 15–41)
Albumin: 3 g/dL — ABNORMAL LOW (ref 3.5–5.0)
Alkaline Phosphatase: 81 U/L (ref 38–126)
Anion gap: 12 (ref 5–15)
BUN: 18 mg/dL (ref 8–23)
CHLORIDE: 98 mmol/L (ref 98–111)
CO2: 28 mmol/L (ref 22–32)
Calcium: 8.9 mg/dL (ref 8.9–10.3)
Creatinine: 0.74 mg/dL (ref 0.61–1.24)
Glucose, Bld: 254 mg/dL — ABNORMAL HIGH (ref 70–99)
POTASSIUM: 4.2 mmol/L (ref 3.5–5.1)
SODIUM: 138 mmol/L (ref 135–145)
Total Bilirubin: 0.4 mg/dL (ref 0.3–1.2)
Total Protein: 6.2 g/dL — ABNORMAL LOW (ref 6.5–8.1)

## 2018-03-15 NOTE — Progress Notes (Signed)
error 

## 2018-03-15 NOTE — Progress Notes (Signed)
[place] Telephone:(336) [phone]   Fax:([phone]  OFFICE PROGRESS NOTE  [name], NP-C [place] [address]  DIAGNOSIS: Stage IV (T3, N1, M1a) non-small cell lung cancer, adenocarcinoma diagnosed in July 2018 and presented with a right hilar lymphadenopathy and malignant right pleural effusion diagnosed in July 2018. Molecular studies were negative for EGFR, ALK, ROS 1 and BRAF mutations. PDL 1 expression was 50%  PRIOR THERAPY:  Ketruda (pembrolizumab) 200 mg IV every 3 weeks, status post 17 cycles.  Last dose was giving February 22, 2018 discontinued secondary to disease progression with multiple brain metastases.  CURRENT THERAPY: None  INTERVAL HISTORY: Bradley Maldonado 67 y.o. male returns to the clinic today for follow-up visit accompanied by a family member.  The patient was recently admitted to [facility] with shortness of breath and was found to have pulmonary embolism and started on treatment with Eliquis.  During his evaluation he had CT scan of the head that showed multiple new metastatic brain lesions.  He denied having any current chest pain but continues to have shortness of breath at baseline increased with exertion with cough and no hemoptysis.  The patient denied having any recent nausea, vomiting, diarrhea or constipation.  He denied having any fever or chills.  [name] here today for evaluation and discussion of his treatment options.   MEDICAL HISTORY: Past Medical History:  Diagnosis Date  . Adenocarcinoma of right lung, stage 4 ([place]) 01/24/2017  . Goals of care, counseling/discussion 01/24/2017  . Hypertension     ALLERGIES:  has No Known Allergies.  MEDICATIONS:  Current Outpatient Medications  Medication Sig Dispense Refill  . carvedilol (COREG) 3.125 MG tablet Take 1 tablet (3.125 mg total) by mouth 2 (two) times daily with a meal. 60 tablet 0  . dexamethasone (DECADRON) 4 MG tablet Take '4mg'$  3  times daily for 7days,Take '4mg'$  2 times daily for 7days,Take '4mg'$  daily till seen by Dr Julien Nordmann. 50 tablet 0  . diazepam (VALIUM) 5 MG tablet Take 1 tablet PO 45 minutes before MRI, and 45 minutes before mask fabrication, and 45 minutes before brain radiation therapy. 3 tablet 0  . ELIQUIS STARTER PACK (ELIQUIS STARTER PACK) 5 MG TABS Take as directed on package: start with two-'5mg'$  tablets twice daily for 7 days. On day 8, switch to one-'5mg'$  tablet twice daily. 1 each 0  . fluconazole (DIFLUCAN) 100 MG tablet Take 2 tablets today, then 1 tablet daily x 20 more days. 22 tablet 0  . losartan (COZAAR) 25 MG tablet Take 1 tablet (25 mg total) by mouth daily. 30 tablet 0  . polyethylene glycol (MIRALAX / GLYCOLAX) packet Take 17 g by mouth daily. 14 each 0  . senna-docusate (SENOKOT-S) 8.6-50 MG tablet Take 1 tablet by mouth at bedtime. 10 tablet 0   No current facility-administered medications for this visit.     SURGICAL HISTORY:  Past Surgical History:  Procedure Laterality Date  . CHEST TUBE INSERTION Right 02/06/2017   Procedure: INSERTION PLEURAL DRAINAGE CATHETER;  Surgeon: Ivin Poot, MD;  Location: [facility];  Service: Thoracic;  Laterality: Right;  . EYE SURGERY Right    metal removed from eye  . Hip replacement    . IR THORACENTESIS ASP PLEURAL SPACE W/IMG GUIDE  12/19/2016  . REMOVAL OF PLEURAL DRAINAGE CATHETER Right 03/21/2017   Procedure: REMOVAL OF RIGHT PLEURAL DRAINAGE CATHETER;  Surgeon: Ivin Poot, MD;  Location: [facility];  Service: Thoracic;  Laterality: Right;    REVIEW OF SYSTEMS:  Constitutional: positive for fatigue Eyes: negative Ears, nose, mouth, throat, and face: negative Respiratory: positive for dyspnea on exertion Cardiovascular: negative Gastrointestinal: negative Genitourinary:negative Integument/breast: negative Hematologic/lymphatic: negative Musculoskeletal:negative Neurological: negative Behavioral/Psych: negative Endocrine:  negative Allergic/Immunologic: negative   PHYSICAL EXAMINATION: General appearance: alert, cooperative and no distress Head: Normocephalic, without obvious abnormality, atraumatic Neck: no adenopathy, no JVD, supple, symmetrical, trachea midline and thyroid not enlarged, symmetric, no tenderness/mass/nodules Lymph nodes: Cervical, supraclavicular, and axillary nodes normal. Resp: clear to auscultation bilaterally Back: symmetric, no curvature. ROM normal. No CVA tenderness. Cardio: regular rate and rhythm, S1, S2 normal, no murmur, click, rub or gallop GI: soft, non-tender; bowel sounds normal; no masses,  no organomegaly Extremities: extremities normal, atraumatic, no cyanosis or edema Neurologic: Alert and oriented X 3, normal strength and tone. Normal symmetric reflexes. Normal coordination and gait  ECOG PERFORMANCE STATUS: 1 - Symptomatic but completely ambulatory  Blood pressure 128/90, pulse 80, temperature 98 F (36.7 C), temperature source Oral, resp. rate 18, height '5\' 10"'$  (1.778 m), weight 155 lb 14.4 oz (70.7 kg), SpO2 95 %.  LABORATORY DATA: Lab Results  Component Value Date   WBC 12.3 (H) 03/15/2018   HGB 15.5 03/15/2018   HCT 49.3 03/15/2018   MCV 88.5 03/15/2018   PLT 188 03/15/2018      Chemistry      Component Value Date/Time   NA 139 03/06/2018 0348   NA 138 05/04/2017 1011   K 4.0 03/06/2018 0348   K 3.9 05/04/2017 1011   CL 100 03/06/2018 0348   CO2 28 03/06/2018 0348   CO2 24 05/04/2017 1011   BUN 14 03/06/2018 0348   BUN 18.4 05/04/2017 1011   CREATININE 0.71 03/06/2018 0348   CREATININE 0.78 02/22/2018 0900   CREATININE 1.0 05/04/2017 1011      Component Value Date/Time   CALCIUM 9.1 03/06/2018 0348   CALCIUM 9.3 05/04/2017 1011   ALKPHOS 90 02/22/2018 0900   ALKPHOS 91 05/04/2017 1011   AST 20 02/22/2018 0900   AST 23 05/04/2017 1011   ALT 13 02/22/2018 0900   ALT 19 05/04/2017 1011   BILITOT 0.3 02/22/2018 0900   BILITOT 0.39 05/04/2017  1011       RADIOGRAPHIC STUDIES: X-ray Chest Pa And Lateral  Result Date: 03/02/2018 CLINICAL DATA:  Pleural effusion, cough, shortness of breath, history RIGHT lung cancer EXAM: CHEST - 2 VIEW COMPARISON:  03/01/2018 FINDINGS: Normal heart size, mediastinal contours, and pulmonary vascularity. Chronic interstitial prominence in the LEFT lung. Persistent RIGHT pleural effusion and basilar atelectasis, patient with known tumor in inferior RIGHT hemithorax. Diffuse infiltrate throughout remaining aerated RIGHT lung; this could represent infection or lymphangitic tumor spread. No discrete LEFT lung mass or pneumothorax. Bones demineralized. IMPRESSION: Persistent opacification of the inferior half of the RIGHT hemithorax by combination of pleural effusion, atelectasis and known tumor. Persistent chronic infiltrate in residual aerated RIGHT lung with scattered interstitial infiltrates in the LEFT lung, could represent infection though lymphangitic tumor spread could cause a similar pattern. Electronically Signed   By: Lavonia Dana M.D.   On: 03/02/2018 10:44   Ct Head W & Wo Contrast  Addendum Date: 03/06/2018   ADDENDUM REPORT: 03/06/2018 07:51 ADDENDUM: Clinical data as stated in the history was entered incorrectly by the technologist in the study notes. Patient has Adenocarcinoma of the RIGHT lung, stage IV, not small cell carcinoma. Electronically Signed   By: Roderic Ovens.D.  On: 03/06/2018 07:51   Result Date: 03/06/2018 CLINICAL DATA:  Exophthalmos.  Small cell lung cancer. EXAM: CT HEAD AND ORBITS WITHOUT AND WITH CONTRAST TECHNIQUE: Contiguous axial images were obtained from the base of the skull through the vertex with and without contrast. Multidetector CT imaging of the orbits was performed using the standard protocol with and without intravenous contrast. CONTRAST:  37m OMNIPAQUE IOHEXOL 300 MG/ML  SOLN COMPARISON:  MR head 12/20/2016. FINDINGS: CT HEAD FINDINGS Brain: No acute stroke  or hemorrhage. There are multiple intracranial mass lesions consistent with metastases, which demonstrate homogeneous postcontrast enhancement. The largest measures 8 mm, LEFT periventricular location. BILATERAL frontal, parietal, and occipital metastases are observed. None are seen in the cerebellum or brainstem. Vasogenic edema is greatest in the LEFT occipital lobe. Vascular: Visible vessels are patent. Vascular calcification consistent with intracranial atherosclerotic disease. Skull: No calvarial destructive lesions. Other: None. CT ORBITS FINDINGS Orbits: No traumatic or inflammatory finding. Globes, optic nerves, orbital fat, extraocular muscles, vascular structures, and lacrimal glands are normal. Symmetric proptosis, uncertain significance. Doubt increased intracranial pressure. Visualized sinuses: Clear. Soft tissues: Unremarkable. IMPRESSION: CT head without and with contrast demonstrates BILATERAL subcentimeter supratentorial intra-axial metastases, consistent with the pattern frequently seen in small cell lung cancer. No midline shift, but asymmetric vasogenic edema is most pronounced in the LEFT occipital lobe. Detection of all metastases is more readily accomplished using pre and postcontrast MRI of the brain, a modality which displays increased sensitivity to smaller metastatic deposits. No osseous metastatic disease. 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 Significance uncertain. A call has been placed to the ordering provider. Electronically Signed: By: JStaci RighterM.D. On: 03/04/2018 17:20   Ct Angio Chest Pe W Or Wo Contrast  Result Date: 03/02/2018 CLINICAL DATA:  New onset hypoxemia. History of metastatic non-small cell lung cancer. EXAM: CT ANGIOGRAPHY CHEST WITH CONTRAST TECHNIQUE: Multidetector CT imaging of the chest was performed using the standard protocol during bolus administration of intravenous contrast. Multiplanar CT image reconstructions and MIPs were obtained to  evaluate the vascular anatomy. CONTRAST:  1073mISOVUE-370 IOPAMIDOL (ISOVUE-370) INJECTION 76% COMPARISON:  CT chest dated January 31, 2018. FINDINGS: Cardiovascular: New acute lobar and segmental pulmonary emboli involving the left upper lobe and anterior segment. Emboli in the left upper lobe anterior segment are occlusive. Nonocclusive emboli in subsegmental superior lingular branches. No left lower lobe or right-sided pulmonary emboli. Normal RV/LV ratio of 0.84. Normal heart size. No pericardial effusion. Normal caliber thoracic aorta. Coronary, aortic arch, and branch vessel atherosclerotic vascular disease. Mediastinum/Nodes: Unchanged mild right axillary lymphadenopathy measuring up to 1.1 cm. No left axillary lymphadenopathy. Unchanged enlarged 1.0 cm right cardiophrenic lymph node. Previously seen enlarged aortopulmonary window, subcarinal, and right hilar lymph nodes are not as well visualized due to phase of contrast, but appear grossly unchanged. The thyroid gland, trachea, and esophagus demonstrate no significant findings. Lungs/Pleura: Stable volume loss in the right hemithorax. Unchanged irregular prominent right-sided pleural thickening and hyperenhancement with small loculated dependent and subpulmonic right pleural effusion. Unchanged interlobular septal thickening throughout the right lung. Stable consolidation throughout much of the right middle and right lower lobes. Numerous pulmonary nodules scattered throughout the left lung are stable to slightly increased in size. For example a posterior left upper lobe nodule now measures 9 mm, previously 7 mm. Severe centrilobular emphysema and mild diffuse bronchial wall thickening again noted. No pneumothorax. Upper Abdomen: No acute abnormality. Musculoskeletal: Unchanged small lytic metastases involving the right posterolateral eighth rib and right posterior levin  through. Review of the MIP images confirms the above findings. IMPRESSION: 1. New  acute lobar and segmental pulmonary emboli involving the left upper lobe. Emboli in the left upper lobe anterior segment are occlusive. No evidence of right heart strain. 2. Unchanged extensive circumferential tumor in the right pleural space. Unchanged lymphangitic carcinomatosis and consolidative tumor/atelectasis throughout the right lung. Unchanged loculated subpulmonic right pleural effusion. 3. Stable to mildly increased left lung metastases. 4. Stable mediastinal, right hilar, and right axillary lymphadenopathy. 5.  Emphysema (ICD10-J43.9). 6.  Aortic atherosclerosis (ICD10-I70.0). Critical Value/emergent results were called by telephone at the time of interpretation on 03/02/2018 at 1:47 pm to Dr. Berle Mull , who verbally acknowledged these results. Electronically Signed   By: Titus Dubin M.D.   On: 03/02/2018 13:47   Dg Chest Portable 1 View  Result Date: 03/01/2018 CLINICAL DATA:  Worsening shortness of breath for 2 days, history lung cancer, hypertension EXAM: PORTABLE CHEST 1 VIEW COMPARISON:  Portable exam 0707 hours compared to CT chest of 01/31/2018 FINDINGS: Subtotal opacification of the RIGHT hemithorax by combination of pleural effusion and tumor as well as RIGHT lung atelectasis. Mediastinal shift to RIGHT. Normal heart size and pulmonary vascularity. Scattered chronic accentuation of interstitial markings in the LEFT lung. The discrete pulmonary metastases seen in the LEFT lung on the prior CT exam are less well delineated radiographically. No segmental infiltrate or pleural effusion in the LEFT lung. No pneumothorax. Bones demineralized. IMPRESSION: Chronic subtotal opacification of RIGHT hemithorax by known tumor, effusion, and atelectasis. Chronic accentuation of interstitial markings in LEFT lung without superimposed acute infiltrate. Poorly visualized LEFT lung metastases as noted on prior CT. Electronically Signed   By: Lavonia Dana M.D.   On: 03/01/2018 07:41   Ir US  Chest  Result Date: 03/01/2018 CLINICAL DATA:  67 year old male with a history of right-sided lung cancer nearly completely replacing the right lung with central necrosis at the lung base. He presents with increased shortness of breath. Evaluate for pleural fluid for possible thoracentesis. EXAM: CHEST ULTRASOUND COMPARISON:  Most recent prior CT scan of the chest 01/31/2018 FINDINGS: No free pleural fluid. There is fluid within the centrally necrotic right lower lobe, however this is surrounded by a thick rind of tumor tissue and would not result in any further aeration of the lung. Thoracentesis was not performed. IMPRESSION: Negative for free pleural fluid.  Thoracentesis was not performed. There is fluid within the centrally necrotic right lower lobe pulmonary mass, however this is surrounded by a thick rind of tumor and is not amenable to thoracentesis. Electronically Signed   By: Jacqulynn Cadet M.D.   On: 03/01/2018 16:15   Ct Orbits W Contrast  Addendum Date: 03/06/2018   ADDENDUM REPORT: 03/06/2018 07:51 ADDENDUM: Clinical data as stated in the history was entered incorrectly by the technologist in the study notes. Patient has Adenocarcinoma of the RIGHT lung, stage IV, not small cell carcinoma. Electronically Signed   By: Staci Righter M.D.   On: 03/06/2018 07:51   Result Date: 03/06/2018 CLINICAL DATA:  Exophthalmos.  Small cell lung cancer. EXAM: CT HEAD AND ORBITS WITHOUT AND WITH CONTRAST TECHNIQUE: Contiguous axial images were obtained from the base of the skull through the vertex with and without contrast. Multidetector CT imaging of the orbits was performed using the standard protocol with and without intravenous contrast. CONTRAST:  10m OMNIPAQUE IOHEXOL 300 MG/ML  SOLN COMPARISON:  MR head 12/20/2016. FINDINGS: CT HEAD FINDINGS Brain: No acute stroke or hemorrhage. There  are multiple intracranial mass lesions consistent with metastases, which demonstrate homogeneous postcontrast  enhancement. The largest measures 8 mm, LEFT periventricular location. BILATERAL frontal, parietal, and occipital metastases are observed. None are seen in the cerebellum or brainstem. Vasogenic edema is greatest in the LEFT occipital lobe. Vascular: Visible vessels are patent. Vascular calcification consistent with intracranial atherosclerotic disease. Skull: No calvarial destructive lesions. Other: None. CT ORBITS FINDINGS Orbits: No traumatic or inflammatory finding. Globes, optic nerves, orbital fat, extraocular muscles, vascular structures, and lacrimal glands are normal. Symmetric proptosis, uncertain significance. Doubt increased intracranial pressure. Visualized sinuses: Clear. Soft tissues: Unremarkable. IMPRESSION: CT head without and with contrast demonstrates BILATERAL subcentimeter supratentorial intra-axial metastases, consistent with the pattern frequently seen in small cell lung cancer. No midline shift, but asymmetric vasogenic edema is most pronounced in the LEFT occipital lobe. Detection of all metastases is more readily accomplished using pre and postcontrast MRI of the brain, a modality which displays increased sensitivity to smaller metastatic deposits. No osseous metastatic disease. 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 Significance uncertain. A call has been placed to the ordering provider. Electronically Signed: By: Staci Righter M.D. On: 03/04/2018 17:20    ASSESSMENT AND PLAN: This is a very pleasant 67 years old African-American male with a stage IV non-small cell lung cancer, adenocarcinoma with PDL 1 expression of 50% and negative for actionable mutations. The patient is currently undergoing treatment with immunotherapy with Keytruda 200 mg IV every 3 weeks status post 17 cycles.  The patient has been tolerating this treatment well with no concerning adverse effects.  Unfortunately he was found recently to have disease progression with multiple brain metastasis. He was  seen by Dr. Isidore Moos and expected to start palliative radiotherapy to the brain soon. He was also recently diagnosed with pulmonary embolism and currently on treatment with Eliquis.  I recommended for the patient to continue his current treatment. I had a lengthy discussion with the patient and his family member about his current disease status and treatment options. I recommended for the patient to discontinue his current treatment with [facility] because of the disease progression in the brain. I will bring the patient back for follow-up visit in 2-3 weeks after completion of his radiotherapy to the brain for discussion of other treatment options including palliative care and hospice referral versus consideration of palliative systemic chemotherapy with carboplatin and Alimta. The patient was advised to call immediately if he has any concerning symptoms in the interval. The patient voices understanding of current disease status and treatment options and is in agreement with the current care plan. All questions were answered. The patient knows to call the clinic with any problems, questions or concerns. We can certainly see the patient much sooner if necessary.  Disclaimer: This note was dictated with voice recognition software. Similar sounding words can inadvertently be transcribed and may not be corrected upon review.

## 2018-03-15 NOTE — Telephone Encounter (Signed)
Cancelled appt per 10/24 los - scheduled appt per 10/24 - lab and f/;u in 2-3 weeks. Left message for patient with appt date and time

## 2018-03-19 ENCOUNTER — Ambulatory Visit
Admission: RE | Admit: 2018-03-19 | Discharge: 2018-03-19 | Disposition: A | Discharge status: Home / Self Care | Payer: Medicare Other | Source: Ambulatory Visit | Attending: Radiation Oncology | Admitting: Radiation Oncology

## 2018-03-19 DIAGNOSIS — C7931 Secondary malignant neoplasm of brain: Secondary | ICD-10-CM

## 2018-03-19 DIAGNOSIS — C7949 Secondary malignant neoplasm of other parts of nervous system: Principal | ICD-10-CM

## 2018-03-19 MED ORDER — GADOBENATE DIMEGLUMINE 529 MG/ML IV SOLN
14.0000 mL | Freq: Once | INTRAVENOUS | Status: AC | PRN
  Administered 2018-03-19: 14 mL via INTRAVENOUS

## 2018-03-20 ENCOUNTER — Encounter: Payer: Self-pay | Admitting: Internal Medicine

## 2018-03-20 IMAGING — CR DG CHEST 2V
2 series · 2 of 2 positions shown · non-contrast
Comparison: 03/15/2017

CLINICAL DATA: Lung cancer, shortness of breath, cough

EXAM:
CHEST  2 VIEW

[w chest pa]
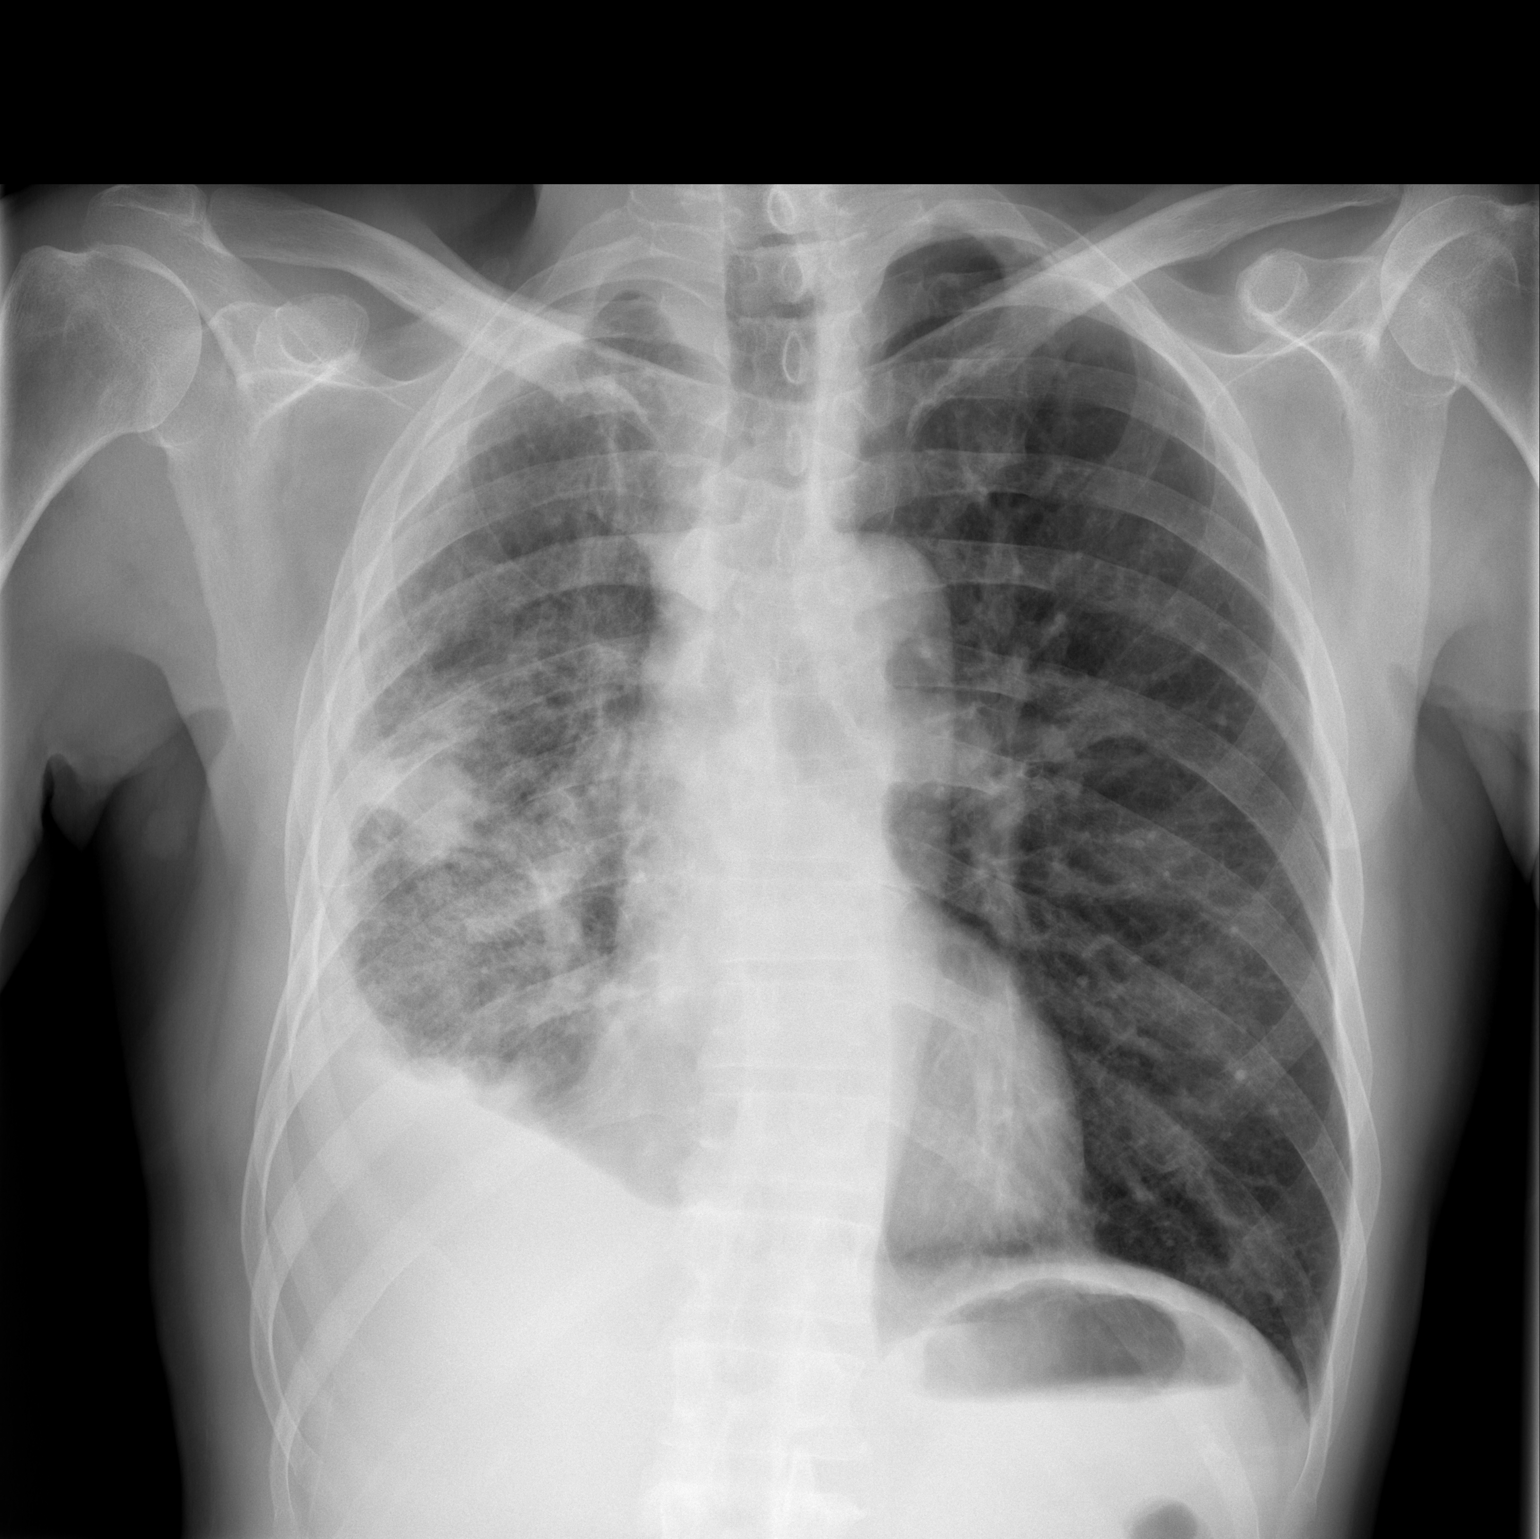

[w chest lat]
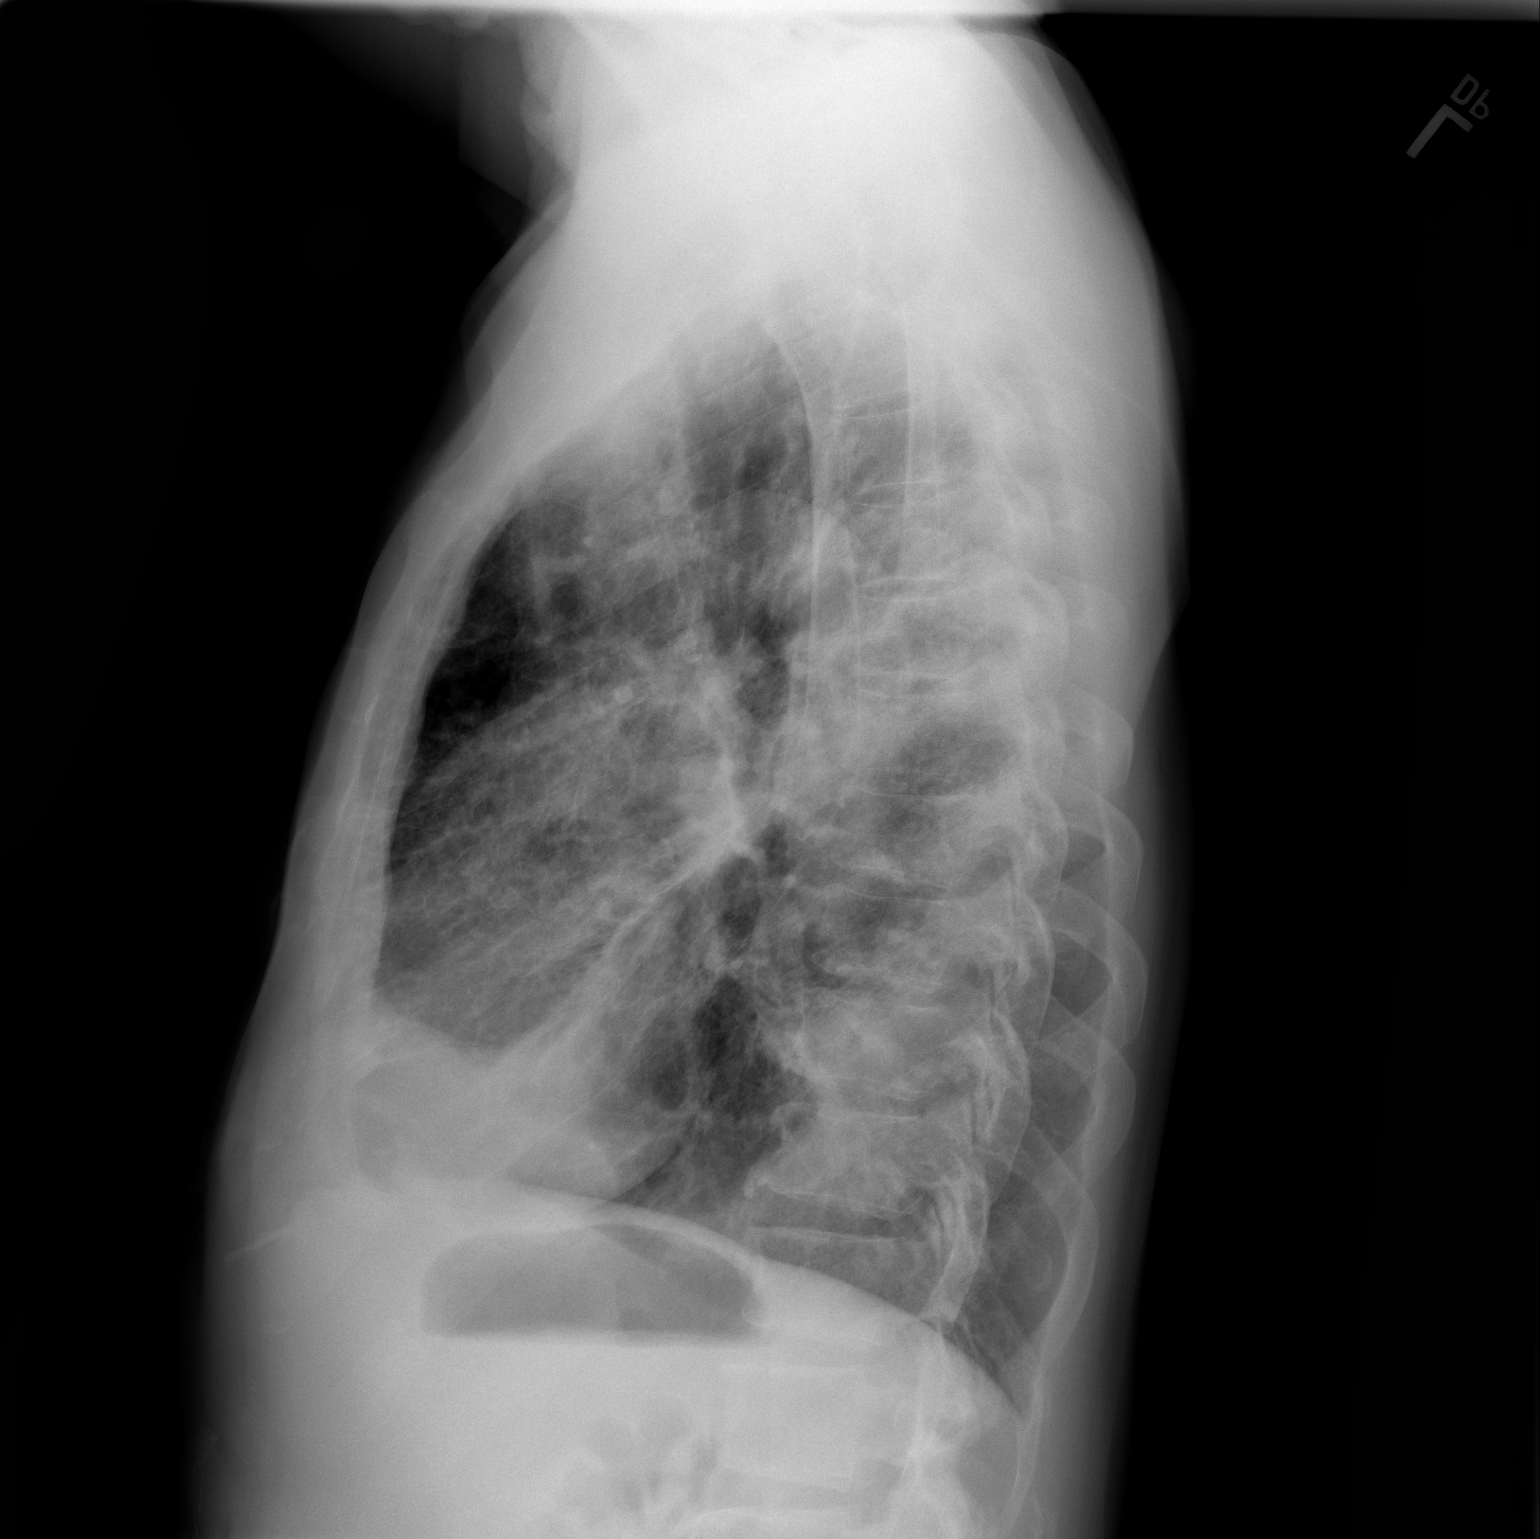

[2 of 2 positions shown; findings below may reference images not displayed]

FINDINGS: Interval removal of previously seen PleurX catheter. Small right
pleural effusion versus pleural thickening. No pneumothorax. Diffuse
lung disease scratched at diffuse opacities throughout the right
lung with masslike area in the right mid lung, unchanged. No
confluent opacity on the left. Heart is normal size.
IMPRESSION: Interval removal of right PleurX catheter. Stable diffuse airspace
disease throughout the right lung with right mid lung mass.

Stable small right pleural effusion versus pleural thickening.

## 2018-03-21 ENCOUNTER — Ambulatory Visit
Admission: RE | Admit: 2018-03-21 | Discharge: 2018-03-21 | Disposition: A | Discharge status: Home / Self Care | Payer: Medicare Other | Source: Ambulatory Visit | Attending: Radiation Oncology | Admitting: Radiation Oncology

## 2018-03-21 ENCOUNTER — Other Ambulatory Visit: Payer: Self-pay | Admitting: Radiation Oncology

## 2018-03-21 ENCOUNTER — Inpatient Hospital Stay: Payer: Medicare Other

## 2018-03-21 DIAGNOSIS — C7931 Secondary malignant neoplasm of brain: Secondary | ICD-10-CM

## 2018-03-21 DIAGNOSIS — C3491 Malignant neoplasm of unspecified part of right bronchus or lung: Secondary | ICD-10-CM | POA: Diagnosis not present

## 2018-03-21 MED ORDER — LORAZEPAM 1 MG PO TABS: ORAL_TABLET | ORAL | 0 refills | Status: DC

## 2018-03-21 NOTE — Progress Notes (Signed)
  SIMULATION AND TREATMENT PLANNING NOTE  Outpatient  DIAGNOSIS:     ICD-10-CM   1. Brain metastases ([place]) C79.31     NARRATIVE:  The patient was brought to the [facility].  Identity was confirmed.  All relevant records and images related to the planned course of therapy were reviewed.  The patient freely provided informed written consent to proceed with treatment after reviewing the details related to the planned course of therapy. The consent form was witnessed and verified by the simulation staff.    Then, the patient was set-up in a stable reproducible  supine position for radiation therapy.  An Aquaplast facemask was custom fitted to the patient's anatomy.  CT images were obtained.  Surface markings were placed.  The CT images were loaded into the planning software.      TREATMENT PLANNING NOTE: Treatment planning then occurred.  The radiation prescription was entered and confirmed.    A total of 3 medically necessary complex treatment devices were fabricated and supervised by me, in the form of an Aquaplast mask and 2 fields with MLCs to block globes, pharynx, spinal cord, lenses. Wedges will be used as needed for dose homogeneity.  MORE FIELDS WITH MLCs MAY BE ADDED IN DOSIMETRY for dose homogeneity.  I have requested : Isodose Plan.     The patient will receive 30 Gy in 10 fractions to the whole brain.  -----------------------------------  Eppie Gibson, MD

## 2018-03-23 ENCOUNTER — Encounter: Payer: Self-pay | Admitting: Radiation Oncology

## 2018-03-23 DIAGNOSIS — C7931 Secondary malignant neoplasm of brain: Secondary | ICD-10-CM | POA: Diagnosis not present

## 2018-03-23 DIAGNOSIS — C3491 Malignant neoplasm of unspecified part of right bronchus or lung: Secondary | ICD-10-CM | POA: Insufficient documentation

## 2018-03-23 NOTE — Addendum Note (Signed)
Encounter addended by: Eppie Gibson, MD on: 03/23/2018 5:45 PM  Actions taken: Sign clinical note

## 2018-03-23 NOTE — Progress Notes (Addendum)
Radiation Oncology         [phone] ________________________________  Name: Bradley Maldonado MRN: [id number]  Date: 03/21/2018  DOB: 1950/08/27  Follow-Up Visit Note  Outpatient  CC: [name], NP-C  Lavina Hamman, MD  Diagnosis and Prior Radiotherapy:    ICD-10-CM   1. Brain metastases ([place]) C79.31     CHIEF COMPLAINT: Here for brain cancer  Narrative:  The patient returns today for routine follow-up.  We reviewed the images and report from his recent MRI of the brain, showing at least 15 metastases.  There may be more, but motion artifact reduced the sensitivity of the scan.  He has discontinued Keytruda and will f/u with Dr Julien Nordmann after radiotherapy to consider salvage systemic therapy vs hospice.  No new complaints today.                          ALLERGIES:  has No Known Allergies.  Meds: Current Outpatient Medications  Medication Sig Dispense Refill  . carvedilol (COREG) 3.125 MG tablet Take 1 tablet (3.125 mg total) by mouth 2 (two) times daily with a meal. 60 tablet 0  . dexamethasone (DECADRON) 4 MG tablet Take 4mg  3 times daily for 7days,Take 4mg  2 times daily for 7days,Take 4mg  daily till seen by Dr Julien Nordmann. 50 tablet 0  . diazepam (VALIUM) 5 MG tablet Take 1 tablet PO 45 minutes before MRI, and 45 minutes before mask fabrication, and 45 minutes before brain radiation therapy. 3 tablet 0  . ELIQUIS STARTER PACK (ELIQUIS STARTER PACK) 5 MG TABS Take as directed on package: start with two-5mg  tablets twice daily for 7 days. On day 8, switch to one-5mg  tablet twice daily. 1 each 0  . fluconazole (DIFLUCAN) 100 MG tablet Take 2 tablets today, then 1 tablet daily x 20 more days. 22 tablet 0  . LORazepam (ATIVAN) 1 MG tablet Take 1 tablet by mouth 20-30 min before radiation therapy. 10 tablet 0  . losartan (COZAAR) 25 MG tablet Take 1 tablet (25 mg total) by mouth daily. 30 tablet 0  . polyethylene glycol (MIRALAX / GLYCOLAX) packet Take 17 g by mouth daily. 14  each 0  . senna-docusate (SENOKOT-S) 8.6-50 MG tablet Take 1 tablet by mouth at bedtime. 10 tablet 0   No current facility-administered medications for this encounter.     Physical Findings: The patient is in no acute distress. Patient is alert and oriented. Vitals with BMI 03/21/2018  Height   Weight   BMI   Systolic 951  Diastolic 77  Pulse 74  Respirations    In wheelchair, NAD, persistent movement deficit in left eye No thrush.  KPS 70  Lab Findings: Lab Results  Component Value Date   WBC 12.3 (H) 03/15/2018   HGB 15.5 03/15/2018   HCT 49.3 03/15/2018   MCV 88.5 03/15/2018   PLT 188 03/15/2018    Radiographic Findings: X-ray Chest Pa And Lateral  Result Date: 03/02/2018 CLINICAL DATA:  Pleural effusion, cough, shortness of breath, history RIGHT lung cancer EXAM: CHEST - 2 VIEW COMPARISON:  03/01/2018 FINDINGS: Normal heart size, mediastinal contours, and pulmonary vascularity. Chronic interstitial prominence in the LEFT lung. Persistent RIGHT pleural effusion and basilar atelectasis, patient with known tumor in inferior RIGHT hemithorax. Diffuse infiltrate throughout remaining aerated RIGHT lung; this could represent infection or lymphangitic tumor spread. No discrete LEFT lung mass or pneumothorax. Bones demineralized. IMPRESSION: Persistent opacification of the inferior half of the RIGHT  hemithorax by combination of pleural effusion, atelectasis and known tumor. Persistent chronic infiltrate in residual aerated RIGHT lung with scattered interstitial infiltrates in the LEFT lung, could represent infection though lymphangitic tumor spread could cause a similar pattern. Electronically Signed   By: Lavonia Dana M.D.   On: 03/02/2018 10:44   Ct Head W & Wo Contrast  Addendum Date: 03/06/2018   ADDENDUM REPORT: 03/06/2018 07:51 ADDENDUM: Clinical data as stated in the history was entered incorrectly by the technologist in the study notes. Patient has Adenocarcinoma of the  RIGHT lung, stage IV, not small cell carcinoma. Electronically Signed   By: Staci Righter M.D.   On: 03/06/2018 07:51   Result Date: 03/06/2018 CLINICAL DATA:  Exophthalmos.  Small cell lung cancer. EXAM: CT HEAD AND ORBITS WITHOUT AND WITH CONTRAST TECHNIQUE: Contiguous axial images were obtained from the base of the skull through the vertex with and without contrast. Multidetector CT imaging of the orbits was performed using the standard protocol with and without intravenous contrast. CONTRAST:  83mL OMNIPAQUE IOHEXOL 300 MG/ML  SOLN COMPARISON:  MR head 12/20/2016. FINDINGS: CT HEAD FINDINGS Brain: No acute stroke or hemorrhage. There are multiple intracranial mass lesions consistent with metastases, which demonstrate homogeneous postcontrast enhancement. The largest measures 8 mm, LEFT periventricular location. BILATERAL frontal, parietal, and occipital metastases are observed. None are seen in the cerebellum or brainstem. Vasogenic edema is greatest in the LEFT occipital lobe. Vascular: Visible vessels are patent. Vascular calcification consistent with intracranial atherosclerotic disease. Skull: No calvarial destructive lesions. Other: None. CT ORBITS FINDINGS Orbits: No traumatic or inflammatory finding. Globes, optic nerves, orbital fat, extraocular muscles, vascular structures, and lacrimal glands are normal. Symmetric proptosis, uncertain significance. Doubt increased intracranial pressure. Visualized sinuses: Clear. Soft tissues: Unremarkable. IMPRESSION: CT head without and with contrast demonstrates BILATERAL subcentimeter supratentorial intra-axial metastases, consistent with the pattern frequently seen in small cell lung cancer. No midline shift, but asymmetric vasogenic edema is most pronounced in the LEFT occipital lobe. Detection of all metastases is more readily accomplished using pre and postcontrast MRI of the brain, a modality which displays increased sensitivity to smaller metastatic  deposits. No osseous metastatic disease. 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 Significance uncertain. A call has been placed to the ordering provider. Electronically Signed: By: Staci Righter M.D. On: 03/04/2018 17:20   Ct Angio Chest Pe W Or Wo Contrast  Result Date: 03/02/2018 CLINICAL DATA:  New onset hypoxemia. History of metastatic non-small cell lung cancer. EXAM: CT ANGIOGRAPHY CHEST WITH CONTRAST TECHNIQUE: Multidetector CT imaging of the chest was performed using the standard protocol during bolus administration of intravenous contrast. Multiplanar CT image reconstructions and MIPs were obtained to evaluate the vascular anatomy. CONTRAST:  120mL ISOVUE-370 IOPAMIDOL (ISOVUE-370) INJECTION 76% COMPARISON:  CT chest dated January 31, 2018. FINDINGS: Cardiovascular: New acute lobar and segmental pulmonary emboli involving the left upper lobe and anterior segment. Emboli in the left upper lobe anterior segment are occlusive. Nonocclusive emboli in subsegmental superior lingular branches. No left lower lobe or right-sided pulmonary emboli. Normal RV/LV ratio of 0.84. Normal heart size. No pericardial effusion. Normal caliber thoracic aorta. Coronary, aortic arch, and branch vessel atherosclerotic vascular disease. Mediastinum/Nodes: Unchanged mild right axillary lymphadenopathy measuring up to 1.1 cm. No left axillary lymphadenopathy. Unchanged enlarged 1.0 cm right cardiophrenic lymph node. Previously seen enlarged aortopulmonary window, subcarinal, and right hilar lymph nodes are not as well visualized due to phase of contrast, but appear grossly unchanged. The thyroid gland, trachea, and esophagus  demonstrate no significant findings. Lungs/Pleura: Stable volume loss in the right hemithorax. Unchanged irregular prominent right-sided pleural thickening and hyperenhancement with small loculated dependent and subpulmonic right pleural effusion. Unchanged interlobular septal thickening  throughout the right lung. Stable consolidation throughout much of the right middle and right lower lobes. Numerous pulmonary nodules scattered throughout the left lung are stable to slightly increased in size. For example a posterior left upper lobe nodule now measures 9 mm, previously 7 mm. Severe centrilobular emphysema and mild diffuse bronchial wall thickening again noted. No pneumothorax. Upper Abdomen: No acute abnormality. Musculoskeletal: Unchanged small lytic metastases involving the right posterolateral eighth rib and right posterior levin through. Review of the MIP images confirms the above findings. IMPRESSION: 1. New acute lobar and segmental pulmonary emboli involving the left upper lobe. Emboli in the left upper lobe anterior segment are occlusive. No evidence of right heart strain. 2. Unchanged extensive circumferential tumor in the right pleural space. Unchanged lymphangitic carcinomatosis and consolidative tumor/atelectasis throughout the right lung. Unchanged loculated subpulmonic right pleural effusion. 3. Stable to mildly increased left lung metastases. 4. Stable mediastinal, right hilar, and right axillary lymphadenopathy. 5.  Emphysema (ICD10-J43.9). 6.  Aortic atherosclerosis (ICD10-I70.0). Critical Value/emergent results were called by telephone at the time of interpretation on 03/02/2018 at 1:47 pm to Dr. Berle Mull , who verbally acknowledged these results. Electronically Signed   By: Titus Dubin M.D.   On: 03/02/2018 13:47   Mr Jeri Cos EX Contrast  Result Date: 03/20/2018 CLINICAL DATA:  Metastatic lung cancer. Stereotactic radio surgery planning EXAM: MRI HEAD WITHOUT AND WITH CONTRAST TECHNIQUE: Multiplanar, multiecho pulse sequences of the brain and surrounding structures were obtained without and with intravenous contrast. CONTRAST:  9mL MULTIHANCE GADOBENATE DIMEGLUMINE 529 MG/ML IV SOLN COMPARISON:  CT head 03/04/2018, MRI 12/20/2016 FINDINGS: Brain: Multiple metastatic  deposits throughout the brain. Image quality degraded by motion. It is quite possible there additional small metastatic deposits present which are not definitively identified on the study due to motion. Several lesions show mild hemorrhage including left occipital lesion, right posterior parietal lesion, and right frontal lesion. No significant edema or midline shift. Individual lesions are annotated on axial postcontrast images. 3 mm left posterior temporal lesion 4 mm right posterior temporal lesion 7 mm left occipital lesion with mild hemorrhage 12 mm left occipital lesion 4.5 mm left parietal lesion 12 mm left parietal white matter lesion 10 mm right posterior parietal lesion 3 mm right parietal white matter lesion 7 mm right frontal lesion 6 mm right frontal cortical lesion 3 mm right posterior frontal white matter lesion 9 mm right frontal convexity lesion 8.3 mm right frontal parietal convexity lesion 10 x 7 mm right posterior frontal cortical lesion 3 mm left parietal convexity lesion Ventricle size normal. No midline shift. Negative for acute infarct. Vascular: Normal arterial flow voids Skull and upper cervical spine: Negative for bony disease. Sinuses/Orbits: Negative Other: None IMPRESSION: At least 15 metastatic deposits are present in the brain. Several show mild hemorrhage. Minimal edema. Image quality degraded by motion especially on the postcontrast images which could obscure additional small lesions. Electronically Signed   By: Franchot Gallo M.D.   On: 03/20/2018 08:03   Dg Chest Portable 1 View  Result Date: 03/01/2018 CLINICAL DATA:  Worsening shortness of breath for 2 days, history lung cancer, hypertension EXAM: PORTABLE CHEST 1 VIEW COMPARISON:  Portable exam 0707 hours compared to CT chest of 01/31/2018 FINDINGS: Subtotal opacification of the RIGHT hemithorax by combination of pleural  effusion and tumor as well as RIGHT lung atelectasis. Mediastinal shift to RIGHT. Normal heart size and  pulmonary vascularity. Scattered chronic accentuation of interstitial markings in the LEFT lung. The discrete pulmonary metastases seen in the LEFT lung on the prior CT exam are less well delineated radiographically. No segmental infiltrate or pleural effusion in the LEFT lung. No pneumothorax. Bones demineralized. IMPRESSION: Chronic subtotal opacification of RIGHT hemithorax by known tumor, effusion, and atelectasis. Chronic accentuation of interstitial markings in LEFT lung without superimposed acute infiltrate. Poorly visualized LEFT lung metastases as noted on prior CT. Electronically Signed   By: Lavonia Dana M.D.   On: 03/01/2018 07:41   Ir US Chest  Result Date: 03/01/2018 CLINICAL DATA:  67 year old male with a history of right-sided lung cancer nearly completely replacing the right lung with central necrosis at the lung base. He presents with increased shortness of breath. Evaluate for pleural fluid for possible thoracentesis. EXAM: CHEST ULTRASOUND COMPARISON:  Most recent prior CT scan of the chest 01/31/2018 FINDINGS: No free pleural fluid. There is fluid within the centrally necrotic right lower lobe, however this is surrounded by a thick rind of tumor tissue and would not result in any further aeration of the lung. Thoracentesis was not performed. IMPRESSION: Negative for free pleural fluid.  Thoracentesis was not performed. There is fluid within the centrally necrotic right lower lobe pulmonary mass, however this is surrounded by a thick rind of tumor and is not amenable to thoracentesis. Electronically Signed   By: Jacqulynn Cadet M.D.   On: 03/01/2018 16:15   Vas [place] Lower Extremity Venous (dvt)  Result Date: 03/04/2018  Lower Venous Study Indications: SOB.  Comparison Study: No prior study on file for comparison Performing Technologist: Sharion Dove RVS  Examination Guidelines: A complete evaluation includes B-mode imaging, spectral Doppler, color Doppler, and power Doppler as needed  of all accessible portions of each vessel. Bilateral testing is considered an integral part of a complete examination. Limited examinations for reoccurring indications may be performed as noted.  Right Venous Findings: +---------+---------------+---------+-----------+----------+-------+          CompressibilityPhasicitySpontaneityPropertiesSummary +---------+---------------+---------+-----------+----------+-------+ CFV      Full           Yes      Yes                          +---------+---------------+---------+-----------+----------+-------+ SFJ      Full           Yes      Yes                          +---------+---------------+---------+-----------+----------+-------+ FV Prox  Full                                                 +---------+---------------+---------+-----------+----------+-------+ FV Mid   Full                                                 +---------+---------------+---------+-----------+----------+-------+ FV DistalFull                                                 +---------+---------------+---------+-----------+----------+-------+  PFV      Full                                                 +---------+---------------+---------+-----------+----------+-------+ POP      Full           Yes      Yes                          +---------+---------------+---------+-----------+----------+-------+ PTV      Full                                                 +---------+---------------+---------+-----------+----------+-------+ PERO     Full                                                 +---------+---------------+---------+-----------+----------+-------+ Soleal   Full                                                 +---------+---------------+---------+-----------+----------+-------+ GSV      Full                                                 +---------+---------------+---------+-----------+----------+-------+  Left  Venous Findings: +---------+---------------+---------+-----------+----------+-------+          CompressibilityPhasicitySpontaneityPropertiesSummary +---------+---------------+---------+-----------+----------+-------+ CFV      Full           Yes      Yes                          +---------+---------------+---------+-----------+----------+-------+ SFJ      Full           Yes      Yes                          +---------+---------------+---------+-----------+----------+-------+ FV Prox  Full                                                 +---------+---------------+---------+-----------+----------+-------+ FV Mid   Full                                                 +---------+---------------+---------+-----------+----------+-------+ FV DistalFull                                                 +---------+---------------+---------+-----------+----------+-------+  PFV      Full                                                 +---------+---------------+---------+-----------+----------+-------+ POP      Full           Yes      Yes                          +---------+---------------+---------+-----------+----------+-------+ PTV      Full                                                 +---------+---------------+---------+-----------+----------+-------+ PERO     Full                                                 +---------+---------------+---------+-----------+----------+-------+ GSV      Full                                                 +---------+---------------+---------+-----------+----------+-------+    Summary: Right: There is no evidence of deep vein thrombosis in the lower extremity.There is no evidence of superficial venous thrombosis. Left: There is no evidence of deep vein thrombosis in the lower extremity.There is no evidence of superficial venous thrombosis.  *See table(s) above for measurements and observations. Electronically  signed by Servando Snare MD on 03/04/2018 at 11:26:05 AM.    Final    Ct Orbits W Contrast  Addendum Date: 03/06/2018   ADDENDUM REPORT: 03/06/2018 07:51 ADDENDUM: Clinical data as stated in the history was entered incorrectly by the technologist in the study notes. Patient has Adenocarcinoma of the RIGHT lung, stage IV, not small cell carcinoma. Electronically Signed   By: Staci Righter M.D.   On: 03/06/2018 07:51   Result Date: 03/06/2018 CLINICAL DATA:  Exophthalmos.  Small cell lung cancer. EXAM: CT HEAD AND ORBITS WITHOUT AND WITH CONTRAST TECHNIQUE: Contiguous axial images were obtained from the base of the skull through the vertex with and without contrast. Multidetector CT imaging of the orbits was performed using the standard protocol with and without intravenous contrast. CONTRAST:  42mL OMNIPAQUE IOHEXOL 300 MG/ML  SOLN COMPARISON:  MR head 12/20/2016. FINDINGS: CT HEAD FINDINGS Brain: No acute stroke or hemorrhage. There are multiple intracranial mass lesions consistent with metastases, which demonstrate homogeneous postcontrast enhancement. The largest measures 8 mm, LEFT periventricular location. BILATERAL frontal, parietal, and occipital metastases are observed. None are seen in the cerebellum or brainstem. Vasogenic edema is greatest in the LEFT occipital lobe. Vascular: Visible vessels are patent. Vascular calcification consistent with intracranial atherosclerotic disease. Skull: No calvarial destructive lesions. Other: None. CT ORBITS FINDINGS Orbits: No traumatic or inflammatory finding. Globes, optic nerves, orbital fat, extraocular muscles, vascular structures, and lacrimal glands are normal. Symmetric proptosis, uncertain significance. Doubt increased intracranial pressure. Visualized sinuses: Clear. Soft tissues: Unremarkable. IMPRESSION: CT head without and with contrast demonstrates BILATERAL subcentimeter supratentorial  intra-axial metastases, consistent with the pattern frequently  seen in small cell lung cancer. No midline shift, but asymmetric vasogenic edema is most pronounced in the LEFT occipital lobe. Detection of all metastases is more readily accomplished using pre and postcontrast MRI of the brain, a modality which displays increased sensitivity to smaller metastatic deposits. No osseous metastatic disease. 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 Significance uncertain. A call has been placed to the ordering provider. Electronically Signed: By: Staci Righter M.D. On: 03/04/2018 17:20    Impression/Plan: Brain metastases (15+)  Patient is  taking Dexamethasone 4mg  BID as of today.  Instructed to reduce to 1 tab daily on this day.  I had a lengthy discussion with the patient  after reviewing his MRI with him.  We spoke about whole brain radiotherapy versus stereotactic radiosurgery to the brain. We spoke about the differing risks benefits and side effects of both of these treatments.  I explained that whole brain radiotherapy is more comprehensive and therefore can decrease the chance of recurrences elsewhere in the brain while stereotactic radiosurgery only treats the areas of gross disease while sparing the rest of the brain parenchyma.  After lengthy discussion I recommend whole brain RT for 2 weeks due to the extensive disease in the brain and risk of elsewhere recurrences with SRS.  [name] enthusiastic about WBRT.   We discussed the risks, benefits, and side effects of radiotherapy. Side effects may include but not necessarily be limited to: hair loss, fatigue, headache, cognitive decline. No guarantees of treatment were given. A consent form was signed and placed in the patient's medical record. The patient was encouraged to ask questions that I answered to the best of my ability.   Will plan RT today and start it next week.   I spent 20 minutes face to face with the patient and more than 50% of that time was spent in counseling and/or coordination of  care. _____________________________________   Eppie Gibson, MD

## 2018-03-26 ENCOUNTER — Telehealth: Payer: Self-pay

## 2018-03-26 ENCOUNTER — Telehealth: Payer: Self-pay | Admitting: Family Medicine

## 2018-03-26 ENCOUNTER — Ambulatory Visit
Admission: RE | Admit: 2018-03-26 | Discharge: 2018-03-26 | Disposition: A | Discharge status: Home / Self Care | Payer: Medicare Other | Source: Ambulatory Visit | Attending: Radiation Oncology | Admitting: Radiation Oncology

## 2018-03-26 DIAGNOSIS — C7931 Secondary malignant neoplasm of brain: Secondary | ICD-10-CM | POA: Diagnosis not present

## 2018-03-26 NOTE — Telephone Encounter (Signed)
This is fine 

## 2018-03-26 NOTE — Telephone Encounter (Signed)
  Bradley Maldonado with [facility]  [phone] Called back and stated that she actually dod not need the additional visits There is no other skilled need. She stated that he is starting radiation  If there is any need and they need to go back, please call

## 2018-03-26 NOTE — Telephone Encounter (Signed)
Left detailed message on dee ok more visits with patient

## 2018-03-26 NOTE — Telephone Encounter (Signed)
Dee called from [facility] requesting verbal orders for 2 more visits with patient.   Karena Addison can be contacted at [phone]

## 2018-03-27 ENCOUNTER — Ambulatory Visit
Admission: RE | Admit: 2018-03-27 | Discharge: 2018-03-27 | Disposition: A | Discharge status: Home / Self Care | Payer: Medicare Other | Source: Ambulatory Visit | Attending: Radiation Oncology | Admitting: Radiation Oncology

## 2018-03-27 DIAGNOSIS — C7931 Secondary malignant neoplasm of brain: Secondary | ICD-10-CM | POA: Diagnosis not present

## 2018-03-28 ENCOUNTER — Ambulatory Visit
Admission: RE | Admit: 2018-03-28 | Discharge: 2018-03-28 | Disposition: A | Discharge status: Home / Self Care | Payer: Medicare Other | Source: Ambulatory Visit | Attending: Radiation Oncology | Admitting: Radiation Oncology

## 2018-03-28 ENCOUNTER — Ambulatory Visit: Payer: Medicare Other | Admitting: Family Medicine

## 2018-03-28 DIAGNOSIS — C7931 Secondary malignant neoplasm of brain: Secondary | ICD-10-CM | POA: Diagnosis not present

## 2018-03-29 ENCOUNTER — Ambulatory Visit
Admission: RE | Admit: 2018-03-29 | Discharge: 2018-03-29 | Disposition: A | Discharge status: Home / Self Care | Payer: Medicare Other | Source: Ambulatory Visit | Attending: Radiation Oncology | Admitting: Radiation Oncology

## 2018-03-29 DIAGNOSIS — C7931 Secondary malignant neoplasm of brain: Secondary | ICD-10-CM | POA: Diagnosis not present

## 2018-03-30 ENCOUNTER — Other Ambulatory Visit: Payer: Self-pay | Admitting: Medical Oncology

## 2018-03-30 ENCOUNTER — Ambulatory Visit
Admission: RE | Admit: 2018-03-30 | Discharge: 2018-03-30 | Disposition: A | Discharge status: Home / Self Care | Payer: Medicare Other | Source: Ambulatory Visit | Attending: Radiation Oncology | Admitting: Radiation Oncology

## 2018-03-30 DIAGNOSIS — C7931 Secondary malignant neoplasm of brain: Secondary | ICD-10-CM

## 2018-03-30 MED ORDER — DEXAMETHASONE 4 MG PO TABS: 4.0000 mg | ORAL_TABLET | Freq: Two times a day (BID) | ORAL | 0 refills | Status: DC

## 2018-03-30 NOTE — Progress Notes (Signed)
PT instructed to pick up decadron rx and to take 1 tablet bid and keep appt with [facility] next week.

## 2018-04-02 ENCOUNTER — Ambulatory Visit
Admission: RE | Admit: 2018-04-02 | Discharge: 2018-04-02 | Disposition: A | Discharge status: Home / Self Care | Payer: Medicare Other | Source: Ambulatory Visit | Attending: Radiation Oncology | Admitting: Radiation Oncology

## 2018-04-02 ENCOUNTER — Other Ambulatory Visit: Payer: Self-pay | Admitting: Radiation Oncology

## 2018-04-02 DIAGNOSIS — C7931 Secondary malignant neoplasm of brain: Secondary | ICD-10-CM | POA: Diagnosis not present

## 2018-04-02 MED ORDER — DEXAMETHASONE 4 MG PO TABS: ORAL_TABLET | ORAL | 0 refills | Status: DC

## 2018-04-03 ENCOUNTER — Ambulatory Visit
Admission: RE | Admit: 2018-04-03 | Discharge: 2018-04-03 | Disposition: A | Discharge status: Home / Self Care | Payer: Medicare Other | Source: Ambulatory Visit | Attending: Radiation Oncology | Admitting: Radiation Oncology

## 2018-04-03 ENCOUNTER — Other Ambulatory Visit: Payer: Self-pay | Admitting: Medical Oncology

## 2018-04-03 DIAGNOSIS — C3491 Malignant neoplasm of unspecified part of right bronchus or lung: Secondary | ICD-10-CM

## 2018-04-03 DIAGNOSIS — C7931 Secondary malignant neoplasm of brain: Secondary | ICD-10-CM | POA: Diagnosis not present

## 2018-04-04 ENCOUNTER — Ambulatory Visit
Admission: RE | Admit: 2018-04-04 | Discharge: 2018-04-04 | Disposition: A | Discharge status: Home / Self Care | Payer: Medicare Other | Source: Ambulatory Visit | Attending: Radiation Oncology | Admitting: Radiation Oncology

## 2018-04-04 ENCOUNTER — Inpatient Hospital Stay: Payer: Medicare Other | Attending: Internal Medicine | Admitting: Internal Medicine

## 2018-04-04 ENCOUNTER — Encounter: Payer: Self-pay | Admitting: Internal Medicine

## 2018-04-04 ENCOUNTER — Inpatient Hospital Stay: Payer: Medicare Other

## 2018-04-04 VITALS — BP 120/73 | HR 102 | Temp 98.1°F | Resp 12 | Ht 70.0 in | Wt 151.7 lb

## 2018-04-04 DIAGNOSIS — I2699 Other pulmonary embolism without acute cor pulmonale: Secondary | ICD-10-CM | POA: Insufficient documentation

## 2018-04-04 DIAGNOSIS — C7931 Secondary malignant neoplasm of brain: Secondary | ICD-10-CM | POA: Diagnosis not present

## 2018-04-04 DIAGNOSIS — C3491 Malignant neoplasm of unspecified part of right bronchus or lung: Secondary | ICD-10-CM

## 2018-04-04 DIAGNOSIS — Z79899 Other long term (current) drug therapy: Secondary | ICD-10-CM | POA: Diagnosis not present

## 2018-04-04 DIAGNOSIS — Z7901 Long term (current) use of anticoagulants: Secondary | ICD-10-CM | POA: Diagnosis not present

## 2018-04-04 DIAGNOSIS — J910 Malignant pleural effusion: Secondary | ICD-10-CM | POA: Diagnosis not present

## 2018-04-04 DIAGNOSIS — R5383 Other fatigue: Secondary | ICD-10-CM | POA: Diagnosis not present

## 2018-04-04 DIAGNOSIS — I10 Essential (primary) hypertension: Secondary | ICD-10-CM | POA: Diagnosis not present

## 2018-04-04 DIAGNOSIS — Z5111 Encounter for antineoplastic chemotherapy: Secondary | ICD-10-CM | POA: Insufficient documentation

## 2018-04-04 DIAGNOSIS — Z7189 Other specified counseling: Secondary | ICD-10-CM

## 2018-04-04 DIAGNOSIS — C3411 Malignant neoplasm of upper lobe, right bronchus or lung: Secondary | ICD-10-CM | POA: Diagnosis present

## 2018-04-04 LAB — CMP (CANCER CENTER ONLY)
ALBUMIN: 2.8 g/dL — AB (ref 3.5–5.0)
ALK PHOS: 84 U/L (ref 38–126)
ALT: 23 U/L (ref 0–44)
ANION GAP: 10 (ref 5–15)
AST: 14 U/L — ABNORMAL LOW (ref 15–41)
BUN: 18 mg/dL (ref 8–23)
CALCIUM: 8.7 mg/dL — AB (ref 8.9–10.3)
CO2: 30 mmol/L (ref 22–32)
CREATININE: 0.82 mg/dL (ref 0.61–1.24)
Chloride: 96 mmol/L — ABNORMAL LOW (ref 98–111)
GFR, Est AFR Am: >60 mL/min (ref >60)
GFR, Estimated: >60 mL/min (ref >60)
GLUCOSE: 430 mg/dL — AB (ref 70–99)
Potassium: 4 mmol/L (ref 3.5–5.1)
SODIUM: 136 mmol/L (ref 135–145)
Total Bilirubin: 0.4 mg/dL (ref 0.3–1.2)
Total Protein: 5.7 g/dL — ABNORMAL LOW (ref 6.5–8.1)

## 2018-04-04 LAB — CBC WITH DIFFERENTIAL (CANCER CENTER ONLY)
Abs Immature Granulocytes: 0.13 K/uL — ABNORMAL HIGH (ref 0.00–0.07)
Basophils Absolute: 0 K/uL (ref 0.0–0.1)
Basophils Relative: 0 %
Eosinophils Absolute: 0.1 K/uL (ref 0.0–0.5)
Eosinophils Relative: 1 %
HCT: 46.1 % (ref 39.0–52.0)
Hemoglobin: 14.9 g/dL (ref 13.0–17.0)
Immature Granulocytes: 2 %
Lymphocytes Relative: 26 %
Lymphs Abs: 1.9 K/uL (ref 0.7–4.0)
MCH: 28.3 pg (ref 26.0–34.0)
MCHC: 32.3 g/dL (ref 30.0–36.0)
MCV: 87.6 fL (ref 80.0–100.0)
Monocytes Absolute: 0.6 K/uL (ref 0.1–1.0)
Monocytes Relative: 8 %
Neutro Abs: 4.5 K/uL (ref 1.7–7.7)
Neutrophils Relative %: 63 %
Platelet Count: 113 K/uL — ABNORMAL LOW (ref 150–400)
RBC: 5.26 MIL/uL (ref 4.22–5.81)
RDW: 16.9 % — ABNORMAL HIGH (ref 11.5–15.5)
WBC Count: 7.2 K/uL (ref 4.0–10.5)
nRBC: 0 % (ref 0.0–0.2)

## 2018-04-04 MED ORDER — FOLIC ACID 1 MG PO TABS: 1.0000 mg | ORAL_TABLET | Freq: Every day | ORAL | 4 refills | Status: DC

## 2018-04-04 MED ORDER — PROCHLORPERAZINE MALEATE 10 MG PO TABS: 10.0000 mg | ORAL_TABLET | Freq: Four times a day (QID) | ORAL | 0 refills | Status: DC | PRN

## 2018-04-04 MED ORDER — CYANOCOBALAMIN 1000 MCG/ML IJ SOLN
1000.0000 ug | Freq: Once | INTRAMUSCULAR | Status: AC
  Administered 2018-04-04: 1000 ug via INTRAMUSCULAR

## 2018-04-04 MED ORDER — DEXAMETHASONE 4 MG PO TABS: ORAL_TABLET | ORAL | 0 refills | Status: DC

## 2018-04-04 MED ORDER — CYANOCOBALAMIN 1000 MCG/ML IJ SOLN
INTRAMUSCULAR | Status: AC
  Filled 2018-04-04: qty 1

## 2018-04-04 NOTE — Progress Notes (Signed)
[facility] [phone]   Fax:([phone]) [phone]  OFFICE PROGRESS NOTE  [name], NP-C [place] [address]  DIAGNOSIS: Stage IV (T3, N1, M1a) non-small cell lung cancer, adenocarcinoma diagnosed in July 2018 and presented with a right hilar lymphadenopathy and malignant right pleural effusion diagnosed in July 2018. Molecular studies were negative for EGFR, ALK, ROS 1 and BRAF mutations. PDL 1 expression was 50%  PRIOR THERAPY:   1) Ketruda (pembrolizumab) 200 mg IV every 3 weeks, status post 17 cycles.  Last dose was giving February 22, 2018 discontinued secondary to disease progression with multiple brain metastases. 2) whole brain irradiation for multiple metastatic brain lesions under the care of Dr. Isidore Moos expected to be completed on April 06, 2018.  CURRENT THERAPY: Systemic chemotherapy with carboplatin for AUC of 5 and Alimta 500 mg/M2 every 3 weeks.  First dose 04/11/2018.  INTERVAL HISTORY: Bradley Maldonado 67 y.o. male returns to the clinic today for follow-up visit accompanied by his aunt.  The patient is feeling fine today with no specific complaints except for fatigue.  He denied having any current chest pain but has shortness of breath with exertion and mild cough with no hemoptysis.  He denied having any recent weight loss or night sweats.  He has no nausea, vomiting, diarrhea or constipation.  [name] currently undergoing palliative radiotherapy to the metastatic brain lesions under the care of Dr. Isidore Moos and he is expected to complete his radiation therapy on 04/06/2018.  The patient is here today for evaluation and discussion of his treatment options.   MEDICAL HISTORY: Past Medical History:  Diagnosis Date  . Adenocarcinoma of right lung, stage 4 ([place]) 01/24/2017  . Goals of care, counseling/discussion 01/24/2017  . Hypertension     ALLERGIES:  has No Known Allergies.  MEDICATIONS:  Current Outpatient  Medications  Medication Sig Dispense Refill  . carvedilol (COREG) 3.125 MG tablet Take 1 tablet (3.125 mg total) by mouth 2 (two) times daily with a meal. 60 tablet 0  . dexamethasone (DECADRON) 4 MG tablet Take 1 tab twice daily. On 11-14 reduce dose to one tab daily. On 11-21 reduce dose to 1/2 tab daily. Last dose on 12-1. 12 tablet 0  . diazepam (VALIUM) 5 MG tablet Take 1 tablet PO 45 minutes before MRI, and 45 minutes before mask fabrication, and 45 minutes before brain radiation therapy. 3 tablet 0  . ELIQUIS STARTER PACK (ELIQUIS STARTER PACK) 5 MG TABS Take as directed on package: start with two-'5mg'$  tablets twice daily for 7 days. On day 8, switch to one-'5mg'$  tablet twice daily. 1 each 0  . fluconazole (DIFLUCAN) 100 MG tablet Take 2 tablets today, then 1 tablet daily x 20 more days. 22 tablet 0  . LORazepam (ATIVAN) 1 MG tablet Take 1 tablet by mouth 20-30 min before radiation therapy. 10 tablet 0  . losartan (COZAAR) 25 MG tablet Take 1 tablet (25 mg total) by mouth daily. 30 tablet 0  . polyethylene glycol (MIRALAX / GLYCOLAX) packet Take 17 g by mouth daily. 14 each 0  . senna-docusate (SENOKOT-S) 8.6-50 MG tablet Take 1 tablet by mouth at bedtime. 10 tablet 0   No current facility-administered medications for this visit.     SURGICAL HISTORY:  Past Surgical History:  Procedure Laterality Date  . CHEST TUBE INSERTION Right 02/06/2017   Procedure: INSERTION PLEURAL DRAINAGE CATHETER;  Surgeon: Ivin Poot, MD;  Location: [facility];  Service: Thoracic;  Laterality: Right;  . EYE SURGERY Right    metal removed from eye  . Hip replacement    . IR THORACENTESIS ASP PLEURAL SPACE W/IMG GUIDE  12/19/2016  . REMOVAL OF PLEURAL DRAINAGE CATHETER Right 03/21/2017   Procedure: REMOVAL OF RIGHT PLEURAL DRAINAGE CATHETER;  Surgeon: Ivin Poot, MD;  Location: [facility];  Service: Thoracic;  Laterality: Right;    REVIEW OF SYSTEMS:  Constitutional: positive for fatigue Eyes:  negative Ears, nose, mouth, throat, and face: negative Respiratory: positive for dyspnea on exertion Cardiovascular: negative Gastrointestinal: negative Genitourinary:negative Integument/breast: negative Hematologic/lymphatic: negative Musculoskeletal:negative Neurological: negative Behavioral/Psych: negative Endocrine: negative Allergic/Immunologic: negative   PHYSICAL EXAMINATION: General appearance: alert, cooperative, fatigued and no distress Head: Normocephalic, without obvious abnormality, atraumatic Neck: no adenopathy, no JVD, supple, symmetrical, trachea midline and thyroid not enlarged, symmetric, no tenderness/mass/nodules Lymph nodes: Cervical, supraclavicular, and axillary nodes normal. Resp: clear to auscultation bilaterally Back: symmetric, no curvature. ROM normal. No CVA tenderness. Cardio: regular rate and rhythm, S1, S2 normal, no murmur, click, rub or gallop GI: soft, non-tender; bowel sounds normal; no masses,  no organomegaly Extremities: extremities normal, atraumatic, no cyanosis or edema Neurologic: Alert and oriented X 3, normal strength and tone. Normal symmetric reflexes. Normal coordination and gait  ECOG PERFORMANCE STATUS: 1 - Symptomatic but completely ambulatory  Blood pressure 120/73, pulse (!) 102, temperature 98.1 F (36.7 C), temperature source Oral, resp. rate 12, height '5\' 10"'$  (1.778 m), weight 151 lb 11.2 oz (68.8 kg), SpO2 92 %.  LABORATORY DATA: Lab Results  Component Value Date   WBC 7.2 04/04/2018   HGB 14.9 04/04/2018   HCT 46.1 04/04/2018   MCV 87.6 04/04/2018   PLT 113 (L) 04/04/2018      Chemistry      Component Value Date/Time   NA 138 03/15/2018 0909   NA 138 05/04/2017 1011   K 4.2 03/15/2018 0909   K 3.9 05/04/2017 1011   CL 98 03/15/2018 0909   CO2 28 03/15/2018 0909   CO2 24 05/04/2017 1011   BUN 18 03/15/2018 0909   BUN 18.4 05/04/2017 1011   CREATININE 0.74 03/15/2018 0909   CREATININE 1.0 05/04/2017 1011       Component Value Date/Time   CALCIUM 8.9 03/15/2018 0909   CALCIUM 9.3 05/04/2017 1011   ALKPHOS 81 03/15/2018 0909   ALKPHOS 91 05/04/2017 1011   AST 12 (L) 03/15/2018 0909   AST 23 05/04/2017 1011   ALT 23 03/15/2018 0909   ALT 19 05/04/2017 1011   BILITOT 0.4 03/15/2018 0909   BILITOT 0.39 05/04/2017 1011       RADIOGRAPHIC STUDIES: Mr Jeri Cos DP Contrast  Result Date: 03/20/2018 CLINICAL DATA:  Metastatic lung cancer. Stereotactic radio surgery planning EXAM: MRI HEAD WITHOUT AND WITH CONTRAST TECHNIQUE: Multiplanar, multiecho pulse sequences of the brain and surrounding structures were obtained without and with intravenous contrast. CONTRAST:  20m MULTIHANCE GADOBENATE DIMEGLUMINE 529 MG/ML IV SOLN COMPARISON:  CT head 03/04/2018, MRI 12/20/2016 FINDINGS: Brain: Multiple metastatic deposits throughout the brain. Image quality degraded by motion. It is quite possible there additional small metastatic deposits present which are not definitively identified on the study due to motion. Several lesions show mild hemorrhage including left occipital lesion, right posterior parietal lesion, and right frontal lesion. No significant edema or midline shift. Individual lesions are annotated on axial postcontrast images. 3 mm left posterior temporal lesion 4 mm right posterior temporal lesion 7 mm left occipital lesion with mild  hemorrhage 12 mm left occipital lesion 4.5 mm left parietal lesion 12 mm left parietal white matter lesion 10 mm right posterior parietal lesion 3 mm right parietal white matter lesion 7 mm right frontal lesion 6 mm right frontal cortical lesion 3 mm right posterior frontal white matter lesion 9 mm right frontal convexity lesion 8.3 mm right frontal parietal convexity lesion 10 x 7 mm right posterior frontal cortical lesion 3 mm left parietal convexity lesion Ventricle size normal. No midline shift. Negative for acute infarct. Vascular: Normal arterial flow voids Skull and upper  cervical spine: Negative for bony disease. Sinuses/Orbits: Negative Other: None IMPRESSION: At least 15 metastatic deposits are present in the brain. Several show mild hemorrhage. Minimal edema. Image quality degraded by motion especially on the postcontrast images which could obscure additional small lesions. Electronically Signed   By: Franchot Gallo M.D.   On: 03/20/2018 08:03    ASSESSMENT AND PLAN: This is a very pleasant 67 years old African-American male with a stage IV non-small cell lung cancer, adenocarcinoma with PDL 1 expression of 50% and negative for actionable mutations. The patient is currently undergoing treatment with immunotherapy with Keytruda 200 mg IV every 3 weeks status post 17 cycles.  The patient has been tolerating this treatment well with no concerning adverse effects.  Unfortunately he was found recently to have disease progression with multiple brain metastasis. [name] currently undergoing whole brain irradiation and expected to be completed April 06, 2018. I had a lengthy discussion with the patient today about his current disease status and treatment options.  I gave the patient the option of palliative systemic chemotherapy with carboplatin for AUC of 5 and Alimta 500 mg/M2 every 3 weeks versus palliative care and hospice referral. The patient is interested in proceeding with systemic chemotherapy.  I discussed with him the adverse effect of this treatment including but not limited to alopecia, myelosuppression, nausea and vomiting, peripheral neuropathy, liver or renal dysfunction.  [name] expected to start the first cycle of this treatment on April 11, 2018. The patient will receive vitamin B12 injection today.  I will also send prescription for folic acid 1 mg p.o. daily in addition to Compazine 10 mg p.o. every 6 hours as needed for nausea and Decadron 4 mg p.o. twice daily the day before, day of and day after chemotherapy every 3 weeks. I will see the patient back  for follow-up visit in 4 weeks for evaluation before starting cycle #2 of his treatment. He was also recently diagnosed with pulmonary embolism and currently on treatment with Eliquis.  I recommended for the patient to continue his current treatment. He was advised to call immediately if he has any concerning symptoms in the interval. The patient voices understanding of current disease status and treatment options and is in agreement with the current care plan. All questions were answered. The patient knows to call the clinic with any problems, questions or concerns. We can certainly see the patient much sooner if necessary.  Disclaimer: This note was dictated with voice recognition software. Similar sounding words can inadvertently be transcribed and may not be corrected upon review.

## 2018-04-04 NOTE — Progress Notes (Signed)
DISCONTINUE ON PATHWAY REGIMEN - Non-Small Cell Lung     A cycle is 21 days:     Pembrolizumab   **Always confirm dose/schedule in your pharmacy ordering system**  REASON: Disease Progression PRIOR TREATMENT: [id number]: Pembrolizumab 200 mg q21 Days Until Disease Progression, Unacceptable Toxicity, or up to 24 Months TREATMENT RESPONSE: Progressive Disease (PD)  START ON PATHWAY REGIMEN - Non-Small Cell Lung     A cycle is every 21 days:     Pemetrexed      Carboplatin   **Always confirm dose/schedule in your pharmacy ordering system**  Patient Characteristics: Stage IV Metastatic, Nonsquamous, Initial Chemotherapy/Immunotherapy, PS = 0, 1, ALK Translocation Negative/Unknown and EGFR Mutation Negative/Non-Sensitizing/Unknown, Not a Candidate for Immunotherapy AJCC T Category: T3 Current Disease Status: Distant Metastases AJCC N Category: N3 AJCC M Category: M1a AJCC 8 Stage Grouping: IVA Histology: Nonsquamous Cell ROS1 Rearrangement Status: Negative T790M Mutation Status: Not Applicable - EGFR Mutation Negative/Unknown Other Mutations/Biomarkers: No Other Actionable Mutations NTRK Gene Fusion Status: Negative PD-L1 Expression Status: PD-L1 Positive ? 50% (TPS) Chemotherapy/Immunotherapy LOT: Initial Chemotherapy/Immunotherapy Molecular Targeted Therapy: Not Appropriate ALK Translocation Status: Negative EGFR Mutation Status: Negative/Wild Type BRAF [id number] Mutation Status: Negative Performance Status: PS = 0, 1 Immunotherapy Candidate Status: Not a Candidate for Immunotherapy Intent of Therapy: Non-Curative / Palliative Intent, Discussed with Patient

## 2018-04-05 ENCOUNTER — Ambulatory Visit
Admission: RE | Admit: 2018-04-05 | Discharge: 2018-04-05 | Disposition: A | Discharge status: Home / Self Care | Payer: Medicare Other | Source: Ambulatory Visit | Attending: Radiation Oncology | Admitting: Radiation Oncology

## 2018-04-05 ENCOUNTER — Ambulatory Visit: Payer: Medicare Other

## 2018-04-05 ENCOUNTER — Ambulatory Visit: Payer: Medicare Other | Admitting: Oncology

## 2018-04-05 ENCOUNTER — Other Ambulatory Visit: Payer: Medicare Other

## 2018-04-05 DIAGNOSIS — C7931 Secondary malignant neoplasm of brain: Secondary | ICD-10-CM | POA: Diagnosis not present

## 2018-04-06 ENCOUNTER — Telehealth: Payer: Self-pay | Admitting: Oncology

## 2018-04-06 ENCOUNTER — Ambulatory Visit
Admission: RE | Admit: 2018-04-06 | Discharge: 2018-04-06 | Disposition: A | Discharge status: Home / Self Care | Payer: Medicare Other | Source: Ambulatory Visit | Attending: Radiation Oncology | Admitting: Radiation Oncology

## 2018-04-06 ENCOUNTER — Encounter: Payer: Self-pay | Admitting: Radiation Oncology

## 2018-04-06 DIAGNOSIS — C7931 Secondary malignant neoplasm of brain: Secondary | ICD-10-CM | POA: Diagnosis not present

## 2018-04-06 NOTE — Telephone Encounter (Signed)
Tried to reach about 11/22

## 2018-04-10 NOTE — Progress Notes (Signed)
  Radiation Oncology         [phone] ________________________________  Name: Bradley Maldonado MRN: [id number]  Date: 04/06/2018  DOB: [birth year]  End of Treatment Note  Diagnosis:   67 y.o. male with Brain metastases (15+)   Indication for treatment:  Palliative       Radiation treatment dates:   03/26/2018 - 04/06/2018  Site/dose:   Whole Brain / 30 Gy in 10 fractions  Beams/energy:   Isodose Plan / 6X Photon  Narrative: The patient tolerated radiation treatment relatively well.  He experienced some mild fatigue as he progressed through treatment. He reports unsteady gait. He states that he is currently taking Decadron 4 mg 2 times per day. He was given a Decadron taper plan to be completed on December 1st.  Plan: The patient has completed radiation treatment. The patient will return to radiation oncology clinic for routine followup in one month. I advised them to call or return sooner if they have any questions or concerns related to their recovery or treatment.  -----------------------------------  Eppie Gibson, MD  This document serves as a record of services personally performed by Eppie Gibson, MD. It was created on her behalf by Rae Lips, a trained medical scribe. The creation of this record is based on the scribe's personal observations and the provider's statements to them. This document has been checked and approved by the attending provider.

## 2018-04-11 ENCOUNTER — Encounter: Payer: Self-pay | Admitting: Pharmacist

## 2018-04-12 ENCOUNTER — Ambulatory Visit: Payer: Medicare Other | Admitting: Family Medicine

## 2018-04-13 ENCOUNTER — Inpatient Hospital Stay: Payer: Medicare Other

## 2018-04-13 NOTE — Telephone Encounter (Signed)
Recv'd denial from [employer] pt's insurance covered Eliquis so they are unable to assist pt.  With his insurance cost is $140 for 30 days and $2230 for 90 days.  Called pt and informed and he will pick up Rx

## 2018-04-16 ENCOUNTER — Telehealth: Payer: Self-pay

## 2018-04-16 NOTE — Telephone Encounter (Signed)
Pt has appts on the schedule for cancer center. Pt was advise to call and find out if his appts are correct and what they are for. Pt was given number to cancer center to call

## 2018-04-16 NOTE — Telephone Encounter (Signed)
Patient called stating he was referred to Dr. Julien Nordmann but he accidentally canceled all his appointments. He wants to know if you can call them and reschedule his appt. He prefer to go in this week on Wednesday. If there is no appt available for that day he is ok to take the next available appt. He would like a call back once he has been scheduled.

## 2018-04-20 ENCOUNTER — Inpatient Hospital Stay: Payer: Medicare Other

## 2018-04-26 ENCOUNTER — Ambulatory Visit: Payer: Medicare Other

## 2018-04-26 ENCOUNTER — Other Ambulatory Visit: Payer: Medicare Other

## 2018-04-26 ENCOUNTER — Ambulatory Visit: Payer: Medicare Other | Admitting: Internal Medicine

## 2018-04-27 ENCOUNTER — Inpatient Hospital Stay (HOSPITAL_COMMUNITY)
Admission: EM | Admit: 2018-04-27 | Discharge: 2018-05-15 | DRG: 189 | Disposition: A | Discharge status: Hospice | Payer: Medicare Other | Attending: Internal Medicine | Admitting: Internal Medicine

## 2018-04-27 ENCOUNTER — Inpatient Hospital Stay: Payer: Medicare Other | Attending: Internal Medicine

## 2018-04-27 ENCOUNTER — Ambulatory Visit (HOSPITAL_BASED_OUTPATIENT_CLINIC_OR_DEPARTMENT_OTHER): Payer: Medicare Other | Admitting: Medical

## 2018-04-27 ENCOUNTER — Encounter (HOSPITAL_COMMUNITY): Payer: Self-pay | Admitting: Emergency Medicine

## 2018-04-27 ENCOUNTER — Other Ambulatory Visit: Payer: Self-pay

## 2018-04-27 ENCOUNTER — Emergency Department (HOSPITAL_COMMUNITY): Payer: Medicare Other

## 2018-04-27 DIAGNOSIS — K5900 Constipation, unspecified: Secondary | ICD-10-CM | POA: Diagnosis present

## 2018-04-27 DIAGNOSIS — C7931 Secondary malignant neoplasm of brain: Secondary | ICD-10-CM

## 2018-04-27 DIAGNOSIS — J9690 Respiratory failure, unspecified, unspecified whether with hypoxia or hypercapnia: Secondary | ICD-10-CM | POA: Diagnosis not present

## 2018-04-27 DIAGNOSIS — C7951 Secondary malignant neoplasm of bone: Secondary | ICD-10-CM | POA: Diagnosis present

## 2018-04-27 DIAGNOSIS — R402253 Coma scale, best verbal response, oriented, at hospital admission: Secondary | ICD-10-CM | POA: Diagnosis present

## 2018-04-27 DIAGNOSIS — R402143 Coma scale, eyes open, spontaneous, at hospital admission: Secondary | ICD-10-CM | POA: Diagnosis present

## 2018-04-27 DIAGNOSIS — R64 Cachexia: Secondary | ICD-10-CM | POA: Diagnosis present

## 2018-04-27 DIAGNOSIS — I5023 Acute on chronic systolic (congestive) heart failure: Secondary | ICD-10-CM | POA: Diagnosis not present

## 2018-04-27 DIAGNOSIS — Z7901 Long term (current) use of anticoagulants: Secondary | ICD-10-CM | POA: Diagnosis not present

## 2018-04-27 DIAGNOSIS — I2609 Other pulmonary embolism with acute cor pulmonale: Secondary | ICD-10-CM

## 2018-04-27 DIAGNOSIS — Z86711 Personal history of pulmonary embolism: Secondary | ICD-10-CM | POA: Diagnosis not present

## 2018-04-27 DIAGNOSIS — I2699 Other pulmonary embolism without acute cor pulmonale: Secondary | ICD-10-CM | POA: Diagnosis present

## 2018-04-27 DIAGNOSIS — C3432 Malignant neoplasm of lower lobe, left bronchus or lung: Secondary | ICD-10-CM | POA: Diagnosis not present

## 2018-04-27 DIAGNOSIS — F419 Anxiety disorder, unspecified: Secondary | ICD-10-CM | POA: Diagnosis present

## 2018-04-27 DIAGNOSIS — E43 Unspecified severe protein-calorie malnutrition: Secondary | ICD-10-CM | POA: Diagnosis present

## 2018-04-27 DIAGNOSIS — I10 Essential (primary) hypertension: Secondary | ICD-10-CM

## 2018-04-27 DIAGNOSIS — Z9989 Dependence on other enabling machines and devices: Secondary | ICD-10-CM

## 2018-04-27 DIAGNOSIS — I428 Other cardiomyopathies: Secondary | ICD-10-CM | POA: Diagnosis present

## 2018-04-27 DIAGNOSIS — R000 Tachycardia, unspecified: Secondary | ICD-10-CM | POA: Diagnosis not present

## 2018-04-27 DIAGNOSIS — R040 Epistaxis: Secondary | ICD-10-CM | POA: Diagnosis not present

## 2018-04-27 DIAGNOSIS — J9811 Atelectasis: Secondary | ICD-10-CM | POA: Diagnosis present

## 2018-04-27 DIAGNOSIS — Z66 Do not resuscitate: Secondary | ICD-10-CM | POA: Diagnosis not present

## 2018-04-27 DIAGNOSIS — C3491 Malignant neoplasm of unspecified part of right bronchus or lung: Secondary | ICD-10-CM | POA: Diagnosis not present

## 2018-04-27 DIAGNOSIS — Z681 Body mass index (BMI) 19 or less, adult: Secondary | ICD-10-CM | POA: Diagnosis not present

## 2018-04-27 DIAGNOSIS — C342 Malignant neoplasm of middle lobe, bronchus or lung: Secondary | ICD-10-CM

## 2018-04-27 DIAGNOSIS — C7800 Secondary malignant neoplasm of unspecified lung: Secondary | ICD-10-CM | POA: Diagnosis present

## 2018-04-27 DIAGNOSIS — H0520 Unspecified exophthalmos: Secondary | ICD-10-CM | POA: Diagnosis present

## 2018-04-27 DIAGNOSIS — Z96649 Presence of unspecified artificial hip joint: Secondary | ICD-10-CM | POA: Diagnosis present

## 2018-04-27 DIAGNOSIS — J9600 Acute respiratory failure, unspecified whether with hypoxia or hypercapnia: Secondary | ICD-10-CM | POA: Diagnosis not present

## 2018-04-27 DIAGNOSIS — Z6820 Body mass index (BMI) 20.0-20.9, adult: Secondary | ICD-10-CM | POA: Diagnosis not present

## 2018-04-27 DIAGNOSIS — Z515 Encounter for palliative care: Secondary | ICD-10-CM | POA: Diagnosis not present

## 2018-04-27 DIAGNOSIS — Z7189 Other specified counseling: Secondary | ICD-10-CM

## 2018-04-27 DIAGNOSIS — I110 Hypertensive heart disease with heart failure: Secondary | ICD-10-CM | POA: Diagnosis present

## 2018-04-27 DIAGNOSIS — I429 Cardiomyopathy, unspecified: Secondary | ICD-10-CM | POA: Diagnosis not present

## 2018-04-27 DIAGNOSIS — J432 Centrilobular emphysema: Secondary | ICD-10-CM | POA: Diagnosis present

## 2018-04-27 DIAGNOSIS — Z87891 Personal history of nicotine dependence: Secondary | ICD-10-CM

## 2018-04-27 DIAGNOSIS — E440 Moderate protein-calorie malnutrition: Secondary | ICD-10-CM

## 2018-04-27 DIAGNOSIS — R0602 Shortness of breath: Secondary | ICD-10-CM

## 2018-04-27 DIAGNOSIS — J9621 Acute and chronic respiratory failure with hypoxia: Principal | ICD-10-CM

## 2018-04-27 DIAGNOSIS — J90 Pleural effusion, not elsewhere classified: Secondary | ICD-10-CM | POA: Diagnosis not present

## 2018-04-27 DIAGNOSIS — Z9221 Personal history of antineoplastic chemotherapy: Secondary | ICD-10-CM

## 2018-04-27 DIAGNOSIS — J189 Pneumonia, unspecified organism: Secondary | ICD-10-CM | POA: Diagnosis present

## 2018-04-27 DIAGNOSIS — Z79899 Other long term (current) drug therapy: Secondary | ICD-10-CM

## 2018-04-27 DIAGNOSIS — R402363 Coma scale, best motor response, obeys commands, at hospital admission: Secondary | ICD-10-CM | POA: Diagnosis present

## 2018-04-27 DIAGNOSIS — Z7401 Bed confinement status: Secondary | ICD-10-CM

## 2018-04-27 DIAGNOSIS — Z923 Personal history of irradiation: Secondary | ICD-10-CM

## 2018-04-27 DIAGNOSIS — I5022 Chronic systolic (congestive) heart failure: Secondary | ICD-10-CM | POA: Diagnosis present

## 2018-04-27 DIAGNOSIS — J181 Lobar pneumonia, unspecified organism: Secondary | ICD-10-CM

## 2018-04-27 DIAGNOSIS — R0902 Hypoxemia: Secondary | ICD-10-CM | POA: Diagnosis not present

## 2018-04-27 DIAGNOSIS — Z751 Person awaiting admission to adequate facility elsewhere: Secondary | ICD-10-CM

## 2018-04-27 DIAGNOSIS — F17201 Nicotine dependence, unspecified, in remission: Secondary | ICD-10-CM | POA: Diagnosis not present

## 2018-04-27 HISTORY — DX: Respiratory failure, unspecified, unspecified whether with hypoxia or hypercapnia: J96.90

## 2018-04-27 LAB — BASIC METABOLIC PANEL
Anion gap: 10 (ref 5–15)
BUN: 7 mg/dL — ABNORMAL LOW (ref 8–23)
CHLORIDE: 100 mmol/L (ref 98–111)
CO2: 32 mmol/L (ref 22–32)
Calcium: 8.4 mg/dL — ABNORMAL LOW (ref 8.9–10.3)
Creatinine, Ser: 0.63 mg/dL (ref 0.61–1.24)
GFR calc Af Amer: >60 mL/min (ref >60)
GFR calc non Af Amer: >60 mL/min (ref >60)
Glucose, Bld: 135 mg/dL — ABNORMAL HIGH (ref 70–99)
Potassium: 3.4 mmol/L — ABNORMAL LOW (ref 3.5–5.1)
Sodium: 142 mmol/L (ref 135–145)

## 2018-04-27 LAB — BLOOD GAS, VENOUS
Acid-Base Excess: 5.6 mmol/L — ABNORMAL HIGH (ref 0.0–2.0)
Bicarbonate: 34.2 mmol/L — ABNORMAL HIGH (ref 20.0–28.0)
O2 Saturation: 38.2 %
Patient temperature: 98.6
pCO2, Ven: 72.5 mmHg (ref 44.0–60.0)
pH, Ven: 7.295 (ref 7.250–7.430)

## 2018-04-27 LAB — CBC WITH DIFFERENTIAL/PLATELET
Abs Immature Granulocytes: 0.08 10*3/uL — ABNORMAL HIGH (ref 0.00–0.07)
Basophils Absolute: 0 10*3/uL (ref 0.0–0.1)
Basophils Relative: 0 %
Eosinophils Absolute: 0 10*3/uL (ref 0.0–0.5)
Eosinophils Relative: 0 %
HCT: 42.2 % (ref 39.0–52.0)
Hemoglobin: 13 g/dL (ref 13.0–17.0)
Immature Granulocytes: 1 %
LYMPHS PCT: 13 %
Lymphs Abs: 0.9 10*3/uL (ref 0.7–4.0)
MCH: 28.5 pg (ref 26.0–34.0)
MCHC: 30.8 g/dL (ref 30.0–36.0)
MCV: 92.5 fL (ref 80.0–100.0)
Monocytes Absolute: 0.5 10*3/uL (ref 0.1–1.0)
Monocytes Relative: 8 %
Neutro Abs: 5.1 10*3/uL (ref 1.7–7.7)
Neutrophils Relative %: 78 %
Platelets: 152 10*3/uL (ref 150–400)
RBC: 4.56 MIL/uL (ref 4.22–5.81)
RDW: 17.2 % — ABNORMAL HIGH (ref 11.5–15.5)
WBC: 6.6 10*3/uL (ref 4.0–10.5)
nRBC: 0 % (ref 0.0–0.2)

## 2018-04-27 LAB — I-STAT TROPONIN, ED: TROPONIN I, POC: 0.04 ng/mL (ref 0.00–0.08)

## 2018-04-27 LAB — BRAIN NATRIURETIC PEPTIDE: B Natriuretic Peptide: 37.3 pg/mL (ref 0.0–100.0)

## 2018-04-27 LAB — I-STAT CG4 LACTIC ACID, ED
Lactic Acid, Venous: 2.01 mmol/L (ref 0.5–1.9)
Lactic Acid, Venous: 1.36 mmol/L (ref 0.5–1.9)

## 2018-04-27 MED ORDER — DEXAMETHASONE 2 MG PO TABS: 4.0000 mg | ORAL_TABLET | Freq: Two times a day (BID) | ORAL | Status: DC

## 2018-04-27 MED ORDER — CARVEDILOL 3.125 MG PO TABS
3.1250 mg | ORAL_TABLET | Freq: Two times a day (BID) | ORAL | Status: DC
  Administered 2018-04-27 – 2018-05-14 (×32): 3.125 mg via ORAL
  Filled 2018-04-27 (×35): qty 1

## 2018-04-27 MED ORDER — POLYETHYLENE GLYCOL 3350 17 G PO PACK
17.0000 g | PACK | Freq: Every day | ORAL | Status: DC
  Administered 2018-04-29 – 2018-05-15 (×12): 17 g via ORAL
  Filled 2018-04-27 (×16): qty 1

## 2018-04-27 MED ORDER — ENOXAPARIN SODIUM 40 MG/0.4ML ~~LOC~~ SOLN: 40.0000 mg | SUBCUTANEOUS | Status: DC

## 2018-04-27 MED ORDER — APIXABAN 5 MG PO TABS
5.0000 mg | ORAL_TABLET | Freq: Two times a day (BID) | ORAL | Status: DC
  Administered 2018-04-27 – 2018-05-15 (×35): 5 mg via ORAL
  Filled 2018-04-27 (×36): qty 1

## 2018-04-27 MED ORDER — ACETAMINOPHEN 325 MG PO TABS
650.0000 mg | ORAL_TABLET | Freq: Four times a day (QID) | ORAL | Status: DC | PRN
  Administered 2018-05-06: 650 mg via ORAL
  Filled 2018-04-27: qty 2

## 2018-04-27 MED ORDER — APIXABAN 5 MG PO TABS: 5.0000 mg | ORAL_TABLET | Freq: Two times a day (BID) | ORAL | Status: DC

## 2018-04-27 MED ORDER — IPRATROPIUM-ALBUTEROL 0.5-2.5 (3) MG/3ML IN SOLN
3.0000 mL | Freq: Once | RESPIRATORY_TRACT | Status: AC
  Administered 2018-04-27: 3 mL via RESPIRATORY_TRACT
  Filled 2018-04-27: qty 3

## 2018-04-27 MED ORDER — ORAL CARE MOUTH RINSE
15.0000 mL | Freq: Two times a day (BID) | OROMUCOSAL | Status: DC
  Administered 2018-04-27: 15 mL via OROMUCOSAL

## 2018-04-27 MED ORDER — SENNOSIDES-DOCUSATE SODIUM 8.6-50 MG PO TABS
1.0000 | ORAL_TABLET | Freq: Every day | ORAL | Status: DC
  Administered 2018-04-27 – 2018-05-14 (×14): 1 via ORAL
  Filled 2018-04-27 (×18): qty 1

## 2018-04-27 MED ORDER — SODIUM CHLORIDE 0.9 % IV BOLUS
500.0000 mL | Freq: Once | INTRAVENOUS | Status: AC
  Administered 2018-04-27: 500 mL via INTRAVENOUS

## 2018-04-27 MED ORDER — SODIUM CHLORIDE 0.9 % IV SOLN
INTRAVENOUS | Status: DC | PRN
  Administered 2018-04-27: 250 mL via INTRAVENOUS

## 2018-04-27 MED ORDER — IPRATROPIUM-ALBUTEROL 0.5-2.5 (3) MG/3ML IN SOLN
3.0000 mL | RESPIRATORY_TRACT | Status: DC | PRN
  Filled 2018-04-27: qty 3

## 2018-04-27 MED ORDER — ONDANSETRON HCL 4 MG PO TABS: 4.0000 mg | ORAL_TABLET | Freq: Four times a day (QID) | ORAL | Status: DC | PRN

## 2018-04-27 MED ORDER — ONDANSETRON HCL 4 MG/2ML IJ SOLN: 4.0000 mg | Freq: Four times a day (QID) | INTRAMUSCULAR | Status: DC | PRN

## 2018-04-27 MED ORDER — IPRATROPIUM-ALBUTEROL 0.5-2.5 (3) MG/3ML IN SOLN
3.0000 mL | Freq: Four times a day (QID) | RESPIRATORY_TRACT | Status: DC
  Administered 2018-04-27 – 2018-05-05 (×33): 3 mL via RESPIRATORY_TRACT
  Filled 2018-04-27 (×33): qty 3

## 2018-04-27 MED ORDER — FOLIC ACID 1 MG PO TABS: 1.0000 mg | ORAL_TABLET | Freq: Every day | ORAL | Status: DC

## 2018-04-27 MED ORDER — PIPERACILLIN-TAZOBACTAM 3.375 G IVPB 30 MIN: 3.3750 g | Freq: Four times a day (QID) | INTRAVENOUS | Status: DC

## 2018-04-27 MED ORDER — FUROSEMIDE 10 MG/ML IJ SOLN
40.0000 mg | Freq: Every day | INTRAMUSCULAR | Status: DC
  Administered 2018-04-27: 40 mg via INTRAVENOUS
  Filled 2018-04-27: qty 4

## 2018-04-27 MED ORDER — LOSARTAN POTASSIUM 50 MG PO TABS: 25.0000 mg | ORAL_TABLET | Freq: Every day | ORAL | Status: DC

## 2018-04-27 MED ORDER — ACETAMINOPHEN 650 MG RE SUPP: 650.0000 mg | Freq: Four times a day (QID) | RECTAL | Status: DC | PRN

## 2018-04-27 MED ORDER — PIPERACILLIN-TAZOBACTAM 3.375 G IVPB
3.3750 g | Freq: Three times a day (TID) | INTRAVENOUS | Status: DC
  Administered 2018-04-27 – 2018-05-05 (×23): 3.375 g via INTRAVENOUS
  Filled 2018-04-27 (×25): qty 50

## 2018-04-27 NOTE — ED Notes (Addendum)
EDP AT BEDSIDE UPDATED ON X-RAY

## 2018-04-27 NOTE — Progress Notes (Signed)
[facility] Progress Note   Bradley Maldonado [id number] January 29, 1951 67 y.o.  Bradley Maldonado is managed by Dr. Fanny Maldonado. Bradley Maldonado  Actively treated with chemotherapy/immunotherapy/hormonal therapy: no   Assessment: Plan:    Adenocarcinoma of right lung, stage 4 (HCC)  Hypoxia  Tachycardia   Adenocarcinoma of the right lung with metastatic disease to the brain: The patient is followed by Dr. Julien Maldonado and is pending start of chemotherapy.  [name] completed radiation to the brain under the care of Dr. Isidore Maldonado.  Hypoxia and tachycardia: The patient was noted to have a pulse of 121 and an oxygen saturation of 80% on 3 L via nasal cannula.  He was then placed on 6 L per nonrebreather mask with the patient only able to achieve an oxygen saturation of 90% with continued tachycardia at 114 bpm.  The patient was taken to the emergency room for evaluation and management.  Please see After Visit Summary for patient specific instructions.  Future Appointments  Date Time Provider [place]  05/02/2018  2:30 PM [facility] LAB 2 [facility] None  05/02/2018  3:00 PM Bradley Bussing R, NP [facility] None  05/03/2018 12:30 PM [facility] LAB 4 [facility] None  05/03/2018  1:30 PM [facility] INFUSION [facility] None  05/09/2018 11:00 AM Bradley Gibson, MD [facility] None    No orders of the defined types were placed in this encounter.      Subjective:   Patient ID:  Bradley Maldonado is a 67 y.o. (DOB 1950/10/18) male.  Chief Complaint: No chief complaint on file.   HPI Bradley Maldonado   is a 67 year old male who is followed by Dr. Fanny Maldonado. Bradley Maldonado for his history of a stage IV (T3, N1, M1a) non-small cell lung cancer, adenocarcinoma which was originally diagnosed in July 2018 when he presented with a right hilar lymphadenopathy and malignant right pleural effusion diagnosed in July 2018.  The patient was noted to have multiple metastatic brain lesions and  was treated with whole brain radiation under the direction of Dr. Isidore Maldonado.  Prior to that he was treated with Keytruda 200 mg IV every 3 weeks with the patient completing 17 cycles of therapy.  He was last treated on February 22, 2018 and discontinued this treatment secondary to disease progression.  He was next treated with systemic chemotherapy with carboplatin and Alimta which was first dosed on 04/11/2018.  The patient presented today for labs.  He was receiving oxygen at 3 L/min via nasal cannula when he presented to the clinic today.  According to his uncle who presents with him, the patient became acutely short of breath when presenting to the office today.  He was noted to have an oxygen saturation of 80% despite receiving oxygen via nasal cannula at 3 L/min.  He was noted to be tachycardic at 122 bpm.  He reports having bilateral lower extremity edema.  He was placed on oxygen at 6 L/min via a nonrebreather mask.  He was only able to increase his oxygen to 90%.  He was noted to continue having tachycardia at 114 bpm.  He was transported to the emergency room for evaluation and management.  The patient lives alone.  His uncle is concerned that he will be able to continue living independently.  The patient states that he wants everything done for him today.  He reports that he wants to continue trying to get better.  Medications: I have reviewed the patient's current medications.  Allergies: No Known Allergies  Past Medical History:  Diagnosis Date  . Adenocarcinoma of right lung, stage 4 ([place]) 01/24/2017  . Goals of care, counseling/discussion 01/24/2017  . Hypertension     Past Surgical History:  Procedure Laterality Date  . CHEST TUBE INSERTION Right 02/06/2017   Procedure: INSERTION PLEURAL DRAINAGE CATHETER;  Surgeon: Bradley Poot, MD;  Location: [facility];  Service: Thoracic;  Laterality: Right;  . EYE SURGERY Right    metal removed from eye  . Hip replacement    . IR THORACENTESIS ASP  PLEURAL SPACE W/IMG GUIDE  12/19/2016  . REMOVAL OF PLEURAL DRAINAGE CATHETER Right 03/21/2017   Procedure: REMOVAL OF RIGHT PLEURAL DRAINAGE CATHETER;  Surgeon: Bradley Poot, MD;  Location: [facility] OR;  Service: Thoracic;  Laterality: Right;    Family History  Problem Relation Age of Onset  . Diabetes Mother   . Lung cancer Father   . Cancer Father   . Cancer - Lung Father     Social History   Socioeconomic History  . Marital status: Divorced    Spouse name: Not on file  . Number of children: Not on file  . Years of education: Not on file  . Highest education level: Not on file  Occupational History  . Not on file  Social Needs  . Financial resource strain: Not on file  . Food insecurity:    Worry: Not on file    Inability: Not on file  . Transportation needs:    Medical: Yes    Non-medical: Yes  Tobacco Use  . Smoking status: Former Smoker    Last attempt to quit: 01/21/2018    Years since quitting: 0.2  . Smokeless tobacco: Never Used  Substance and Sexual Activity  . Alcohol use: Yes    Alcohol/week: 14.0 standard drinks    Types: 14 Cans of beer per week    Comment: occasionally  now since cancer diagnosis, he denies drinking alcohol in the last  6 days (03/07/18)  . Drug use: Yes    Types: Marijuana, Cocaine    Comment: using cocaine 1-2 times per week, he denies using  either drug "in a long time"   . Sexual activity: Not Currently  Lifestyle  . Physical activity:    Days per week: Not on file    Minutes per session: Not on file  . Stress: Not on file  Relationships  . Social connections:    Talks on phone: Not on file    Gets together: Not on file    Attends religious service: Not on file    Active member of club or organization: Not on file    Attends meetings of clubs or organizations: Not on file    Relationship status: Not on file  . Intimate partner violence:    Fear of current or ex partner: No    Emotionally abused: No    Physically abused: No      Forced sexual activity: No  Other Topics Concern  . Not on file  Social History Narrative   [facility] (12/19/16):   No pets currently. No mold exposure. No recent travel.    Past Medical History, Surgical history, Social history, and Family history were reviewed and updated as appropriate.   Please see review of systems for further details on the patient's review from today.   Review of Systems:  Review of Systems  Constitutional: Negative for chills, diaphoresis, fatigue and fever.  HENT: Negative for congestion, postnasal drip, rhinorrhea and sore  throat.   Respiratory: Positive for shortness of breath. Negative for cough and wheezing.   Cardiovascular: Positive for leg swelling. Negative for palpitations.  Neurological: Negative for headaches.    Objective:   Physical Exam:  There were no vitals taken for this visit. ECOG: 1  Physical Exam  Constitutional:  The patient is an adult male who appears older than his stated age and appears to be chronically ill.  He appears to be short of breath.  HENT:  Head: Normocephalic.  Scaling across the frontal scalp and an area of hyperpigmentation consistent with radiation field.  Cardiovascular: S1 normal and S2 normal. Tachycardia present.  Pulmonary/Chest: Tachypnea noted. No respiratory distress.  The patient has coarse wet breath sounds throughout all lung fields.  Musculoskeletal: He exhibits edema (1+ bilateral lower extremity edema.).  Neurological: Coordination (The patient is ambulating with the use of a wheelchair.) abnormal.  Skin: Skin is warm and dry.    Lab Review:     Component Value Date/Time   NA 142 04/27/2018 1305   NA 138 05/04/2017 1011   K 3.4 (L) 04/27/2018 1305   K 3.9 05/04/2017 1011   CL 100 04/27/2018 1305   CO2 32 04/27/2018 1305   CO2 24 05/04/2017 1011   GLUCOSE 135 (H) 04/27/2018 1305   GLUCOSE 105 05/04/2017 1011   BUN 7 (L) 04/27/2018 1305   BUN 18.4 05/04/2017 1011    CREATININE 0.63 04/27/2018 1305   CREATININE 0.82 04/04/2018 1208   CREATININE 1.0 05/04/2017 1011   CALCIUM 8.4 (L) 04/27/2018 1305   CALCIUM 9.3 05/04/2017 1011   PROT 5.7 (L) 04/04/2018 1208   PROT 7.7 05/04/2017 1011   ALBUMIN 2.8 (L) 04/04/2018 1208   ALBUMIN 3.2 (L) 05/04/2017 1011   AST 14 (L) 04/04/2018 1208   AST 23 05/04/2017 1011   ALT 23 04/04/2018 1208   ALT 19 05/04/2017 1011   ALKPHOS 84 04/04/2018 1208   ALKPHOS 91 05/04/2017 1011   BILITOT 0.4 04/04/2018 1208   BILITOT 0.39 05/04/2017 1011   GFRNONAA >60 04/27/2018 1305   GFRNONAA >60 04/04/2018 1208   GFRAA >60 04/27/2018 1305   GFRAA >60 04/04/2018 1208       Component Value Date/Time   WBC 6.6 04/27/2018 1305   RBC 4.56 04/27/2018 1305   HGB 13.0 04/27/2018 1305   HGB 14.9 04/04/2018 1208   HGB 15.3 05/04/2017 1011   HCT 42.2 04/27/2018 1305   HCT 47.7 05/04/2017 1011   PLT 152 04/27/2018 1305   PLT 113 (L) 04/04/2018 1208   PLT 210 05/04/2017 1011   MCV 92.5 04/27/2018 1305   MCV 86.9 05/04/2017 1011   MCH 28.5 04/27/2018 1305   MCHC 30.8 04/27/2018 1305   RDW 17.2 (H) 04/27/2018 1305   RDW 15.1 (H) 05/04/2017 1011   LYMPHSABS 0.9 04/27/2018 1305   LYMPHSABS 2.0 05/04/2017 1011   MONOABS 0.5 04/27/2018 1305   MONOABS 0.6 05/04/2017 1011   EOSABS 0.0 04/27/2018 1305   EOSABS 0.2 05/04/2017 1011   BASOSABS 0.0 04/27/2018 1305   BASOSABS 0.0 05/04/2017 1011   -------------------------------  Imaging from last 24 hours (if applicable):  Radiology interpretation: Dg Chest Port 1 View  Result Date: 04/27/2018 CLINICAL DATA:  Shortness of breath.  Right-sided lung cancer. EXAM: PORTABLE CHEST 1 VIEW COMPARISON:  03/02/2018 FINDINGS: Metastatic nodules remain evident throughout the left lung without gross progression. Left lung is overall well aerated in there is no left-sided effusion. Opacification of the right hemithorax is  slightly progressive, with pleural and parenchymal opacity related to  advanced carcinoma. No acute bone finding. IMPRESSION: Slight progressive opacity of the right hemithorax with pleural and parenchymal density secondary to lung carcinoma. There is only minimal aeration in the right upper lobe. Metastatic nodules in the left lung appear similar to the previous study. Left lung is largely well aerated. Electronically Signed   By: Nelson Chimes M.D.   On: 04/27/2018 12:35

## 2018-04-27 NOTE — H&P (Addendum)
History and Physical    ABDOULAYE DRUM JKD:[id number] DOB: [birth year] DOA: 04/27/2018  PCP: [name], NP-C   Patient coming from: Home   Chief Complaint: Dyspnea.   HPI: Bradley Maldonado is a 67 y.o. male with medical history significant of stage IV non-small cell lung cancer, with brain metastasis treated with palliative chemotherapy and brain radiation.  He also has systolic heart failure ejection fraction 25 to 30%, hypertension, and pulmonary embolism.  For the last 3 days patient has been experiencing worsening dyspnea, severe in intensity, triggered by minimal effort, associated with chest congestion, orthopnea, lower extremity edema and productive cough, no fevers or chills.  There is no improving factors, it is worse with exertion.  Patient has been bedridden due to severe dyspnea.  Today he went to the oncology clinic for blood work, he was noted severely dyspneic, his oxygen saturation was in the low 80s.  He was brought to the hospital for further evaluation.  At home he is on supplemental oxygen 2 L/min, he has extensive malignancy on his right lung with significant lung volume loss, consolidation throughout much of his right middle lobe and right lower lobe.  In October 2019 he was diagnosed with lobar and segmental pulmonary embolism, he has been on apixaban for anticoagulation.   ED Course: Patient was found severely hypoxic, he was placed on a nonrebreather and received bronchodilator therapy.  He was referred for admission and further evaluation..  Review of Systems:  1. General: No fevers, no chills, no weight gain or weight loss 2. ENT: No runny nose or sore throat, no hearing disturbances 3. Pulmonary: positive severe dyspnea, with cough, and wheezing, but no hemoptysis 4. Cardiovascular: No angina, claudication, lower extremity edema, pnd or orthopnea 5. Gastrointestinal: No nausea or vomiting, no diarrhea or constipation 6. Hematology: No easy bruisability or  frequent infections 7. Urology: No dysuria, hematuria or increased urinary frequency 8. Dermatology: No rashes. 9. Neurology: No seizures or paresthesias 10. Musculoskeletal: No joint pain or deformities  Past Medical History:  Diagnosis Date  . Adenocarcinoma of right lung, stage 4 ([place]) 01/24/2017  . Goals of care, counseling/discussion 01/24/2017  . Hypertension     Past Surgical History:  Procedure Laterality Date  . CHEST TUBE INSERTION Right 02/06/2017   Procedure: INSERTION PLEURAL DRAINAGE CATHETER;  Surgeon: Ivin Poot, MD;  Location: [facility];  Service: Thoracic;  Laterality: Right;  . EYE SURGERY Right    metal removed from eye  . Hip replacement    . IR THORACENTESIS ASP PLEURAL SPACE W/IMG GUIDE  12/19/2016  . REMOVAL OF PLEURAL DRAINAGE CATHETER Right 03/21/2017   Procedure: REMOVAL OF RIGHT PLEURAL DRAINAGE CATHETER;  Surgeon: Ivin Poot, MD;  Location: [facility];  Service: Thoracic;  Laterality: Right;     reports that he quit smoking about 3 months ago. He has never used smokeless tobacco. He reports that he drinks about 14.0 standard drinks of alcohol per week. He reports that he has current or past drug history. Drugs: Marijuana and Cocaine.  No Known Allergies  Family History  Problem Relation Age of Onset  . Diabetes Mother   . Lung cancer Father   . Cancer Father   . Cancer - Lung Father      Prior to Admission medications   Medication Sig Start Date End Date Taking? Authorizing Provider  carvedilol (COREG) 3.125 MG tablet Take 1 tablet (3.125 mg total) by mouth 2 (two) times daily with a meal. 03/05/18  Lavina Hamman, MD  dexamethasone (DECADRON) 4 MG tablet Take 1 tab twice daily. On 11-14 reduce dose to one tab daily. On 11-21 reduce dose to 1/2 tab daily. Last dose on 12-1. 04/02/18   Eppie Gibson, MD  dexamethasone (DECADRON) 4 MG tablet 4 mg p.o. twice daily the day before, day of and day after chemotherapy every 3 weeks. 04/04/18   Curt Bears, MD  diazepam (VALIUM) 5 MG tablet Take 1 tablet PO 45 minutes before MRI, and 45 minutes before mask fabrication, and 45 minutes before brain radiation therapy. Patient not taking: Reported on 04/04/2018 03/07/18   Eppie Gibson, MD  ELIQUIS STARTER PACK [facility] STARTER PACK) 5 MG TABS Take as directed on package: start with two-5mg  tablets twice daily for 7 days. On day 8, switch to one-5mg  tablet twice daily. 03/05/18   Lavina Hamman, MD  fluconazole (DIFLUCAN) 100 MG tablet Take 2 tablets today, then 1 tablet daily x 20 more days. 03/07/18   Eppie Gibson, MD  folic acid (FOLVITE) 1 MG tablet Take 1 tablet (1 mg total) by mouth daily. 04/04/18   Curt Bears, MD  LORazepam (ATIVAN) 1 MG tablet Take 1 tablet by mouth 20-30 min before radiation therapy. 03/21/18   Eppie Gibson, MD  losartan (COZAAR) 25 MG tablet Take 1 tablet (25 mg total) by mouth daily. 03/06/18   Lavina Hamman, MD  polyethylene glycol [facility] / Floria Raveling) packet Take 17 g by mouth daily. 03/06/18   Lavina Hamman, MD  prochlorperazine (COMPAZINE) 10 MG tablet Take 1 tablet (10 mg total) by mouth every 6 (six) hours as needed for nausea or vomiting. 04/04/18   Curt Bears, MD  senna-docusate (SENOKOT-S) 8.6-50 MG tablet Take 1 tablet by mouth at bedtime. 03/05/18   Lavina Hamman, MD    Physical Exam: Vitals:   04/27/18 1248 04/27/18 1300 04/27/18 1334 04/27/18 1432  BP: 114/78 124/73 124/73 117/76  Pulse: (!) 109 (!) 103 (!) 101 99  Resp: (!) 23 (!) 22 19 15   Temp:      TempSrc:      SpO2: 98% 99% 99% 100%  Weight:      Height:        Vitals:   04/27/18 1248 04/27/18 1300 04/27/18 1334 04/27/18 1432  BP: 114/78 124/73 124/73 117/76  Pulse: (!) 109 (!) 103 (!) 101 99  Resp: (!) 23 (!) 22 19 15   Temp:      TempSrc:      SpO2: 98% 99% 99% 100%  Weight:      Height:       General: deconditioned and ill looking appearing, positive dyspnea.  Neurology: Awake and alert, non focal Head and  Neck. Head normocephalic. Neck supple with no adenopathy or thyromegaly.   E ENT: positive pallor, no icterus, oral mucosa moist Cardiovascular: No JVD. S1-S2 present, rhythmic, no gallops, rubs, or murmurs. +++ pitting bilateral lower extremity edema. Pulmonary: decrased breath sounds bilaterally, very poor air movement, no wheezing, diffuse bilateral rhonchi and rales. Gastrointestinal. Abdomen distended, no organomegaly, non tender, no rebound or guarding Skin. No rashes Musculoskeletal: no joint deformities    Labs on Admission: I have personally reviewed following labs and imaging studies  CBC: Recent Labs  Lab 04/27/18 1305  WBC 6.6  NEUTROABS 5.1  HGB 13.0  HCT 42.2  MCV 92.5  PLT 938   Basic Metabolic Panel: Recent Labs  Lab 04/27/18 1305  NA 142  K 3.4*  CL 100  CO2 32  GLUCOSE 135*  BUN 7*  CREATININE 0.63  CALCIUM 8.4*   GFR: Estimated Creatinine Clearance: 86.1 mL/min (by C-G formula based on SCr of 0.63 mg/dL). Liver Function Tests: No results for input(s): AST, ALT, ALKPHOS, BILITOT, PROT, ALBUMIN in the last 168 hours. No results for input(s): LIPASE, AMYLASE in the last 168 hours. No results for input(s): AMMONIA in the last 168 hours. Coagulation Profile: No results for input(s): INR, PROTIME in the last 168 hours. Cardiac Enzymes: No results for input(s): CKTOTAL, CKMB, CKMBINDEX, TROPONINI in the last 168 hours. BNP (last 3 results) No results for input(s): PROBNP in the last 8760 hours. HbA1C: No results for input(s): HGBA1C in the last 72 hours. CBG: No results for input(s): GLUCAP in the last 168 hours. Lipid Profile: No results for input(s): CHOL, HDL, LDLCALC, TRIG, CHOLHDL, LDLDIRECT in the last 72 hours. Thyroid Function Tests: No results for input(s): TSH, T4TOTAL, FREET4, T3FREE, THYROIDAB in the last 72 hours. Anemia Panel: No results for input(s): VITAMINB12, FOLATE, FERRITIN, TIBC, IRON, RETICCTPCT in the last 72 hours. Urine  analysis: No results found for: COLORURINE, APPEARANCEUR, LABSPEC, [place], GLUCOSEU, HGBUR, BILIRUBINUR, KETONESUR, PROTEINUR, UROBILINOGEN, NITRITE, LEUKOCYTESUR  Radiological Exams on Admission: Dg Chest Port 1 View  Result Date: 04/27/2018 CLINICAL DATA:  Shortness of breath.  Right-sided lung cancer. EXAM: PORTABLE CHEST 1 VIEW COMPARISON:  03/02/2018 FINDINGS: Metastatic nodules remain evident throughout the left lung without gross progression. Left lung is overall well aerated in there is no left-sided effusion. Opacification of the right hemithorax is slightly progressive, with pleural and parenchymal opacity related to advanced carcinoma. No acute bone finding. IMPRESSION: Slight progressive opacity of the right hemithorax with pleural and parenchymal density secondary to lung carcinoma. There is only minimal aeration in the right upper lobe. Metastatic nodules in the left lung appear similar to the previous study. Left lung is largely well aerated. Electronically Signed   By: Nelson Chimes M.D.   On: 04/27/2018 12:35    EKG: Independently reviewed.  EKG sinus tachycardia, normal axis normal intervals.  Assessment/Plan Active Problems:   Respiratory failure ([place])  67 year old male with stage IV non-small cell lung cancer with brain metastasis and extensive malignancy burden on his right hemothorax, significant lung volume loss on the right side and recently diagnosed pulmonary embolism, presents with worsening dyspnea for last 3 days, severe in intensity, to the point where he has been bedridden.  It has been associated with congestion and cough.  On his initial physical examination he was severely hypoxic on room air, his blood pressure is 124/73, heart rate 101, respiratory rate 23, oxygen saturation 99% on 100% FiO2 nonrebreather.  Dry mucous membranes, significant rhonchi and rales bilaterally, heart S1-S2 present rhythmic, abdomen distended, +3+ pitting edema bilaterally.  Sodium 142,  potassium 3.4, chloride 100, bicarb 32, glucose 135, BUN 7, creatinine 0.63, BNP 37, white count 6.6, hemoglobin 13.0, hematocrit 42.2, platelets 152, chest radiograph with significant opacification of the right lower and right middle lobe, worsening infiltrate in the right upper lobe, left hemithorax with no significant infiltrates.   Patient will be admitted to the stepdown unit with a working diagnosis of acute on chronic hypoxic respiratory failure, possible postobstructive pneumonia, rule out volume overload.  1.  Acute on chronic hypoxic respiratory failure.  Patient will be admitted to the stepdown unit, will continue oximetry monitoring and supplemental oxygen per high flow nasal cannula, patient may need noninvasive mechanical ventilation if increases work of breathing.  His venous  pH is 7.29.  Will prescribe broad spectrum IV antibiotic therapy with Zosyn, follow-up on cultures, cell count and temperature curve.  Continue bronchodilator therapy with DuoNeb.  2.  Acute on chronic systolic heart failure.  Nonischemic cardiomyopathy, ejection fraction 25 to 30%, will target negative fluid balance, with intravenous furosemide.  Continue telemetry monitoring and blood pressure control.  Continue carvedilol and losartan.  3.  Stage IV non-small cell lung cancer.  Advanced cancer with brain metastasis currently on immunotherapy.  Certainly palliative care/ hospice is an option, old records personally reviewed and this topic was addressed in the oncology clinic, and patient decided to continue palliative therapy.  CODE STATUS currently is full code.  Continue Decadron.  4.  Pulmonary embolism.  Continue anticoagulation with apixaban.  5.  Anxiety.  Continue as needed lorazepam.   Patient is critically ill with severe hypoxic respiratory failure,  will continue medical therapy and supplemental oxygen to prevent imminent duration.  Critical care time 60 minutes  DVT prophylaxis: apixaban  Code  Status:  full  Family Communication: no family at the bedside   Disposition Plan: step down unit   Consults called: none   Admission status: Inpatient.     Mauricio Gerome Apley MD [facility] Pager [phone]  If 7PM-7AM, please contact night-coverage [email or web address] Password [email or web address]  04/27/2018, 2:40 PM

## 2018-04-27 NOTE — ED Notes (Signed)
X-ray AT BEDSIDE

## 2018-04-27 NOTE — ED Notes (Signed)
WAKIYA S NT REQUESTED TO DRAW ADDITIONAL BLOOD CULTURE.

## 2018-04-27 NOTE — Progress Notes (Signed)
[place] for eliquis Indication: hx pulmonary embolus  No Known Allergies  Patient Measurements: Height: 5\' 11"  (180.3 cm) Weight: 152 lb (68.9 kg) IBW/kg (Calculated) : 75.3 Heparin Dosing Weight:   Vital Signs: Temp: 97.8 F (36.6 C) (12/06 1212) Temp Source: Axillary (12/06 1212) BP: 113/68 (12/06 1530) Pulse Rate: 101 (12/06 1530)  Labs: Recent Labs    04/27/18 1305  HGB 13.0  HCT 42.2  PLT 152  CREATININE 0.63    Estimated Creatinine Clearance: 86.1 mL/min (by C-G formula based on SCr of 0.63 mg/dL).   Medications:  - on Eliquis PTA.  Patient stated that he thought his MD instructed him to take the Eliquis once daily.  However, he was prescribed Eliquis dose pack (10mg  bid for 7 days, then 5 mg bid) on 03/05/18 at discharge for the PE.  Patient probably misunderstood the instructions and has been taking it once a day instead of twice daily.  Assessment: Patient is a 67 y.o M with hx HF, stage IV NSCLC, and PE (Oct 2019) on Eliquis PTA, presented to the ED on 12/6 with c/o SOB.  To resume Eliquis on admission and start zosyn for suspected PNA.    Plan:  - Eliquis 5 mg BID (informed Dr. Cathlean Sauer of plan to change eliquis dose to bid inpatient) - zosyn 3.375 gm IV q8h (infuse over 4 hrs).  With good renal function, pharmacy will sign off for zosyn.  Re-consult [place] if need further assistance.  Izsak Meir P 04/27/2018,3:58 PM

## 2018-04-27 NOTE — ED Notes (Signed)
Family at bedside. 

## 2018-04-27 NOTE — ED Provider Notes (Signed)
[facility] DEPT Provider Note   CSN: [id number] Arrival date & time: 04/27/18  1140     History   Chief Complaint Chief Complaint  Patient presents with  . Tachycardia  . LOW 02 SATS 80 % 3L [place]  . Lung Cancer  . Shortness of Breath    HPI Bradley Maldonado is a 67 y.o. male.  [name] brought in by his uncle for evaluation of increased shortness of breath.  He has a history of stage IV lung cancer with mets to brain.  [name] recently completed radiation to his head.  [name] followed in the oncology center by Dr. Julien Nordmann.  He has been short of breath increasingly over 2 weeks.  [name] normally doing okay on 3 L nasal cannula for about the last month or so.  Today was noted in the clinic to have an elevated heart rate of 120s and sats of 80%.  They have increased his oxygen and brought him over on a mask.  Patient denies any fevers or chills no chest pain no abdominal pain.  He has noticed increased in his lower extremity edema.  [name] not actively smoking.  He has mostly a dry cough but sometimes he will bring up some beige sputum.  He has been noticing wheezing.  [name] on anticoagulation  The history is provided by the patient and a relative.  Shortness of Breath  This is a new problem. The problem has been gradually worsening. Associated symptoms include cough, sputum production, wheezing, PND, orthopnea and leg swelling. Pertinent negatives include no fever, no headaches, no sore throat, no neck pain, no hemoptysis, no chest pain, no syncope, no vomiting, no abdominal pain, no rash and no leg pain. It is unknown what precipitated the problem. He has tried nothing for the symptoms. The treatment provided no relief. Associated medical issues include heart failure.    Past Medical History:  Diagnosis Date  . Adenocarcinoma of right lung, stage 4 ([place]) 01/24/2017  . Goals of care, counseling/discussion 01/24/2017  . Hypertension     Patient Active Problem List   Diagnosis Date Noted  . Encounter for antineoplastic chemotherapy 04/04/2018  . Brain metastases ([place]) 03/14/2018  . Acute combined systolic and diastolic heart failure ([place]) 03/05/2018  . Recurrent right pleural effusion 03/01/2018  . Encounter for antineoplastic immunotherapy 02/15/2017  . Adenocarcinoma of right lung, stage 4 ([place]) 01/24/2017  . Goals of care, counseling/discussion 01/24/2017  . Status post thoracentesis   . Chronic obstructive pulmonary disease ([place])   . Lung cancer ([place]) 12/20/2016  . Pleural effusion, malignant 12/17/2016  . Hypertension 12/17/2016  . History of right hip replacement 12/17/2016    Past Surgical History:  Procedure Laterality Date  . CHEST TUBE INSERTION Right 02/06/2017   Procedure: INSERTION PLEURAL DRAINAGE CATHETER;  Surgeon: Ivin Poot, MD;  Location: [facility];  Service: Thoracic;  Laterality: Right;  . EYE SURGERY Right    metal removed from eye  . Hip replacement    . IR THORACENTESIS ASP PLEURAL SPACE W/IMG GUIDE  12/19/2016  . REMOVAL OF PLEURAL DRAINAGE CATHETER Right 03/21/2017   Procedure: REMOVAL OF RIGHT PLEURAL DRAINAGE CATHETER;  Surgeon: Ivin Poot, MD;  Location: [facility];  Service: Thoracic;  Laterality: Right;        Home Medications    Prior to Admission medications   Medication Sig Start Date End Date Taking? Authorizing Provider  carvedilol (COREG) 3.125 MG tablet Take 1  tablet (3.125 mg total) by mouth 2 (two) times daily with a meal. 03/05/18   Lavina Hamman, MD  dexamethasone (DECADRON) 4 MG tablet Take 1 tab twice daily. On 11-14 reduce dose to one tab daily. On 11-21 reduce dose to 1/2 tab daily. Last dose on 12-1. 04/02/18   Eppie Gibson, MD  dexamethasone (DECADRON) 4 MG tablet 4 mg p.o. twice daily the day before, day of and day after chemotherapy every 3 weeks. 04/04/18   Curt Bears, MD  diazepam (VALIUM) 5 MG tablet Take 1 tablet PO 45 minutes before MRI, and 45 minutes before mask fabrication,  and 45 minutes before brain radiation therapy. Patient not taking: Reported on 04/04/2018 03/07/18   Eppie Gibson, MD  ELIQUIS STARTER PACK [facility] STARTER PACK) 5 MG TABS Take as directed on package: start with two-5mg  tablets twice daily for 7 days. On day 8, switch to one-5mg  tablet twice daily. 03/05/18   Lavina Hamman, MD  fluconazole (DIFLUCAN) 100 MG tablet Take 2 tablets today, then 1 tablet daily x 20 more days. 03/07/18   Eppie Gibson, MD  folic acid (FOLVITE) 1 MG tablet Take 1 tablet (1 mg total) by mouth daily. 04/04/18   Curt Bears, MD  LORazepam (ATIVAN) 1 MG tablet Take 1 tablet by mouth 20-30 min before radiation therapy. 03/21/18   Eppie Gibson, MD  losartan (COZAAR) 25 MG tablet Take 1 tablet (25 mg total) by mouth daily. 03/06/18   Lavina Hamman, MD  polyethylene glycol [facility] / Floria Raveling) packet Take 17 g by mouth daily. 03/06/18   Lavina Hamman, MD  prochlorperazine (COMPAZINE) 10 MG tablet Take 1 tablet (10 mg total) by mouth every 6 (six) hours as needed for nausea or vomiting. 04/04/18   Curt Bears, MD  senna-docusate (SENOKOT-S) 8.6-50 MG tablet Take 1 tablet by mouth at bedtime. 03/05/18   Lavina Hamman, MD    Family History Family History  Problem Relation Age of Onset  . Diabetes Mother   . Lung cancer Father   . Cancer Father   . Cancer - Lung Father     Social History Social History   Tobacco Use  . Smoking status: Former Smoker    Last attempt to quit: 01/21/2018    Years since quitting: 0.2  . Smokeless tobacco: Never Used  Substance Use Topics  . Alcohol use: Yes    Alcohol/week: 14.0 standard drinks    Types: 14 Cans of beer per week    Comment: occasionally  now since cancer diagnosis, he denies drinking alcohol in the last  6 days (03/07/18)  . Drug use: Yes    Types: Marijuana, Cocaine    Comment: using cocaine 1-2 times per week, he denies using  either drug "in a long time"      Allergies   Patient has no known  allergies.   Review of Systems Review of Systems  Constitutional: Negative for fever.  HENT: Negative for sore throat.   Eyes: Negative for pain.  Respiratory: Positive for cough, sputum production, shortness of breath and wheezing. Negative for hemoptysis.   Cardiovascular: Positive for orthopnea, leg swelling and PND. Negative for chest pain and syncope.  Gastrointestinal: Negative for abdominal pain and vomiting.  Genitourinary: Negative for dysuria.  Musculoskeletal: Negative for neck pain.  Skin: Negative for rash.  Neurological: Negative for headaches.     Physical Exam Updated Vital Signs BP 122/89   Pulse 100   Temp 97.8 F (36.6 C) (Axillary)  Resp (!) 23   Ht 5\' 11"  (1.803 m)   Wt 68.9 kg   SpO2 100%   BMI 21.20 kg/m   Physical Exam  Constitutional: He appears well-developed and well-nourished.  HENT:  Head: Normocephalic and atraumatic.  Eyes: Conjunctivae are normal.  Neck: Neck supple.  Cardiovascular: Regular rhythm. Tachycardia present.  No murmur heard. Pulmonary/Chest: Accessory muscle usage present. No stridor. Tachypnea noted. No respiratory distress. He has wheezes. He has rhonchi.  Abdominal: Soft. There is no tenderness.  Musculoskeletal: Normal range of motion.       Right lower leg: He exhibits edema.       Left lower leg: He exhibits edema.  Neurological: [name] alert.  Skin: Skin is warm and dry. Capillary refill takes less than 2 seconds.  Psychiatric: He has a normal mood and affect.  Nursing note and vitals reviewed.    ED Treatments / Results  Labs (all labs ordered are listed, but only abnormal results are displayed) Labs Reviewed  BASIC METABOLIC PANEL - Abnormal; Notable for the following components:      Result Value   Potassium 3.4 (*)    Glucose, Bld 135 (*)    BUN 7 (*)    Calcium 8.4 (*)    All other components within normal limits  CBC WITH DIFFERENTIAL/PLATELET - Abnormal; Notable for the following components:    RDW 17.2 (*)    Abs Immature Granulocytes 0.08 (*)    All other components within normal limits  BLOOD GAS, VENOUS - Abnormal; Notable for the following components:   pCO2, Ven 72.5 (*)    Bicarbonate 34.2 (*)    Acid-Base Excess 5.6 (*)    All other components within normal limits  I-STAT CG4 LACTIC ACID, ED - Abnormal; Notable for the following components:   Lactic Acid, Venous 2.01 (*)    All other components within normal limits  CULTURE, BLOOD (ROUTINE X 2)  CULTURE, BLOOD (ROUTINE X 2)  BRAIN NATRIURETIC PEPTIDE  I-STAT TROPONIN, ED  I-STAT CG4 LACTIC ACID, ED    EKG EKG Interpretation  Date/Time:  Friday April 27 2018 13:20:08 EST Ventricular Rate:  99 PR Interval:    QRS Duration: 85 QT Interval:  348 QTC Calculation: 447 R Axis:   56 Text Interpretation:  Sinus rhythm Left atrial enlargement similar to prior 10/19 Confirmed by Aletta Edouard [phone]) on 04/27/2018 1:24:50 PM   Radiology Dg Chest Port 1 View  Result Date: 04/27/2018 CLINICAL DATA:  Shortness of breath.  Right-sided lung cancer. EXAM: PORTABLE CHEST 1 VIEW COMPARISON:  03/02/2018 FINDINGS: Metastatic nodules remain evident throughout the left lung without gross progression. Left lung is overall well aerated in there is no left-sided effusion. Opacification of the right hemithorax is slightly progressive, with pleural and parenchymal opacity related to advanced carcinoma. No acute bone finding. IMPRESSION: Slight progressive opacity of the right hemithorax with pleural and parenchymal density secondary to lung carcinoma. There is only minimal aeration in the right upper lobe. Metastatic nodules in the left lung appear similar to the previous study. Left lung is largely well aerated. Electronically Signed   By: Nelson Chimes M.D.   On: 04/27/2018 12:35    Procedures Procedures (including critical care time)  Medications Ordered in ED Medications  ipratropium-albuterol (DUONEB) 0.5-2.5 (3) MG/3ML  nebulizer solution 3 mL (has no administration in time range)     Initial Impression / Assessment and Plan / ED Course  I have reviewed the triage vital signs and the  nursing notes.  Pertinent labs & imaging results that were available during my care of the patient were reviewed by me and considered in my medical decision making (see chart for details).  Clinical Course as of Apr 27 1528  Fri Apr 27, 2018  1250 Patient received a neb with some improvement in his tachypnea.  We try to get him onto a Ventimask but his sats were still in the mid 80s so have placed him back on nonrebreather.  The chest x-ray they read as slightly progressive opacity of the right hemithorax secondary to effusion and cancer.   [MB]  1438 Discussed with Dr. Cathlean Sauer from the hospitalist service will evaluate the patient for admission.  We discussed antibiotics and further CT imaging and he would like to look at the patient first before he makes a decision.   [MB]    Clinical Course User Index [MB] Hayden Rasmussen, MD   Final Clinical Impressions(s) / ED Diagnoses   Final diagnoses:  Shortness of breath  Hypoxia  Malignant neoplasm of right lung, unspecified part of lung [facility])    ED Discharge Orders    None       Hayden Rasmussen, MD 04/27/18 1530

## 2018-04-27 NOTE — ED Notes (Signed)
Bed: WA21 Expected date:  Expected time:  Means of arrival:  Comments: Coming from cancer center[facility]

## 2018-04-27 NOTE — Progress Notes (Signed)
Late entry:   Patient has been taking, apixaban only once daily at home. Will resume full dosing bid. And will repeat echocardiogram to rule out worsening RV function.

## 2018-04-27 NOTE — ED Notes (Signed)
BLOOD CULTURE X 1 OBTAINED LEFT FOREARM

## 2018-04-27 NOTE — ED Notes (Signed)
ED TO INPATIENT HANDOFF REPORT  Name/Age/Gender Bradley Maldonado 67 y.o. male  Code Status Code Status History    Date Active Date Inactive Code Status Order ID Comments User Context   03/01/2018 1559 03/06/2018 1510 Full Code [id number]  Bradley Border, MD Inpatient   12/17/2016 1707 12/21/2016 2036 Full Code [id number]  Bradley Leff, MD ED      Home/SNF/Other Home  Chief Complaint hypoxia tachycardia  Level of Care/Admitting Diagnosis ED Disposition    ED Disposition Condition [facility] Area: [facility] [[id number]]  Level of Care: Stepdown [14]  Admit to SDU based on following criteria: Other see comments  Comments: respiratory  Diagnosis: Respiratory failure [facility]) [[id number]]  Admitting Physician: Bradley Maldonado [[id number]]  Attending Physician: Bradley Maldonado [[id number]]  Estimated length of stay: 3 - 4 days  Certification:: I certify this patient will need inpatient services for at least 2 midnights  PT Class (Do Not Modify): Inpatient [101]  PT Acc Code (Do Not Modify): Private [1]       Medical History Past Medical History:  Diagnosis Date  . Adenocarcinoma of right lung, stage 4 ([place]) 01/24/2017  . Goals of care, counseling/discussion 01/24/2017  . Hypertension     Allergies No Known Allergies  IV Location/Drains/Wounds Patient Lines/Drains/Airways Status   Active Line/Drains/Airways    Name:   Placement date:   Placement time:   Site:   Days:   Peripheral IV 04/27/18 Left Forearm   04/27/18    1304    Forearm   less than 1          Labs/Imaging Results for orders placed or performed during the hospital encounter of 04/27/18 (from the past 48 hour(s))  Blood gas, venous     Status: Abnormal   Collection Time: 04/27/18 12:40 PM  Result Value Ref Range   pH, Ven 7.295 7.250 - 7.430   pCO2, Ven 72.5 (HH) 44.0 - 60.0 mmHg    Comment: CRITICAL RESULT CALLED TO, READ BACK BY AND VERIFIED WITH: M.BUTLER, MD AT  1245 BY M.JESTER, RRT, RCP ON 04/27/2018    pO2, Ven BELOW REPORTABLE RANGE 32.0 - 45.0 mmHg    Comment: CRITICAL RESULT CALLED TO, READ BACK BY AND VERIFIED WITH: M.BUTLER, MD AT 1245 BY M.JESTER, RRT, RCP ON 04/27/2018    Bicarbonate 34.2 (H) 20.0 - 28.0 mmol/L   Acid-Base Excess 5.6 (H) 0.0 - 2.0 mmol/L   O2 Saturation 38.2 %   Patient temperature 98.6    Collection site VENOUS    Drawn by DRAWN BY RN    Sample type VENOUS     Comment: Performed at [facility], [place] [address], [place], [place] [address]  I-stat troponin, ED     Status: None   Collection Time: 04/27/18 12:55 PM  Result Value Ref Range   Troponin i, poc 0.04 0.00 - 0.08 ng/mL   Comment 3            Comment: Due to the release kinetics of cTnI, a negative result within the first hours of the onset of symptoms does not rule out myocardial infarction with certainty. If myocardial infarction is still suspected, repeat the test at appropriate intervals.   I-Stat CG4 Lactic Acid, ED     Status: Abnormal   Collection Time: 04/27/18 12:57 PM  Result Value Ref Range   Lactic Acid, Venous 2.01 (HH) 0.5 - 1.9 mmol/L   Comment NOTIFIED PHYSICIAN   Basic  metabolic panel     Status: Abnormal   Collection Time: 04/27/18  1:05 PM  Result Value Ref Range   Sodium 142 135 - 145 mmol/L   Potassium 3.4 (L) 3.5 - 5.1 mmol/L   Chloride 100 98 - 111 mmol/L   CO2 32 22 - 32 mmol/L   Glucose, Bld 135 (H) 70 - 99 mg/dL   BUN 7 (L) 8 - 23 mg/dL   Creatinine, Ser 0.63 0.61 - 1.24 mg/dL   Calcium 8.4 (L) 8.9 - 10.3 mg/dL   GFR calc non Af Amer >60 >60 mL/min   GFR calc Af Amer >60 >60 mL/min   Anion gap 10 5 - 15    Comment: Performed at [facility], [place] [address] [place], [place] [address]  CBC with Differential     Status: Abnormal   Collection Time: 04/27/18  1:05 PM  Result Value Ref Range   WBC 6.6 4.0 - 10.5 K/uL   RBC 4.56 4.22 - 5.81 MIL/uL   Hemoglobin 13.0 13.0 - 17.0 g/dL    HCT 42.2 39.0 - 52.0 %   MCV 92.5 80.0 - 100.0 fL   MCH 28.5 26.0 - 34.0 pg   MCHC 30.8 30.0 - 36.0 g/dL   RDW 17.2 (H) 11.5 - 15.5 %   Platelets 152 150 - 400 K/uL   nRBC 0.0 0.0 - 0.2 %   Neutrophils Relative % 78 %   Neutro Abs 5.1 1.7 - 7.7 K/uL   Lymphocytes Relative 13 %   Lymphs Abs 0.9 0.7 - 4.0 K/uL   Monocytes Relative 8 %   Monocytes Absolute 0.5 0.1 - 1.0 K/uL   Eosinophils Relative 0 %   Eosinophils Absolute 0.0 0.0 - 0.5 K/uL   Basophils Relative 0 %   Basophils Absolute 0.0 0.0 - 0.1 K/uL   Immature Granulocytes 1 %   Abs Immature Granulocytes 0.08 (H) 0.00 - 0.07 K/uL    Comment: Performed at [facility], [place] [address], [place], [place] [address]  Brain natriuretic peptide     Status: None   Collection Time: 04/27/18  1:05 PM  Result Value Ref Range   B Natriuretic Peptide 37.3 0.0 - 100.0 pg/mL    Comment: Performed at [facility], [place] [address] [place], [place] [address]   Dg Chest Port 1 View  Result Date: 04/27/2018 CLINICAL DATA:  Shortness of breath.  Right-sided lung cancer. EXAM: PORTABLE CHEST 1 VIEW COMPARISON:  03/02/2018 FINDINGS: Metastatic nodules remain evident throughout the left lung without gross progression. Left lung is overall well aerated in there is no left-sided effusion. Opacification of the right hemithorax is slightly progressive, with pleural and parenchymal opacity related to advanced carcinoma. No acute bone finding. IMPRESSION: Slight progressive opacity of the right hemithorax with pleural and parenchymal density secondary to lung carcinoma. There is only minimal aeration in the right upper lobe. Metastatic nodules in the left lung appear similar to the previous study. Left lung is largely well aerated. Electronically Signed   By: Nelson Chimes M.D.   On: 04/27/2018 12:35   EKG Interpretation  Date/Time:  Friday April 27 2018 13:20:08 EST Ventricular Rate:  99 PR Interval:    QRS  Duration: 85 QT Interval:  348 QTC Calculation: 447 R Axis:   56 Text Interpretation:  Sinus rhythm Left atrial enlargement similar to prior 10/19 Confirmed by Aletta Edouard [phone]) on 04/27/2018 1:24:50 PM   Pending Labs Unresulted Labs (From admission,  onward)    Start     Ordered   04/27/18 1205  Culture, blood (routine x 2)  BLOOD CULTURE X 2,   STAT     04/27/18 1206   Signed and Held  CBC  (enoxaparin (LOVENOX)    CrCl >/= 30 ml/min)  Once,   R    Comments:  Baseline for enoxaparin therapy IF NOT ALREADY DRAWN.  Notify MD if PLT < 100 K.    Signed and Held   Signed and Held  Creatinine, serum  (enoxaparin (LOVENOX)    CrCl >/= 30 ml/min)  Once,   R    Comments:  Baseline for enoxaparin therapy IF NOT ALREADY DRAWN.    Signed and Held   Signed and Held  Creatinine, serum  (enoxaparin (LOVENOX)    CrCl >/= 30 ml/min)  Weekly,   R    Comments:  while on enoxaparin therapy    Signed and Held   Signed and Held  Basic metabolic panel  Tomorrow morning,   R     Signed and Held   Signed and Held  CBC  Tomorrow morning,   R     Signed and Held          Vitals/Pain Today's Vitals   04/27/18 1334 04/27/18 1432 04/27/18 1500 04/27/18 1530  BP: 124/73 117/76 122/89 113/68  Pulse: (!) 101 99 100 (!) 101  Resp: 19 15 (!) 23 (!) 22  Temp:      TempSrc:      SpO2: 99% 100% 100% 97%  Weight:      Height:      PainSc:        Isolation Precautions No active isolations  Medications Medications  ipratropium-albuterol (DUONEB) 0.5-2.5 (3) MG/3ML nebulizer solution 3 mL (3 mLs Nebulization Given 04/27/18 1230)  sodium chloride 0.9 % bolus 500 mL (500 mLs Intravenous New Bag/Given (Non-Interop) 04/27/18 1411)    Mobility walks

## 2018-04-27 NOTE — ED Notes (Addendum)
[place] AWARE ONLY BLOOD CULTURE OBTAINED. PT REQUESTING SOMETHING TO EAT AND DRINK

## 2018-04-27 NOTE — ED Triage Notes (Addendum)
[place] at [facility] for labs. Upon evalaution HR 121 02 sats 80% 3LNC. 6L NRB applied by [place] PA Lucianne Lei) that increased to 02 sats to 90%. HR now 114. Brought here for further evalaution. FULL CODE. Lung CA with METS.

## 2018-04-27 NOTE — ED Notes (Signed)
ED Provider at bedside.BUTLER 

## 2018-04-28 ENCOUNTER — Encounter (HOSPITAL_COMMUNITY): Payer: Self-pay | Admitting: Internal Medicine

## 2018-04-28 ENCOUNTER — Inpatient Hospital Stay (HOSPITAL_COMMUNITY): Payer: Medicare Other

## 2018-04-28 DIAGNOSIS — I10 Essential (primary) hypertension: Secondary | ICD-10-CM

## 2018-04-28 DIAGNOSIS — J181 Lobar pneumonia, unspecified organism: Secondary | ICD-10-CM

## 2018-04-28 DIAGNOSIS — J9621 Acute and chronic respiratory failure with hypoxia: Principal | ICD-10-CM

## 2018-04-28 DIAGNOSIS — C3491 Malignant neoplasm of unspecified part of right bronchus or lung: Secondary | ICD-10-CM

## 2018-04-28 DIAGNOSIS — J9690 Respiratory failure, unspecified, unspecified whether with hypoxia or hypercapnia: Secondary | ICD-10-CM

## 2018-04-28 DIAGNOSIS — I2699 Other pulmonary embolism without acute cor pulmonale: Secondary | ICD-10-CM

## 2018-04-28 DIAGNOSIS — Z7189 Other specified counseling: Secondary | ICD-10-CM

## 2018-04-28 DIAGNOSIS — I5022 Chronic systolic (congestive) heart failure: Secondary | ICD-10-CM

## 2018-04-28 DIAGNOSIS — Z515 Encounter for palliative care: Secondary | ICD-10-CM

## 2018-04-28 LAB — CBC
HCT: 38.6 % — ABNORMAL LOW (ref 39.0–52.0)
Hemoglobin: 11.9 g/dL — ABNORMAL LOW (ref 13.0–17.0)
MCH: 28.7 pg (ref 26.0–34.0)
MCHC: 30.8 g/dL (ref 30.0–36.0)
MCV: 93 fL (ref 80.0–100.0)
Platelets: 139 10*3/uL — ABNORMAL LOW (ref 150–400)
RBC: 4.15 MIL/uL — ABNORMAL LOW (ref 4.22–5.81)
RDW: 17.4 % — ABNORMAL HIGH (ref 11.5–15.5)
WBC: 5.3 10*3/uL (ref 4.0–10.5)
nRBC: 0 % (ref 0.0–0.2)

## 2018-04-28 LAB — BASIC METABOLIC PANEL
Anion gap: 10 (ref 5–15)
BUN: 6 mg/dL — ABNORMAL LOW (ref 8–23)
CO2: 30 mmol/L (ref 22–32)
Calcium: 7.9 mg/dL — ABNORMAL LOW (ref 8.9–10.3)
Chloride: 100 mmol/L (ref 98–111)
Creatinine, Ser: 0.72 mg/dL (ref 0.61–1.24)
GFR calc Af Amer: >60 mL/min (ref >60)
GFR calc non Af Amer: >60 mL/min (ref >60)
Glucose, Bld: 100 mg/dL — ABNORMAL HIGH (ref 70–99)
Potassium: 3.6 mmol/L (ref 3.5–5.1)
SODIUM: 140 mmol/L (ref 135–145)

## 2018-04-28 MED ORDER — KETOROLAC TROMETHAMINE 30 MG/ML IJ SOLN
30.0000 mg | Freq: Once | INTRAMUSCULAR | Status: AC
  Administered 2018-04-28: 30 mg via INTRAVENOUS
  Filled 2018-04-28: qty 1

## 2018-04-28 NOTE — Progress Notes (Addendum)
[facility] PROGRESS NOTE    Progress Note  Bradley Maldonado  EXH:[id number] DOB: [birth year] DOA: 04/27/2018 PCP: [name], NP-C     Brief Narrative:   Bradley Maldonado is an 67 y.o. male past medical history significant of stage IV non-small cell lung cancer with brain metastases treated with palliative chemo and radiation, chronic systolic heart failure with an EF of 25% and pulmonary embolism comes in for worsening dyspnea, the patient has been bedridden due to severe dyspnea, accompanied by congestion and progressive productive cough on the day of admission he went to see the oncologist and found saturations at 80% so he was evaluated for further evaluation  Assessment/Plan:   Acute on chronic respiratory failure with hypoxia (HCC)/possible right lobar pneumonia: Chest x-ray shows significant opacity and infiltrates, he is hypoxic, with a productive cough and dyspnea. He was started empirically on IV antibiotics, he has remained afebrile, with no leukocytosis. We will get a CT chest without contrast as I am concerned about progression of disease. Agreed to meet with hospice and palliative care.  Chronic systolic heart failure: Continue Coreg and losartan. Discontinue Lasix, with him a BNP of 37 is unlikely he is fluid overloaded.  Essential hypertension: Continue Coreg blood pressure has been stable.  Stage IV non-small cell lung cancer/  Brain metastases [facility]): Currently on immunotherapy Continue Decadron.  Will contact hospice and palliative care.  History of pulmonary embolism (HCC) Continue Eliquis.  RN Pressure Injury Documentation:   Severe protein caloric malnutrition: Start Ensure he is cachectic Estimated body mass index is 20.85 kg/m as calculated from the following:   Height as of this encounter: 5\' 11"  (1.803 m).   Weight as of this encounter: 67.8 kg.  DVT prophylaxis: eliquis Family Communication:none Disposition Plan/Barrier to D/C:  once breathing improved Code Status:     Code Status Orders  (From admission, onward)         Start     Ordered   04/27/18 1614  Full code  Continuous     04/27/18 1613        Code Status History    Date Active Date Inactive Code Status Order ID Comments User Context   03/01/2018 1559 03/06/2018 1510 Full Code [id number]  [name], MD Inpatient   12/17/2016 1707 12/21/2016 2036 Full Code [id number]  Shela Leff, MD ED        IV Access:    Peripheral IV   Procedures and diagnostic studies:   Dg Chest Port 1 View  Result Date: 04/27/2018 CLINICAL DATA:  Shortness of breath.  Right-sided lung cancer. EXAM: PORTABLE CHEST 1 VIEW COMPARISON:  03/02/2018 FINDINGS: Metastatic nodules remain evident throughout the left lung without gross progression. Left lung is overall well aerated in there is no left-sided effusion. Opacification of the right hemithorax is slightly progressive, with pleural and parenchymal opacity related to advanced carcinoma. No acute bone finding. IMPRESSION: Slight progressive opacity of the right hemithorax with pleural and parenchymal density secondary to lung carcinoma. There is only minimal aeration in the right upper lobe. Metastatic nodules in the left lung appear similar to the previous study. Left lung is largely well aerated. Electronically Signed   By: Nelson Chimes M.D.   On: 04/27/2018 12:35     Medical Consultants:    None.  Anti-Infectives:   IV zosyn  Subjective:    Bradley Maldonado relates his breathing is better compared to yesterday.  Objective:    Vitals:   04/28/18 0334  04/28/18 0500 04/28/18 0600 04/28/18 0700  BP:  (!) 86/51 (!) 102/51 (!) 90/50  Pulse:  95 95 92  Resp:  19 (!) 21 18  Temp: (!) 96.7 F (35.9 C)     TempSrc: Axillary     SpO2:  92% 90% (!) 89%  Weight:      Height:        Intake/Output Summary (Last 24 hours) at 04/28/2018 0731 Last data filed at 04/28/2018 0600 Gross per 24 hour  Intake  1161.59 ml  Output 2100 ml  Net -938.41 ml   Filed Weights   04/27/18 1212 04/27/18 1616  Weight: 68.9 kg 67.8 kg    Exam: General exam: In no acute distress, cachectic Respiratory system: Good air movement and clear to auscultation. Cardiovascular system: S1 & S2 heard, RRR. No JVD. Gastrointestinal system: Abdomen is nondistended, soft and nontender.  Central nervous system: Alert and oriented. No focal neurological deficits. Extremities: No pedal edema. Skin: No rashes, lesions or ulcers Psychiatry: Judgement and insight appear normal. Mood & affect appropriate.    Data Reviewed:    Labs: Basic Metabolic Panel: Recent Labs  Lab 04/27/18 1305 04/28/18 0315  NA 142 140  K 3.4* 3.6  CL 100 100  CO2 32 30  GLUCOSE 135* 100*  BUN 7* 6*  CREATININE 0.63 0.72  CALCIUM 8.4* 7.9*   GFR Estimated Creatinine Clearance: 84.8 mL/min (by C-G formula based on SCr of 0.72 mg/dL). Liver Function Tests: No results for input(s): AST, ALT, ALKPHOS, BILITOT, PROT, ALBUMIN in the last 168 hours. No results for input(s): LIPASE, AMYLASE in the last 168 hours. No results for input(s): AMMONIA in the last 168 hours. Coagulation profile No results for input(s): INR, PROTIME in the last 168 hours.  CBC: Recent Labs  Lab 04/27/18 1305 04/28/18 0315  WBC 6.6 5.3  NEUTROABS 5.1  --   HGB 13.0 11.9*  HCT 42.2 38.6*  MCV 92.5 93.0  PLT 152 139*   Cardiac Enzymes: No results for input(s): CKTOTAL, CKMB, CKMBINDEX, TROPONINI in the last 168 hours. BNP (last 3 results) No results for input(s): PROBNP in the last 8760 hours. CBG: No results for input(s): GLUCAP in the last 168 hours. D-Dimer: No results for input(s): DDIMER in the last 72 hours. Hgb A1c: No results for input(s): HGBA1C in the last 72 hours. Lipid Profile: No results for input(s): CHOL, HDL, LDLCALC, TRIG, CHOLHDL, LDLDIRECT in the last 72 hours. Thyroid function studies: No results for input(s): TSH, T4TOTAL,  T3FREE, THYROIDAB in the last 72 hours.  Invalid input(s): FREET3 Anemia work up: No results for input(s): VITAMINB12, FOLATE, FERRITIN, TIBC, IRON, RETICCTPCT in the last 72 hours. Sepsis Labs: Recent Labs  Lab 04/27/18 1257 04/27/18 1305 04/27/18 1539 04/28/18 0315  WBC  --  6.6  --  5.3  LATICACIDVEN 2.01*  --  1.36  --    Microbiology No results found for this or any previous visit (from the past 240 hour(s)).   Medications:   . apixaban  5 mg Oral BID  . carvedilol  3.125 mg Oral BID WC  . furosemide  40 mg Intravenous Daily  . ipratropium-albuterol  3 mL Nebulization Q6H  . mouth rinse  15 mL Mouth Rinse BID  . polyethylene glycol  17 g Oral Daily  . senna-docusate  1 tablet Oral QHS   Continuous Infusions: . sodium chloride Stopped (04/28/18 0005)  . piperacillin-tazobactam (ZOSYN)  IV Stopped (04/28/18 0405)      LOS: 1  day   Charlynne Cousins  [facility]   *Please refer to [email or web address], password [email or web address] to get updated schedule on who will round on this patient, as hospitalists switch teams weekly. If 7PM-7AM, please contact night-coverage at [email or web address], password [email or web address] for any overnight needs.  04/28/2018, 7:31 AM

## 2018-04-28 NOTE — Consult Note (Signed)
Name: Bradley Maldonado MRN: [id number] DOB: [birth year]    ADMISSION DATE:  04/27/2018 CONSULTATION DATE:  04/28/2018  REFERRING MD :  Aileen Fass  CHIEF COMPLAINT: Respiratory failure  BRIEF PATIENT DESCRIPTION: 67 year old with advanced metastatic lung cancer and chronic right-sided atelectasis with acute respiratory failure requiring BiPAP  SIGNIFICANT EVENTS  12/6 required nonrebreather 12/7 placed on BiPAP after return from CT scan  STUDIES:  12/7 CT chest without contrast-progression of tumor with worsening bone mets, extensive tumor in the right middle and lower lobe consolidation, subpulmonic effusion, small left effusion and left lower lobe atelectasis   HISTORY OF PRESENT ILLNESS: 67 year old ex-smoker with stage IV adenocarcinoma of the lung diagnosed in July/2018 when he presented with right hilar lymphadenopathy and malignant right effusion.  Molecular studies were negative for mutations.  He was treated with Beryle Flock initially but this was discontinued in 10/29 due to disease progression and he was found to have multiple brain metastases.  He completed brain radiation on 11/15 and was supposed to start on his palliative systemic chemotherapy with carboplatin and Alimta In October 2019 he was diagnosed with lobar pulmonary embolism and has been on apixaban since then which he is only taking once daily  He arrived to the cancer center on 12/6 and was noted to be hypoxic requiring higher flow oxygen, at baseline he is on 2 L at home.  He was admitted and required nonrebreather.  After transported to CT scan this morning he developed respiratory distress and was placed on a BiPAP  I reviewed his serial imaging studies which shows progression of tumor in his right lung and chronic atelectasis of the right lung dating back at least 2 first week of October.  I also reviewed his prior CT scans and his current one  PAST MEDICAL HISTORY :   has a past medical history of  Adenocarcinoma of right lung, stage 4 ([place]) (01/24/2017), Goals of care, counseling/discussion (01/24/2017), Hypertension, and Respiratory failure ([place]) (04/27/2018).  has a past surgical history that includes Hip replacement; IR THORACENTESIS ASP PLEURAL SPACE W/IMG GUIDE (12/19/2016); Eye surgery (Right); Chest tube insertion (Right, 02/06/2017); and Removal of pleural drainage catheter (Right, 03/21/2017). Prior to Admission medications   Medication Sig Start Date End Date Taking? Authorizing Provider  acetaminophen (TYLENOL) 650 MG CR tablet Take 650 mg by mouth every 8 (eight) hours as needed for pain.   Yes [provider]  carvedilol (COREG) 3.125 MG tablet Take 1 tablet (3.125 mg total) by mouth 2 (two) times daily with a meal. 03/05/18  Yes Lavina Hamman, MD  ELIQUIS STARTER PACK (ELIQUIS STARTER PACK) 5 MG TABS Take as directed on package: start with two-5mg  tablets twice daily for 7 days. On day 8, switch to one-5mg  tablet twice daily. Patient taking differently: Take 5 mg by mouth daily.  03/05/18  Yes Lavina Hamman, MD  polyethylene glycol [facility] / GLYCOLAX) packet Take 17 g by mouth daily. 03/06/18  Yes Lavina Hamman, MD  senna-docusate (SENOKOT-S) 8.6-50 MG tablet Take 1 tablet by mouth at bedtime. 03/05/18  Yes Lavina Hamman, MD  dexamethasone (DECADRON) 4 MG tablet Take 1 tab twice daily. On 11-14 reduce dose to one tab daily. On 11-21 reduce dose to 1/2 tab daily. Last dose on 12-1. Patient not taking: Reported on 04/27/2018 04/02/18   Eppie Gibson, MD  dexamethasone (DECADRON) 4 MG tablet 4 mg p.o. twice daily the day before, day of and day after chemotherapy every 3 weeks. Patient not  taking: Reported on 04/27/2018 04/04/18   Curt Bears, MD  diazepam (VALIUM) 5 MG tablet Take 1 tablet PO 45 minutes before MRI, and 45 minutes before mask fabrication, and 45 minutes before brain radiation therapy. Patient not taking: Reported on 04/04/2018 03/07/18   Eppie Gibson,  MD  fluconazole (DIFLUCAN) 100 MG tablet Take 2 tablets today, then 1 tablet daily x 20 more days. Patient not taking: Reported on 04/27/2018 03/07/18   Eppie Gibson, MD  folic acid (FOLVITE) 1 MG tablet Take 1 tablet (1 mg total) by mouth daily. Patient not taking: Reported on 04/27/2018 04/04/18   Curt Bears, MD  LORazepam (ATIVAN) 1 MG tablet Take 1 tablet by mouth 20-30 min before radiation therapy. Patient not taking: Reported on 04/27/2018 03/21/18   Eppie Gibson, MD  losartan (COZAAR) 25 MG tablet Take 1 tablet (25 mg total) by mouth daily. Patient not taking: Reported on 04/27/2018 03/06/18   Lavina Hamman, MD  prochlorperazine (COMPAZINE) 10 MG tablet Take 1 tablet (10 mg total) by mouth every 6 (six) hours as needed for nausea or vomiting. Patient not taking: Reported on 04/27/2018 04/04/18   Curt Bears, MD   No Known Allergies  FAMILY HISTORY:  family history includes Cancer in his father; Cancer - Lung in his father; Diabetes in his mother; Lung cancer in his father. SOCIAL HISTORY:  reports that he quit smoking about 3 months ago. He has never used smokeless tobacco. He reports that he drinks about 14.0 standard drinks of alcohol per week. He reports that he has current or past drug history. Drugs: Marijuana and Cocaine.  REVIEW OF SYSTEMS:   Positive for shortness of breath, weight loss, dry cough  Constitutional: Negative for fever, chills, weight loss, malaise/fatigue and diaphoresis.  HENT: Negative for hearing loss, ear pain, nosebleeds, congestion, sore throat, neck pain, tinnitus and ear discharge.   Eyes: Negative for blurred vision, double vision, photophobia, pain, discharge and redness.  Respiratory: Negative for hemoptysis, sputum production,, wheezing and stridor.   Cardiovascular: Negative for  palpitations, orthopnea, claudication, leg swelling and PND.  Gastrointestinal: Negative for heartburn, nausea, vomiting, abdominal pain, diarrhea,  constipation, blood in stool and melena.  Genitourinary: Negative for dysuria, urgency, frequency, hematuria and flank pain.  Musculoskeletal: Negative for myalgias, back pain, joint pain and falls.  Skin: Negative for itching and rash.  Neurological: Negative for dizziness, tingling, tremors, sensory change, speech change, focal weakness, seizures, loss of consciousness, weakness and headaches.  Endo/Heme/Allergies: Negative for environmental allergies and polydipsia. Does not bruise/bleed easily.  SUBJECTIVE:   VITAL SIGNS: Temp:  [96.7 F (35.9 C)-98.5 F (36.9 C)] 98.1 F (36.7 C) (12/07 1200) Pulse Rate:  [87-135] 100 (12/07 1200) Resp:  [15-26] 22 (12/07 1200) BP: (82-218)/(50-119) 113/72 (12/07 1200) SpO2:  [82 %-100 %] 91 % (12/07 1200) FiO2 (%):  [100 %] 100 % (12/07 1200) Weight:  [67.8 kg] 67.8 kg (12/06 1616)  PHYSICAL EXAMINATION: General: Cachectic man, sitting up in bed, on BiPAP full facemask, no distress Neuro: Alert and able to speak in full sentences through the mask, nonfocal HEENT: Mild pallor, no icterus Cardiovascular: S1-S2 mild tacky, no murmur Lungs: Decreased breath sounds on right, clear on left, no rhonchi Abdomen: Soft, nontender, scaphoid Musculoskeletal: No deformity Skin: No rash, no edema  Recent Labs  Lab 04/27/18 1305 04/28/18 0315  NA 142 140  K 3.4* 3.6  CL 100 100  CO2 32 30  BUN 7* 6*  CREATININE 0.63 0.72  GLUCOSE 135* 100*  Recent Labs  Lab 04/27/18 1305 04/28/18 0315  HGB 13.0 11.9*  HCT 42.2 38.6*  WBC 6.6 5.3  PLT 152 139*   Ct Chest Wo Contrast  Result Date: 04/28/2018 CLINICAL DATA:  67 year old male with a history of stage IV non-small cell lung cancer with brain metastases currently undergoing palliative chemo radiation therapy. Patient additionally has chronic systolic heart failure with an ejection fraction of 25%, recent pulmonary embolus and now worsening dyspnea and productive cough. EXAM: CT CHEST WITHOUT  CONTRAST TECHNIQUE: Multidetector CT imaging of the chest was performed following the standard protocol without IV contrast. COMPARISON:  Most recent prior CT scan of the chest 03/02/2018 FINDINGS: Cardiovascular: Limited evaluation in the absence of intravenous contrast. Atherosclerotic calcifications visualized along the aorta. The heart is within normal limits for size. Calcifications present throughout the coronary arteries. No pericardial effusion. Mediastinum/Nodes: Limited evaluation in the absence of intravenous contrast. Diffuse mediastinal adenopathy is again noted. An index left paratracheal lymph node measures 1.3 cm in short axis. A juxta cardiac lymph node measures 1.2 cm in short axis. Unremarkable thoracic esophagus. Lungs/Pleura: Progressive patchy airspace opacity throughout the right upper lung. The right middle and right lower lobes remain completely opacified. Scattered metastatic pulmonary nodules again noted throughout the left lung. Many of the nodules demonstrate slight interval enlargement consistent with disease progression. There is significant respiratory motion artifact. New small left pleural effusion and associated left lower lobe atelectasis. Persistent loculated right subpulmonic pleural effusion with a thick surrounding wall likely representing tumor. Upper Abdomen: No acute abnormality within the visualized upper abdomen. Small volume ascites. Musculoskeletal: Multifocal sclerotic osseous metastases. IMPRESSION: 1. Continued progression of stage IV metastatic lung cancer with multiple new blastic osseous metastases and worsening diffuse interstitial spread of disease throughout the right upper lobe. The right middle and lower lobes are completely replaced by tumor and volume loss. There is minimal residual aerated lung on the right. Numerous metastatic pulmonary nodules on the left are slightly enlarged. 2. New small left pleural effusion and associated left lower lobe  atelectasis. 3. Stable entrapped right sub pulmonic pleural effusion versus central necrosis within tumor. 4. Additional ancillary findings as above without significant interval change. Electronically Signed   By: Jacqulynn Cadet M.D.   On: 04/28/2018 10:39   Dg Chest Port 1 View  Result Date: 04/27/2018 CLINICAL DATA:  Shortness of breath.  Right-sided lung cancer. EXAM: PORTABLE CHEST 1 VIEW COMPARISON:  03/02/2018 FINDINGS: Metastatic nodules remain evident throughout the left lung without gross progression. Left lung is overall well aerated in there is no left-sided effusion. Opacification of the right hemithorax is slightly progressive, with pleural and parenchymal opacity related to advanced carcinoma. No acute bone finding. IMPRESSION: Slight progressive opacity of the right hemithorax with pleural and parenchymal density secondary to lung carcinoma. There is only minimal aeration in the right upper lobe. Metastatic nodules in the left lung appear similar to the previous study. Left lung is largely well aerated. Electronically Signed   By: Nelson Chimes M.D.   On: 04/27/2018 12:35    ASSESSMENT / PLAN:  Acute respiratory failure with hypoxia Metastatic adenocarcinoma lung to brain and bone  -He clearly has disease progression.  He has chronic atelectasis of his right lung for at least the past 2 months and now also has some left lung involvement.  He was recently diagnosed with pulmonary embolism and was apparently only taking his apixaban once a day. I am not sure there is anything reversible here, the  hope is that he may have a mucous plug which may improve with a short support of BiPAP or since he was  inadequately anticoagulated, this might improve with anticoagulation.  I clearly expressed to him in presence of his sisters and his dear friend and caregiver that life support such as mechanical ventilation would not be of benefit to him given his terminal cancer.  I think we could continue  all other medical therapies including antibiotics and BiPAP for the short-term 24 to 48 hours to see if he improves  He will discuss this with his family, his friend indicated that he has had 7-5 conversations with him  Kara Mead MD. Shade Flood. [facility] Pager [phone] If no response call [phone]    04/28/2018, 12:54 PM

## 2018-04-28 NOTE — Progress Notes (Signed)
Pt with significant respiratory distress after CT scan performed. HR up to 130's, O2 Saturations in the low 80's on 100% NRB. Lungs sound more congested than on initial AM assessment. Dr. Aileen Fass made aware, orders received to initiate BIPAP.

## 2018-04-29 ENCOUNTER — Other Ambulatory Visit: Payer: Self-pay | Admitting: Hematology and Oncology

## 2018-04-29 DIAGNOSIS — I429 Cardiomyopathy, unspecified: Secondary | ICD-10-CM

## 2018-04-29 DIAGNOSIS — Z86711 Personal history of pulmonary embolism: Secondary | ICD-10-CM

## 2018-04-29 DIAGNOSIS — Z6820 Body mass index (BMI) 20.0-20.9, adult: Secondary | ICD-10-CM

## 2018-04-29 DIAGNOSIS — J90 Pleural effusion, not elsewhere classified: Secondary | ICD-10-CM

## 2018-04-29 DIAGNOSIS — C7951 Secondary malignant neoplasm of bone: Secondary | ICD-10-CM

## 2018-04-29 DIAGNOSIS — E440 Moderate protein-calorie malnutrition: Secondary | ICD-10-CM

## 2018-04-29 DIAGNOSIS — J9600 Acute respiratory failure, unspecified whether with hypoxia or hypercapnia: Secondary | ICD-10-CM

## 2018-04-29 DIAGNOSIS — F17201 Nicotine dependence, unspecified, in remission: Secondary | ICD-10-CM

## 2018-04-29 DIAGNOSIS — Z7901 Long term (current) use of anticoagulants: Secondary | ICD-10-CM

## 2018-04-29 DIAGNOSIS — C3432 Malignant neoplasm of lower lobe, left bronchus or lung: Secondary | ICD-10-CM

## 2018-04-29 LAB — BASIC METABOLIC PANEL
Anion gap: 7 (ref 5–15)
BUN: 7 mg/dL — AB (ref 8–23)
CO2: 34 mmol/L — ABNORMAL HIGH (ref 22–32)
Calcium: 7.9 mg/dL — ABNORMAL LOW (ref 8.9–10.3)
Chloride: 99 mmol/L (ref 98–111)
Creatinine, Ser: 0.74 mg/dL (ref 0.61–1.24)
GFR calc Af Amer: >60 mL/min (ref >60)
GFR calc non Af Amer: >60 mL/min (ref >60)
Glucose, Bld: 106 mg/dL — ABNORMAL HIGH (ref 70–99)
Potassium: 3.4 mmol/L — ABNORMAL LOW (ref 3.5–5.1)
Sodium: 140 mmol/L (ref 135–145)

## 2018-04-29 LAB — GLUCOSE, CAPILLARY
GLUCOSE-CAPILLARY: 92 mg/dL (ref 70–99)
Glucose-Capillary: 102 mg/dL — ABNORMAL HIGH (ref 70–99)

## 2018-04-29 LAB — CBC
HCT: 39.4 % (ref 39.0–52.0)
Hemoglobin: 11.7 g/dL — ABNORMAL LOW (ref 13.0–17.0)
MCH: 27.4 pg (ref 26.0–34.0)
MCHC: 29.7 g/dL — ABNORMAL LOW (ref 30.0–36.0)
MCV: 92.3 fL (ref 80.0–100.0)
Platelets: 153 10*3/uL (ref 150–400)
RBC: 4.27 MIL/uL (ref 4.22–5.81)
RDW: 17.4 % — ABNORMAL HIGH (ref 11.5–15.5)
WBC: 5.6 10*3/uL (ref 4.0–10.5)
nRBC: 0 % (ref 0.0–0.2)

## 2018-04-29 MED ORDER — POTASSIUM CHLORIDE CRYS ER 20 MEQ PO TBCR
40.0000 meq | EXTENDED_RELEASE_TABLET | Freq: Two times a day (BID) | ORAL | Status: AC
  Administered 2018-04-29 (×2): 40 meq via ORAL
  Filled 2018-04-29: qty 2

## 2018-04-29 MED ORDER — ORAL CARE MOUTH RINSE
15.0000 mL | Freq: Two times a day (BID) | OROMUCOSAL | Status: DC
  Administered 2018-04-29 – 2018-05-13 (×14): 15 mL via OROMUCOSAL

## 2018-04-29 MED ORDER — POTASSIUM CHLORIDE CRYS ER 20 MEQ PO TBCR
40.0000 meq | EXTENDED_RELEASE_TABLET | Freq: Once | ORAL | Status: DC
  Filled 2018-04-29: qty 2

## 2018-04-29 MED ORDER — DEXAMETHASONE SODIUM PHOSPHATE 4 MG/ML IJ SOLN
12.0000 mg | Freq: Two times a day (BID) | INTRAMUSCULAR | Status: DC
  Administered 2018-04-29 – 2018-05-15 (×33): 12 mg via INTRAVENOUS
  Filled 2018-04-29 (×33): qty 3

## 2018-04-29 MED ORDER — CHLORHEXIDINE GLUCONATE 0.12 % MT SOLN
15.0000 mL | Freq: Two times a day (BID) | OROMUCOSAL | Status: DC
  Administered 2018-04-29 – 2018-05-15 (×31): 15 mL via OROMUCOSAL
  Filled 2018-04-29 (×28): qty 15

## 2018-04-29 NOTE — Progress Notes (Signed)
[facility] CONSULT NOTE  Patient Care Team: [name], NP-C as PCP - General (Family Medicine) Prescott Gum, Collier Salina, MD as Consulting Physician (Cardiothoracic Surgery)  ASSESSMENT & PLAN:   Diffuse stage IV metastatic lung cancer The patient is very frail.  It is not clear to me that he would be a strong candidate to pursue palliative chemotherapy while hospitalized.  I will defer this decision to his primary medical oncologist, Dr. Julien Nordmann  Progressive respiratory failure He has negligible air exchange through the right lung due to diffuse metastatic cancer.  The left lower lobe is affected by tumor along with pleural effusion and atelectasis.  [name] receiving maximum oxygen support through nonrebreather, along with intravenous antibiotic therapy and scheduled nebulizer treatment. I recommend the addition of IV dexamethasone that might help with opening of his airway and reduce congestion from possible mucus impaction. However, this will not necessarily help prevent possibility of progression to respiratory failure needing intubation. I have long discussion with the patient the risk and benefits of intubation.  He appears to be reluctant to discuss this option.  [name] aware that he may not be able to get off the ventilator if he is intubated due to multiple comorbidities including recent pulmonary emboli, cardiomyopathy, and progressive weakness from cancer progression and recent radiation therapy to the brain that could affect his physical strength. For now, he wants [place] to try all noninvasive options possible to keep him off the ventilator but at the end of our discussion, if this is necessary we could keep him alive, he would not object to being intubated.  Moderate protein calorie malnutrition Physically, he look malnourished and cachectic.  Recommend dietitian review.  Recent PE and cardiomyopathy Continue anticoagulation therapy  Goals of care/discharge planning The  patient is very ill.  I have asked his permission to discuss his current situation with his daughter but the patient does not want me to talk to his daughter.  It appears that despite his progressive decline in overall performance status, he wants everything done to stay alive. I am hopeful, the additional corticosteroid therapy may improve his breathing status.  I would defer further discussion to his primary medical oncologist, Dr. Julien Nordmann tomorrow with the patient and family.  All questions were answered. The patient knows to call the clinic with any problems, questions or concerns.  Heath Lark, MD 04/29/2018 9:45 AM  CHIEF COMPLAINTS/PURPOSE OF CONSULTATION:  Progressive respiratory failure, in the setting of diffuse metastatic stage IV lung cancer to the brain and bones  HISTORY OF PRESENTING ILLNESS:  Bradley Maldonado 67 y.o. male is seen at the request of hospitalist service to discuss plan of care with the patient. I have reviewed his chart and materials related to his cancer extensively and collaborated history with the patient.  This patient is under the care of Dr. Julien Nordmann, with diagnosis of stage IV metastatic lung cancer to the brain and the bones. The patient had received treatment with pembrolizumab and was noted to have disease progression recently.  He had completed palliative radiation therapy and recent dexamethasone taper.  Of note, the patient had history of cardiomyopathy with known ejection fraction around 25% based on echocardiogram from March 03, 2018.  The cardiomyopathy was likely related to diagnosis of significant pulmonary emboli detected on previous CT angiogram dated March 02, 2018.  The patient has remained on anticoagulation therapy  He was supposed to start chemotherapy recently on April 13, 2018 but did not show up  to his appointment. He was evaluated at the symptom management clinic on April 27, 2018 due to tachycardia, hypoxia and worsening shortness of  breath.  He was directed to the emergency department and was subsequently admitted to the hospital for further management.  [name] seen at the stepdown unit.  He has nonrebreather mask to keep oxygen saturation greater than 90%.  He continues to have tachycardia.  He was started on broad-spectrum intravenous antibiotics for possible pneumonia.  On admission, he underwent CT imaging of the chest without contrast.  I have reviewed the imaging study myself which show worsening progression of stage IV metastatic lung cancer with diffuse interstitial spread of disease throughout the right lung.  It appears that there is significant volume loss throughout the right lung.  There are also numerous lung nodules affecting the left lung along with pleural effusion and left lower lobe atelectasis.  There were no family members present.  He stated his next of kin is his daughter.  He felt a bit better since admission.  He has chronic cough.  Denies recent hemoptysis.  He denies recent headache and new neurological deficits  MEDICAL HISTORY:  Past Medical History:  Diagnosis Date  . Adenocarcinoma of right lung, stage 4 ([place]) 01/24/2017  . Goals of care, counseling/discussion 01/24/2017  . Hypertension   . Respiratory failure ([place]) 04/27/2018    SURGICAL HISTORY: Past Surgical History:  Procedure Laterality Date  . CHEST TUBE INSERTION Right 02/06/2017   Procedure: INSERTION PLEURAL DRAINAGE CATHETER;  Surgeon: Ivin Poot, MD;  Location: [facility];  Service: Thoracic;  Laterality: Right;  . EYE SURGERY Right    metal removed from eye  . Hip replacement    . IR THORACENTESIS ASP PLEURAL SPACE W/IMG GUIDE  12/19/2016  . REMOVAL OF PLEURAL DRAINAGE CATHETER Right 03/21/2017   Procedure: REMOVAL OF RIGHT PLEURAL DRAINAGE CATHETER;  Surgeon: Ivin Poot, MD;  Location: [facility];  Service: Thoracic;  Laterality: Right;    SOCIAL HISTORY: Social History   Socioeconomic History  . Marital status: Divorced     Spouse name: Not on file  . Number of children: Not on file  . Years of education: Not on file  . Highest education level: Not on file  Occupational History  . Not on file  Social Needs  . Financial resource strain: Not on file  . Food insecurity:    Worry: Not on file    Inability: Not on file  . Transportation needs:    Medical: Yes    Non-medical: Yes  Tobacco Use  . Smoking status: Former Smoker    Last attempt to quit: 01/21/2018    Years since quitting: 0.2  . Smokeless tobacco: Never Used  Substance and Sexual Activity  . Alcohol use: Yes    Alcohol/week: 14.0 standard drinks    Types: 14 Cans of beer per week    Comment: occasionally  now since cancer diagnosis, he denies drinking alcohol in the last  6 days (03/07/18)  . Drug use: Yes    Types: Marijuana, Cocaine    Comment: using cocaine 1-2 times per week, he denies using  either drug "in a long time"   . Sexual activity: Not Currently  Lifestyle  . Physical activity:    Days per week: Not on file    Minutes per session: Not on file  . Stress: Not on file  Relationships  . Social connections:    Talks on phone: Not on file  Gets together: Not on file    Attends religious service: Not on file    Active member of club or organization: Not on file    Attends meetings of clubs or organizations: Not on file    Relationship status: Not on file  . Intimate partner violence:    Fear of current or ex partner: No    Emotionally abused: No    Physically abused: No    Forced sexual activity: No  Other Topics Concern  . Not on file  Social History Narrative   [facility] (12/19/16):   No pets currently. No mold exposure. No recent travel.    FAMILY HISTORY: Family History  Problem Relation Age of Onset  . Diabetes Mother   . Lung cancer Father   . Cancer Father   . Cancer - Lung Father     ALLERGIES:  has No Known Allergies.  MEDICATIONS:  Current Facility-Administered Medications  Medication  Dose Route Frequency Provider Last Rate Last Dose  . 0.9 %  sodium chloride infusion   Intravenous PRN Arrien, Jimmy Picket, MD   Stopped at 04/28/18 0005  . acetaminophen (TYLENOL) tablet 650 mg  650 mg Oral Q6H PRN Arrien, Jimmy Picket, MD       Or  . acetaminophen (TYLENOL) suppository 650 mg  650 mg Rectal Q6H PRN Arrien, Jimmy Picket, MD      . apixaban [facility] [place]) tablet 5 mg  5 mg Oral BID Arrien, Jimmy Picket, MD   5 mg at 04/29/18 0843  . carvedilol (COREG) tablet 3.125 mg  3.125 mg Oral BID WC Arrien, Jimmy Picket, MD   3.125 mg at 04/29/18 0843  . chlorhexidine (PERIDEX) 0.12 % solution 15 mL  15 mL Mouth Rinse BID Charlynne Cousins, MD      . ipratropium-albuterol (DUONEB) 0.5-2.5 (3) MG/3ML nebulizer solution 3 mL  3 mL Nebulization Q6H Arrien, Jimmy Picket, MD   3 mL at 04/29/18 0716  . ipratropium-albuterol (DUONEB) 0.5-2.5 (3) MG/3ML nebulizer solution 3 mL  3 mL Nebulization Q2H PRN Arrien, Jimmy Picket, MD      . MEDLINE mouth rinse  15 mL Mouth Rinse q12n4p Charlynne Cousins, MD      . ondansetron [facility]) tablet 4 mg  4 mg Oral Q6H PRN Arrien, Jimmy Picket, MD       Or  . ondansetron [facility]) injection 4 mg  4 mg Intravenous Q6H PRN Arrien, Jimmy Picket, MD      . piperacillin-tazobactam (ZOSYN) IVPB 3.375 g  3.375 g Intravenous Q8H Pham, Anh P, RPH 12.5 mL/hr at 04/29/18 0845 3.375 g at 04/29/18 0845  . polyethylene glycol (MIRALAX / GLYCOLAX) packet 17 g  17 g Oral Daily Tawni Millers, MD   17 g at 04/29/18 0843  . potassium chloride SA (K-DUR,KLOR-CON) CR tablet 40 mEq  40 mEq Oral BID Charlynne Cousins, MD   40 mEq at 04/29/18 0850  . senna-docusate (Senokot-S) tablet 1 tablet  1 tablet Oral QHS Arrien, Jimmy Picket, MD   1 tablet at 04/28/18 2104    REVIEW OF SYSTEMS:   Constitutional: Denies fevers, chills or abnormal night sweats Eyes: Denies blurriness of vision, double vision or watery eyes Ears, nose, mouth, throat,  and face: Denies mucositis or sore throat Gastrointestinal:  Denies nausea, heartburn or change in bowel habits Skin: Denies abnormal skin rashes Lymphatics: Denies new lymphadenopathy or easy bruising Neurological:Denies numbness, tingling or new weaknesses Behavioral/Psych: Mood is stable, no new changes  All  other systems were reviewed with the patient and are negative.  PHYSICAL EXAMINATION: ECOG PERFORMANCE STATUS: 3 - Symptomatic, >50% confined to bed  Vitals:   04/29/18 0831 04/29/18 0900  BP:  110/71  Pulse: (!) 109 (!) 102  Resp: (!) 22 17  Temp:    SpO2: (!) 87% 96%   Filed Weights   04/27/18 1212 04/27/18 1616  Weight: 152 lb (68.9 kg) 149 lb 7.6 oz (67.8 kg)    GENERAL:alert, he has nonrebreather mask.  He looks frail.  Noted mild cachectic with temporal muscle wasting SKIN: skin color, texture, turgor are normal, no rashes or significant lesions EYES: normal, conjunctiva are pink and non-injected, sclera clear OROPHARYNX: Noted dry mucous membrane NECK: supple, thyroid normal size, non-tender, without nodularity LYMPH:  no palpable lymphadenopathy in the cervical, axillary or inguinal LUNGS: Increased respiratory effort.  Mild wheezes bilateral.  Reduced breath sound on the right lung.  Noted crackles on the left lung base HEART: tachycardia no murmurs with mild bilateral lower extremity edema ABDOMEN:abdomen soft, non-tender and normal bowel sounds Musculoskeletal:no cyanosis of digits and no clubbing  PSYCH: alert & oriented x 3 with fluent speech NEURO: not assessed due to diffuse weakness  LABORATORY DATA:  I have reviewed the data as listed Lab Results  Component Value Date   WBC 5.6 04/29/2018   HGB 11.7 (L) 04/29/2018   HCT 39.4 04/29/2018   MCV 92.3 04/29/2018   PLT 153 04/29/2018   Recent Labs    02/22/18 0900  03/15/18 0909 04/04/18 1208 04/27/18 1305 04/28/18 0315 04/29/18 0352  NA 140   < > 138 136 142 140 140  K 4.1   < > 4.2 4.0 3.4*  3.6 3.4*  CL 103   < > 98 96* 100 100 99  CO2 30   < > 28 30 32 30 34*  GLUCOSE 113*   < > 254* 430* 135* 100* 106*  BUN 9   < > 18 18 7* 6* 7*  CREATININE 0.78   < > 0.74 0.82 0.63 0.72 0.74  CALCIUM 8.9   < > 8.9 8.7* 8.4* 7.9* 7.9*  GFRNONAA >60   < > >60 >60 >60 >60 >60  GFRAA >60   < > >60 >60 >60 >60 >60  PROT 6.8  --  6.2* 5.7*  --   --   --   ALBUMIN 2.9*  --  3.0* 2.8*  --   --   --   AST 20  --  12* 14*  --   --   --   ALT 13  --  23 23  --   --   --   ALKPHOS 90  --  81 84  --   --   --   BILITOT 0.3  --  0.4 0.4  --   --   --    < > = values in this interval not displayed.    RADIOGRAPHIC STUDIES: I have personally reviewed the radiological images as listed and agreed with the findings in the report. Ct Chest Wo Contrast  Result Date: 04/28/2018 CLINICAL DATA:  67 year old male with a history of stage IV non-small cell lung cancer with brain metastases currently undergoing palliative chemo radiation therapy. Patient additionally has chronic systolic heart failure with an ejection fraction of 25%, recent pulmonary embolus and now worsening dyspnea and productive cough. EXAM: CT CHEST WITHOUT CONTRAST TECHNIQUE: Multidetector CT imaging of the chest was performed following the standard protocol without  IV contrast. COMPARISON:  Most recent prior CT scan of the chest 03/02/2018 FINDINGS: Cardiovascular: Limited evaluation in the absence of intravenous contrast. Atherosclerotic calcifications visualized along the aorta. The heart is within normal limits for size. Calcifications present throughout the coronary arteries. No pericardial effusion. Mediastinum/Nodes: Limited evaluation in the absence of intravenous contrast. Diffuse mediastinal adenopathy is again noted. An index left paratracheal lymph node measures 1.3 cm in short axis. A juxta cardiac lymph node measures 1.2 cm in short axis. Unremarkable thoracic esophagus. Lungs/Pleura: Progressive patchy airspace opacity throughout the  right upper lung. The right middle and right lower lobes remain completely opacified. Scattered metastatic pulmonary nodules again noted throughout the left lung. Many of the nodules demonstrate slight interval enlargement consistent with disease progression. There is significant respiratory motion artifact. New small left pleural effusion and associated left lower lobe atelectasis. Persistent loculated right subpulmonic pleural effusion with a thick surrounding wall likely representing tumor. Upper Abdomen: No acute abnormality within the visualized upper abdomen. Small volume ascites. Musculoskeletal: Multifocal sclerotic osseous metastases. IMPRESSION: 1. Continued progression of stage IV metastatic lung cancer with multiple new blastic osseous metastases and worsening diffuse interstitial spread of disease throughout the right upper lobe. The right middle and lower lobes are completely replaced by tumor and volume loss. There is minimal residual aerated lung on the right. Numerous metastatic pulmonary nodules on the left are slightly enlarged. 2. New small left pleural effusion and associated left lower lobe atelectasis. 3. Stable entrapped right sub pulmonic pleural effusion versus central necrosis within tumor. 4. Additional ancillary findings as above without significant interval change. Electronically Signed   By: Jacqulynn Cadet M.D.   On: 04/28/2018 10:39   Dg Chest Port 1 View  Result Date: 04/27/2018 CLINICAL DATA:  Shortness of breath.  Right-sided lung cancer. EXAM: PORTABLE CHEST 1 VIEW COMPARISON:  03/02/2018 FINDINGS: Metastatic nodules remain evident throughout the left lung without gross progression. Left lung is overall well aerated in there is no left-sided effusion. Opacification of the right hemithorax is slightly progressive, with pleural and parenchymal opacity related to advanced carcinoma. No acute bone finding. IMPRESSION: Slight progressive opacity of the right hemithorax with  pleural and parenchymal density secondary to lung carcinoma. There is only minimal aeration in the right upper lobe. Metastatic nodules in the left lung appear similar to the previous study. Left lung is largely well aerated. Electronically Signed   By: Nelson Chimes M.D.   On: 04/27/2018 12:35

## 2018-04-29 NOTE — Consult Note (Signed)
Consultation Note Date: 04/29/2018   Patient Name: Bradley Maldonado  DOB: 1950/10/11  MRN: [id number]  Age / Sex: 67 y.o., male  PCP: [name], NP-C Referring Physician: Aileen Fass, Tammi Klippel, MD  Reason for Consultation: Establishing goals of care  HPI/Patient Profile: 67 y.o. male  with past medical history of stage IV non small cell lung cancer with brain mets, HF with EF 15%, PE admitted on 04/27/2018 with SOB and hypoxia.  Palliative met with him during prior admission.  Palliative consulted for goals of care.   Clinical Assessment and Goals of Care: Chart reviewed including any pertinent labs and imaging.  Discussed with bedside RN as well as Dr. Elsworth Soho and Dr. Venetia Constable.  I met today with Bradley Maldonado.  He was sitting in the bed with non-rebreather in place.    As soon as I sat down, he stated that he is "not talking about the future and what is going on anymore today.  I already told you guys what I want."  He reports being overwhelmed and that further discussion and repeatedly asking him about code status makes him nervous.    I attempted to get him to share more about his fears and concerns, and he stated that he was "just going to sit here and watch football."  SUMMARY OF RECOMMENDATIONS   - FULL CODE/FULL SCOPE - Declined to engage today other than to state he is clear is his desire for aggressive interventions and tired of being asked about it repeatedly.  Code Status/Advance Care Planning:  Full code  Prognosis:   Guarded  Discharge Planning: To Be Determined      Primary Diagnoses: Present on Admission: . (Resolved) Respiratory failure ([place]) . Hypertension . Brain metastases ([place])   I have reviewed the medical record, interviewed the patient and family, and examined the patient. The following aspects are pertinent.  Past Medical History:  Diagnosis Date  . Adenocarcinoma  of right lung, stage 4 ([place]) 01/24/2017  . Goals of care, counseling/discussion 01/24/2017  . Hypertension   . Respiratory failure ([place]) 04/27/2018   Social History   Socioeconomic History  . Marital status: Divorced    Spouse name: Not on file  . Number of children: Not on file  . Years of education: Not on file  . Highest education level: Not on file  Occupational History  . Not on file  Social Needs  . Financial resource strain: Not on file  . Food insecurity:    Worry: Not on file    Inability: Not on file  . Transportation needs:    Medical: Yes    Non-medical: Yes  Tobacco Use  . Smoking status: Former Smoker    Last attempt to quit: 01/21/2018    Years since quitting: 0.2  . Smokeless tobacco: Never Used  Substance and Sexual Activity  . Alcohol use: Yes    Alcohol/week: 14.0 standard drinks    Types: 14 Cans of beer per week    Comment: occasionally  now since cancer  diagnosis, he denies drinking alcohol in the last  6 days (03/07/18)  . Drug use: Yes    Types: Marijuana, Cocaine    Comment: using cocaine 1-2 times per week, he denies using  either drug "in a long time"   . Sexual activity: Not Currently  Lifestyle  . Physical activity:    Days per week: Not on file    Minutes per session: Not on file  . Stress: Not on file  Relationships  . Social connections:    Talks on phone: Not on file    Gets together: Not on file    Attends religious service: Not on file    Active member of club or organization: Not on file    Attends meetings of clubs or organizations: Not on file    Relationship status: Not on file  Other Topics Concern  . Not on file  Social History Narrative   [facility] (12/19/16):   No pets currently. No mold exposure. No recent travel.   Family History  Problem Relation Age of Onset  . Diabetes Mother   . Lung cancer Father   . Cancer Father   . Cancer - Lung Father    Scheduled Meds: . apixaban  5 mg Oral BID  . carvedilol  3.125  mg Oral BID WC  . chlorhexidine  15 mL Mouth Rinse BID  . ipratropium-albuterol  3 mL Nebulization Q6H  . mouth rinse  15 mL Mouth Rinse q12n4p  . polyethylene glycol  17 g Oral Daily  . potassium chloride  40 mEq Oral BID  . senna-docusate  1 tablet Oral QHS   Continuous Infusions: . sodium chloride Stopped (04/28/18 0005)  . piperacillin-tazobactam (ZOSYN)  IV 3.375 g (04/29/18 0845)   PRN Meds:.sodium chloride, acetaminophen **OR** acetaminophen, ipratropium-albuterol, ondansetron **OR** ondansetron (ZOFRAN) IV Medications Prior to Admission:  Prior to Admission medications   Medication Sig Start Date End Date Taking? Authorizing Provider  acetaminophen (TYLENOL) 650 MG CR tablet Take 650 mg by mouth every 8 (eight) hours as needed for pain.   Yes [provider]  carvedilol (COREG) 3.125 MG tablet Take 1 tablet (3.125 mg total) by mouth 2 (two) times daily with a meal. 03/05/18  Yes Lavina Hamman, MD  ELIQUIS STARTER PACK (ELIQUIS STARTER PACK) 5 MG TABS Take as directed on package: start with two-'5mg'$  tablets twice daily for 7 days. On day 8, switch to one-'5mg'$  tablet twice daily. Patient taking differently: Take 5 mg by mouth daily.  03/05/18  Yes Lavina Hamman, MD  polyethylene glycol [facility] / GLYCOLAX) packet Take 17 g by mouth daily. 03/06/18  Yes Lavina Hamman, MD  senna-docusate (SENOKOT-S) 8.6-50 MG tablet Take 1 tablet by mouth at bedtime. 03/05/18  Yes Lavina Hamman, MD  dexamethasone (DECADRON) 4 MG tablet Take 1 tab twice daily. On 11-14 reduce dose to one tab daily. On 11-21 reduce dose to 1/2 tab daily. Last dose on 12-1. Patient not taking: Reported on 04/27/2018 04/02/18   Eppie Gibson, MD  dexamethasone (DECADRON) 4 MG tablet 4 mg p.o. twice daily the day before, day of and day after chemotherapy every 3 weeks. Patient not taking: Reported on 04/27/2018 04/04/18   Curt Bears, MD  diazepam (VALIUM) 5 MG tablet Take 1 tablet PO 45 minutes before MRI,  and 45 minutes before mask fabrication, and 45 minutes before brain radiation therapy. Patient not taking: Reported on 04/04/2018 03/07/18   Eppie Gibson, MD  fluconazole (DIFLUCAN) 100  MG tablet Take 2 tablets today, then 1 tablet daily x 20 more days. Patient not taking: Reported on 04/27/2018 03/07/18   Eppie Gibson, MD  folic acid (FOLVITE) 1 MG tablet Take 1 tablet (1 mg total) by mouth daily. Patient not taking: Reported on 04/27/2018 04/04/18   Curt Bears, MD  LORazepam (ATIVAN) 1 MG tablet Take 1 tablet by mouth 20-30 min before radiation therapy. Patient not taking: Reported on 04/27/2018 03/21/18   Eppie Gibson, MD  losartan (COZAAR) 25 MG tablet Take 1 tablet (25 mg total) by mouth daily. Patient not taking: Reported on 04/27/2018 03/06/18   Lavina Hamman, MD  prochlorperazine (COMPAZINE) 10 MG tablet Take 1 tablet (10 mg total) by mouth every 6 (six) hours as needed for nausea or vomiting. Patient not taking: Reported on 04/27/2018 04/04/18   Curt Bears, MD   No Known Allergies Review of Systems  Constitutional: Positive for activity change.  Respiratory: Positive for shortness of breath.   Psychiatric/Behavioral: The patient is nervous/anxious.    Physical Exam General: Alert, awake, in no acute distress.  Mildly increased WOB. Frail and chronically ill appearing  HEENT: No bruits, no goiter, no JVD Heart: Tachy. No murmur appreciated. Lungs: Decreased air movement, clear Abdomen: Soft, nontender, nondistended, positive bowel sounds.  Ext: No significant edema Skin: Warm and dry Neuro: Grossly intact, nonfocal.   Vital Signs: BP 128/74   Pulse (!) 109   Temp 98.7 F (37.1 C) (Axillary)   Resp (!) 22   Ht '5\' 11"'$  (1.803 m)   Wt 67.8 kg   SpO2 (!) 87%   BMI 20.85 kg/m  Pain Scale: 0-10   Pain Score: 0-No pain   SpO2: SpO2: (!) 87 % O2 Device:SpO2: (!) 87 % O2 Flow Rate: .O2 Flow Rate (L/min): 15 L/min  IO: Intake/output summary:    Intake/Output Summary (Last 24 hours) at 04/29/2018 0902 Last data filed at 04/29/2018 0430 Gross per 24 hour  Intake 179.26 ml  Output 125 ml  Net 54.26 ml    LBM: Last BM Date: 04/27/18 Baseline Weight: Weight: 68.9 kg Most recent weight: Weight: 67.8 kg     Palliative Assessment/Data:   Flowsheet Rows     Most Recent Value  Intake Tab  Referral Department  Hospitalist  Unit at Time of Referral  ICU  Palliative Care Primary Diagnosis  Cancer  Date Notified  04/28/18  Palliative Care Type  Return patient Palliative Care  Reason for referral  Clarify Goals of Care  Date of Admission  04/27/18  Date first seen by Palliative Care  04/28/18  # of days Palliative referral response time  0 Day(s)  # of days IP prior to Palliative referral  1  Clinical Assessment  Palliative Performance Scale Score  50%  Pain Max last 24 hours  0  Pain Min Last 24 hours  0  Dyspnea Max Last 24 Hours  8  Dyspnea Min Last 24 hours  3  Psychosocial & Spiritual Assessment  Palliative Care Outcomes  Patient/Family meeting held?  Yes  Who was at the meeting?  patient  Palliative Care Outcomes  Clarified goals of care      Time In: 1740 Time Out: 1830 Time Total: 50 Greater than 50%  of this time was spent counseling and coordinating care related to the above assessment and plan.  Signed by: Micheline Rough, MD   Please contact Palliative Medicine Team phone at [phone] for questions and concerns.  For individual provider: See Shea Evans

## 2018-04-29 NOTE — Progress Notes (Signed)
[facility] Progress Note Patient Name: Bradley Maldonado DOB: Mar 14, 1951 MRN: [id number]   Date of Service  04/29/2018  HPI/Events of Note  K+ = 3.4 and Creatinine = 0.74.   eICU Interventions  Will replace K+.       Intervention Category Major Interventions: Electrolyte abnormality - evaluation and management  Kahleb Mcclane Eugene 04/29/2018, 5:45 AM

## 2018-04-29 NOTE — Progress Notes (Signed)
Name: Bradley Maldonado MRN: [id number] DOB: 01-Aug-1950    ADMISSION DATE:  04/27/2018 CONSULTATION DATE:  04/29/2018  REFERRING MD :  Aileen Fass  CHIEF COMPLAINT: Respiratory failure  BRIEF PATIENT DESCRIPTION: 67 year old with advanced metastatic lung cancer and chronic right-sided atelectasis with acute respiratory failure requiring BiPAP  SIGNIFICANT EVENTS  12/6 required nonrebreather 12/7 placed on BiPAP after return from CT scan  STUDIES:  12/7 CT chest without contrast-progression of tumor with worsening bone mets, extensive tumor in the right middle and lower lobe consolidation, subpulmonic effusion, small left effusion and left lower lobe atelectasis  SUBJECTIVE:  Transitioned off BiPAP to nonrebreather this morning and seems to be maintaining Does not appear to be in distress. Able to speak in full sentences Denies chest pain  VITAL SIGNS: Temp:  [98 F (36.7 C)-99.1 F (37.3 C)] 98.7 F (37.1 C) (12/08 0409) Pulse Rate:  [91-124] 91 (12/08 1200) Resp:  [15-28] 20 (12/08 1200) BP: (94-167)/(54-87) 111/66 (12/08 1200) SpO2:  [87 %-97 %] 93 % (12/08 1200) FiO2 (%):  [100 %] 100 % (12/08 0717)  PHYSICAL EXAMINATION: General: Cachectic man, sitting up in bed, on NRB, no distress Neuro: Alert and able to speak in full sentences through the mask, nonfocal HEENT: Mild pallor, no icterus Cardiovascular: S1-S2 mild tacky, no murmur Lungs: Decreased breath sounds on right, clear on left, no rhonchi Abdomen: Soft, nontender, scaphoid Musculoskeletal: No deformity Skin: No rash, no edema  Recent Labs  Lab 04/27/18 1305 04/28/18 0315 04/29/18 0352  NA 142 140 140  K 3.4* 3.6 3.4*  CL 100 100 99  CO2 32 30 34*  BUN 7* 6* 7*  CREATININE 0.63 0.72 0.74  GLUCOSE 135* 100* 106*   Recent Labs  Lab 04/27/18 1305 04/28/18 0315 04/29/18 0352  HGB 13.0 11.9* 11.7*  HCT 42.2 38.6* 39.4  WBC 6.6 5.3 5.6  PLT 152 139* 153   Ct Chest Wo Contrast  Result Date:  04/28/2018 CLINICAL DATA:  67 year old male with a history of stage IV non-small cell lung cancer with brain metastases currently undergoing palliative chemo radiation therapy. Patient additionally has chronic systolic heart failure with an ejection fraction of 25%, recent pulmonary embolus and now worsening dyspnea and productive cough. EXAM: CT CHEST WITHOUT CONTRAST TECHNIQUE: Multidetector CT imaging of the chest was performed following the standard protocol without IV contrast. COMPARISON:  Most recent prior CT scan of the chest 03/02/2018 FINDINGS: Cardiovascular: Limited evaluation in the absence of intravenous contrast. Atherosclerotic calcifications visualized along the aorta. The heart is within normal limits for size. Calcifications present throughout the coronary arteries. No pericardial effusion. Mediastinum/Nodes: Limited evaluation in the absence of intravenous contrast. Diffuse mediastinal adenopathy is again noted. An index left paratracheal lymph node measures 1.3 cm in short axis. A juxta cardiac lymph node measures 1.2 cm in short axis. Unremarkable thoracic esophagus. Lungs/Pleura: Progressive patchy airspace opacity throughout the right upper lung. The right middle and right lower lobes remain completely opacified. Scattered metastatic pulmonary nodules again noted throughout the left lung. Many of the nodules demonstrate slight interval enlargement consistent with disease progression. There is significant respiratory motion artifact. New small left pleural effusion and associated left lower lobe atelectasis. Persistent loculated right subpulmonic pleural effusion with a thick surrounding wall likely representing tumor. Upper Abdomen: No acute abnormality within the visualized upper abdomen. Small volume ascites. Musculoskeletal: Multifocal sclerotic osseous metastases. IMPRESSION: 1. Continued progression of stage IV metastatic lung cancer with multiple new blastic osseous metastases and  worsening  diffuse interstitial spread of disease throughout the right upper lobe. The right middle and lower lobes are completely replaced by tumor and volume loss. There is minimal residual aerated lung on the right. Numerous metastatic pulmonary nodules on the left are slightly enlarged. 2. New small left pleural effusion and associated left lower lobe atelectasis. 3. Stable entrapped right sub pulmonic pleural effusion versus central necrosis within tumor. 4. Additional ancillary findings as above without significant interval change. Electronically Signed   By: Jacqulynn Cadet M.D.   On: 04/28/2018 10:39    ASSESSMENT / PLAN:  Acute respiratory failure with hypoxia Metastatic adenocarcinoma lung to brain and bone  Chronic right atelectasis Recent pulmonary embolism  -He clearly has cancer progression.  He has chronic atelectasis of his right lung for at least the past 2 months and now also has some left lung involvement.  I am not surprised that he is needing high flow oxygen  -I once again explained to him that mechanical ventilation would likely involve more suffering than benefit.  He did not want to continue this line of discussion because he felt that it increased his anxiety.  For now I am happy that he has transitioned off BiPAP and this hopefully gives this more time to have palliative conversations  Kara Mead MD. Shade Flood. [facility] Pager [phone] If no response call [phone]    04/29/2018, 12:47 PM

## 2018-04-29 NOTE — Progress Notes (Signed)
[facility] PROGRESS NOTE    Progress Note  Bradley Maldonado  HGD:[id number] DOB: 1951/03/15 DOA: 04/27/2018 PCP: [name], NP-C     Brief Narrative:   Bradley Maldonado is an 67 y.o. male past medical history significant of stage IV non-small cell lung cancer with brain metastases treated with palliative chemo and radiation, chronic systolic heart failure with an EF of 25% and pulmonary embolism comes in for worsening dyspnea, the patient has been bedridden due to severe dyspnea, accompanied by congestion and progressive productive cough on the day of admission he went to see the oncologist and found saturations at 80% so he was evaluated for further evaluation  Assessment/Plan:   Acute on chronic respiratory failure with hypoxia likely diffuse interstitial spread of non-small cell carcinoma He was started empirically on IV antibiotics, he has remained afebrile, with no leukocytosis. CT chest showed progressive infiltrated malignancy. I have talked to him about hospice and he continues to be a full code with aggressive measures.  Despite me telling him that his prognosis is very poor. To keep him on the dry side to help with oxygenation. He becomes tachycardic every time he speaks remove.  On a non-rebreather. Critical situation with a tenuous patient with unrealistic expectation.  Chronic systolic heart failure: Continue Coreg and losartan. BNP of 37 is unlikely he is fluid overloaded.  Essential hypertension: Continue Coreg blood pressure has been stable.  Stage IV non-small cell lung cancer/  Brain metastases [facility]): Currently on immunotherapy Continue Decadron.  Will contact hospice and palliative care. Consulted oncology.  History of pulmonary embolism (HCC) Continue Eliquis.  RN Pressure Injury Documentation:   Severe protein caloric malnutrition: Start Ensure he is cachectic Estimated body mass index is 20.85 kg/m as calculated from the following:  Height as of this encounter: 5\' 11"  (1.803 m).   Weight as of this encounter: 67.8 kg.  DVT prophylaxis: eliquis Family Communication:none Disposition Plan/Barrier to D/C: once breathing improved Code Status:     Code Status Orders  (From admission, onward)         Start     Ordered   04/27/18 1614  Full code  Continuous     04/27/18 1613        Code Status History    Date Active Date Inactive Code Status Order ID Comments User Context   03/01/2018 1559 03/06/2018 1510 Full Code [id number]  [name], MD Inpatient   12/17/2016 1707 12/21/2016 2036 Full Code [id number]  Shela Leff, MD ED        IV Access:    Peripheral IV   Procedures and diagnostic studies:   Ct Chest Wo Contrast  Result Date: 04/28/2018 CLINICAL DATA:  67 year old male with a history of stage IV non-small cell lung cancer with brain metastases currently undergoing palliative chemo radiation therapy. Patient additionally has chronic systolic heart failure with an ejection fraction of 25%, recent pulmonary embolus and now worsening dyspnea and productive cough. EXAM: CT CHEST WITHOUT CONTRAST TECHNIQUE: Multidetector CT imaging of the chest was performed following the standard protocol without IV contrast. COMPARISON:  Most recent prior CT scan of the chest 03/02/2018 FINDINGS: Cardiovascular: Limited evaluation in the absence of intravenous contrast. Atherosclerotic calcifications visualized along the aorta. The heart is within normal limits for size. Calcifications present throughout the coronary arteries. No pericardial effusion. Mediastinum/Nodes: Limited evaluation in the absence of intravenous contrast. Diffuse mediastinal adenopathy is again noted. An index left paratracheal lymph node measures 1.3 cm in short axis.  A juxta cardiac lymph node measures 1.2 cm in short axis. Unremarkable thoracic esophagus. Lungs/Pleura: Progressive patchy airspace opacity throughout the right upper lung. The right  middle and right lower lobes remain completely opacified. Scattered metastatic pulmonary nodules again noted throughout the left lung. Many of the nodules demonstrate slight interval enlargement consistent with disease progression. There is significant respiratory motion artifact. New small left pleural effusion and associated left lower lobe atelectasis. Persistent loculated right subpulmonic pleural effusion with a thick surrounding wall likely representing tumor. Upper Abdomen: No acute abnormality within the visualized upper abdomen. Small volume ascites. Musculoskeletal: Multifocal sclerotic osseous metastases. IMPRESSION: 1. Continued progression of stage IV metastatic lung cancer with multiple new blastic osseous metastases and worsening diffuse interstitial spread of disease throughout the right upper lobe. The right middle and lower lobes are completely replaced by tumor and volume loss. There is minimal residual aerated lung on the right. Numerous metastatic pulmonary nodules on the left are slightly enlarged. 2. New small left pleural effusion and associated left lower lobe atelectasis. 3. Stable entrapped right sub pulmonic pleural effusion versus central necrosis within tumor. 4. Additional ancillary findings as above without significant interval change. Electronically Signed   By: Jacqulynn Cadet M.D.   On: 04/28/2018 10:39   Dg Chest Port 1 View  Result Date: 04/27/2018 CLINICAL DATA:  Shortness of breath.  Right-sided lung cancer. EXAM: PORTABLE CHEST 1 VIEW COMPARISON:  03/02/2018 FINDINGS: Metastatic nodules remain evident throughout the left lung without gross progression. Left lung is overall well aerated in there is no left-sided effusion. Opacification of the right hemithorax is slightly progressive, with pleural and parenchymal opacity related to advanced carcinoma. No acute bone finding. IMPRESSION: Slight progressive opacity of the right hemithorax with pleural and parenchymal density  secondary to lung carcinoma. There is only minimal aeration in the right upper lobe. Metastatic nodules in the left lung appear similar to the previous study. Left lung is largely well aerated. Electronically Signed   By: Nelson Chimes M.D.   On: 04/27/2018 12:35     Medical Consultants:    None.  Anti-Infectives:   IV zosyn  Subjective:    Bradley Maldonado he relates his breathing is about the same as yesterday, but not improved.  He continues to struggle to breathe on 100% nonrebreather becoming tachycardic every time he speaks  Objective:    Vitals:   04/29/18 0400 04/29/18 0409 04/29/18 0700 04/29/18 0717  BP: 108/61  102/60   Pulse: 92  94   Resp: 15  20   Temp:  98.7 F (37.1 C)    TempSrc:  Axillary    SpO2: 94%  92% 93%  Weight:      Height:        Intake/Output Summary (Last 24 hours) at 04/29/2018 0727 Last data filed at 04/29/2018 0430 Gross per 24 hour  Intake 201.81 ml  Output 125 ml  Net 76.81 ml   Filed Weights   04/27/18 1212 04/27/18 1616  Weight: 68.9 kg 67.8 kg    Exam: General exam: In no acute distress, cachectic Respiratory system: Good air movement and clear to auscultation. Cardiovascular system: S1 & S2 heard, RRR. No JVD. Gastrointestinal system: Abdomen is nondistended, soft and nontender.  Central nervous system: Alert and oriented. No focal neurological deficits. Extremities: No pedal edema. Skin: No rashes, lesions or ulcers Psychiatry: Judgement and insight appear normal. Mood & affect appropriate.    Data Reviewed:    Labs: Basic Metabolic Panel: Recent  Labs  Lab 04/27/18 1305 04/28/18 0315 04/29/18 0352  NA 142 140 140  K 3.4* 3.6 3.4*  CL 100 100 99  CO2 32 30 34*  GLUCOSE 135* 100* 106*  BUN 7* 6* 7*  CREATININE 0.63 0.72 0.74  CALCIUM 8.4* 7.9* 7.9*   GFR Estimated Creatinine Clearance: 84.8 mL/min (by C-G formula based on SCr of 0.74 mg/dL). Liver Function Tests: No results for input(s): AST, ALT,  ALKPHOS, BILITOT, PROT, ALBUMIN in the last 168 hours. No results for input(s): LIPASE, AMYLASE in the last 168 hours. No results for input(s): AMMONIA in the last 168 hours. Coagulation profile No results for input(s): INR, PROTIME in the last 168 hours.  CBC: Recent Labs  Lab 04/27/18 1305 04/28/18 0315 04/29/18 0352  WBC 6.6 5.3 5.6  NEUTROABS 5.1  --   --   HGB 13.0 11.9* 11.7*  HCT 42.2 38.6* 39.4  MCV 92.5 93.0 92.3  PLT 152 139* 153   Cardiac Enzymes: No results for input(s): CKTOTAL, CKMB, CKMBINDEX, TROPONINI in the last 168 hours. BNP (last 3 results) No results for input(s): PROBNP in the last 8760 hours. CBG: No results for input(s): GLUCAP in the last 168 hours. D-Dimer: No results for input(s): DDIMER in the last 72 hours. Hgb A1c: No results for input(s): HGBA1C in the last 72 hours. Lipid Profile: No results for input(s): CHOL, HDL, LDLCALC, TRIG, CHOLHDL, LDLDIRECT in the last 72 hours. Thyroid function studies: No results for input(s): TSH, T4TOTAL, T3FREE, THYROIDAB in the last 72 hours.  Invalid input(s): FREET3 Anemia work up: No results for input(s): VITAMINB12, FOLATE, FERRITIN, TIBC, IRON, RETICCTPCT in the last 72 hours. Sepsis Labs: Recent Labs  Lab 04/27/18 1257 04/27/18 1305 04/27/18 1539 04/28/18 0315 04/29/18 0352  WBC  --  6.6  --  5.3 5.6  LATICACIDVEN 2.01*  --  1.36  --   --    Microbiology Recent Results (from the past 240 hour(s))  Culture, blood (routine x 2)     Status: None (Preliminary result)   Collection Time: 04/27/18  1:05 PM  Result Value Ref Range Status   Specimen Description   Final    BLOOD LEFT FOREARM Performed at [facility], [place] [address], [place], [place] [address]    Special Requests   Final    BOTTLES DRAWN AEROBIC AND ANAEROBIC Blood Culture adequate volume Performed at [facility] [address] [place], [place] [address]    Culture   Final    NO GROWTH <  24 HOURS Performed at [facility] [address] [place], [place] [address]    Report Status PENDING  Incomplete  Culture, blood (routine x 2)     Status: None (Preliminary result)   Collection Time: 04/27/18  4:42 PM  Result Value Ref Range Status   Specimen Description   Final    BLOOD RIGHT ARM Performed at [facility] [address] [place], [place] [address]    Special Requests   Final    BOTTLES DRAWN AEROBIC ONLY Blood Culture adequate volume Performed at [facility] [address] [place], [place] [address]    Culture   Final    NO GROWTH < 24 HOURS Performed at [facility] [address], [place], [place] [address]    Report Status PENDING  Incomplete     Medications:   . apixaban  5 mg Oral BID  . carvedilol  3.125  mg Oral BID WC  . ipratropium-albuterol  3 mL Nebulization Q6H  . mouth rinse  15 mL Mouth Rinse BID  . polyethylene glycol  17 g Oral Daily  . potassium chloride  40 mEq Oral Once  . senna-docusate  1 tablet Oral QHS   Continuous Infusions: . sodium chloride Stopped (04/28/18 0005)  . piperacillin-tazobactam (ZOSYN)  IV Stopped (04/29/18 0430)      LOS: 2 days   Charlynne Cousins  [facility]   *Please refer to [email or web address], password [email or web address] to get updated schedule on who will round on this patient, as hospitalists switch teams weekly. If 7PM-7AM, please contact night-coverage at [email or web address], password [email or web address] for any overnight needs.  04/29/2018, 7:27 AM

## 2018-04-29 NOTE — Progress Notes (Signed)
Daily Progress Note   Patient Name: Bradley Maldonado       Date: 04/29/2018 DOB: 11-23-1950  Age: 67 y.o. MRN#: [id number] Attending Physician: Charlynne Cousins, MD Primary Care Physician: [name], NP-C Admit Date: 04/27/2018  Reason for Consultation/Follow-up: Establishing goals of care  Subjective: I met with Bradley Maldonado today.  [name] lying in bed with NRB in place.    He became upset and any mention of his medical condition and stated again that his is "not going to talk anymore about any of this today.  Why can't people just leave it alone for a minute?"  I then attempted to discuss who we should be talking to in the event he is unable to make his own decisions.  In prior palliative care encounter, it appears that he had stated that his aunt and his oldest daughter (chart reports 3 daughters total but he is not close to two of them) ought to be his surrogates if he cannot speak for himself.  He confirmed this to me today but reports that no paperwork has been completed.  He stated "fine" when I inquired about spiritual care visiting him tomorrow to discuss HCPOA documentation.  Length of Stay: 2  Current Medications: Scheduled Meds:  . apixaban  5 mg Oral BID  . carvedilol  3.125 mg Oral BID WC  . chlorhexidine  15 mL Mouth Rinse BID  . dexamethasone  12 mg Intravenous Q12H  . ipratropium-albuterol  3 mL Nebulization Q6H  . mouth rinse  15 mL Mouth Rinse q12n4p  . polyethylene glycol  17 g Oral Daily  . potassium chloride  40 mEq Oral BID  . senna-docusate  1 tablet Oral QHS    Continuous Infusions: . sodium chloride Stopped (04/28/18 0005)  . piperacillin-tazobactam (ZOSYN)  IV 3.375 g (04/29/18 0845)    PRN Meds: sodium chloride, acetaminophen **OR**  acetaminophen, ipratropium-albuterol, ondansetron **OR** ondansetron (ZOFRAN) IV  Physical Exam         General: Alert, awake, in no acute distress.  Mildly increased WOB. Frail and chronically ill appearing  HEENT: No bruits, no goiter, no JVD Heart: Tachy. No murmur appreciated. Lungs: Decreased air movement, clear Abdomen: Soft, nontender, nondistended, positive bowel sounds.  Ext: No significant edema Skin: Warm and dry Neuro: Grossly intact, nonfocal.  Vital Signs: BP 111/66   Pulse 91   Temp 98.7 F (37.1 C) (Axillary)   Resp 20   Ht _0  (1.803 m)   Wt 67.8 kg   SpO2 93%   BMI 20.85 kg/m  SpO2: SpO2: 93 % O2 Device: O2 Device: NRB O2 Flow Rate: O2 Flow Rate (L/min): 15 L/min  Intake/output summary:   Intake/Output Summary (Last 24 hours) at 04/29/2018 1219 Last data filed at 04/29/2018 0900 Gross per 24 hour  Intake 369.26 ml  Output 125 ml  Net 244.26 ml   LBM: Last BM Date: 04/27/18 Baseline Weight: Weight: 68.9 kg Most recent weight: Weight: 67.8 kg       Palliative Assessment/Data:    Flowsheet Rows     Most Recent Value  Intake Tab  Referral Department  Hospitalist  Unit at Time of Referral  ICU  Palliative Care Primary Diagnosis  Cancer  Date Notified  04/28/18  Palliative Care Type  Return patient Palliative Care  Reason for referral  Clarify Goals of Care  Date of Admission  04/27/18  Date first seen by Palliative Care  04/28/18  # of days Palliative referral response time  0 Day(s)  # of days IP prior to Palliative referral  1  Clinical Assessment  Palliative Performance Scale Score  50%  Pain Max last 24 hours  0  Pain Min Last 24 hours  0  Dyspnea Max Last 24 Hours  8  Dyspnea Min Last 24 hours  3  Psychosocial & Spiritual Assessment  Palliative Care Outcomes  Patient/Family meeting held?  Yes  Who was at the meeting?  patient  Palliative Care Outcomes  Clarified goals of care      Patient Active Problem List   Diagnosis Date  Noted  . Acute on chronic respiratory failure with hypoxia ([place]) 04/28/2018  . Lobar pneumonia ([place]) 04/28/2018  . Chronic systolic CHF (congestive heart failure) ([place]) 04/28/2018  . History of pulmonary embolism ([place]) 04/28/2018  . Encounter for antineoplastic chemotherapy 04/04/2018  . Brain metastases ([place]) 03/14/2018  . Acute combined systolic and diastolic heart failure ([place]) 03/05/2018  . Recurrent right pleural effusion 03/01/2018  . Encounter for antineoplastic immunotherapy 02/15/2017  . Adenocarcinoma of right lung, stage 4 ([place]) 01/24/2017  . Goals of care, counseling/discussion 01/24/2017  . Status post thoracentesis   . Chronic obstructive pulmonary disease ([place])   . Lung cancer ([place]) 12/20/2016  . Pleural effusion, malignant 12/17/2016  . Hypertension 12/17/2016  . History of right hip replacement 12/17/2016    Palliative Care Assessment & Plan   Patient Profile: 67 y.o. male  with past medical history of stage IV non small cell lung cancer with brain mets, HF with EF 15%, PE admitted on 04/27/2018 with SOB and hypoxia.  Palliative met with him during prior admission.  Palliative consulted for goals of care.   Recommendations/Plan: - FULL CODE/FULL SCOPE - Declined to engage today other than to state he is clear is his desire for aggressive interventions and tired of being asked about it repeatedly. - Reports that his aunt and daughter would be his preferred surrogate decision makers.  Will ask spiritual care to f/u on this tomorrow. - PMT will continue to follow and progress conversation as patient is emotionally willing/able to do so.   Goals of Care and Additional Recommendations:  Limitations on Scope of Treatment: Full Scope Treatment  Code Status:    Code Status Orders  (From admission, onward)  Start     Ordered   04/27/18 1614  Full code  Continuous     04/27/18 1613        Code Status History    Date Active Date Inactive Code Status Order  ID Comments User Context   03/01/2018 1559 03/06/2018 1510 Full Code [id number]  [name], MD Inpatient   12/17/2016 1707 12/21/2016 2036 Full Code [id number]  Shela Leff, MD ED       Prognosis:   Guarded  Discharge Planning:  To Be Determined  Care plan was discussed with Patient, RN  Thank you for allowing the Palliative Medicine Team to assist in the care of this patient.   Total Time 25 Prolonged Time Billed No      Greater than 50%  of this time was spent counseling and coordinating care related to the above assessment and plan.  Micheline Rough, MD  Please contact Palliative Medicine Team phone at [phone] for questions and concerns.

## 2018-04-30 DIAGNOSIS — I2699 Other pulmonary embolism without acute cor pulmonale: Secondary | ICD-10-CM

## 2018-04-30 DIAGNOSIS — I5022 Chronic systolic (congestive) heart failure: Secondary | ICD-10-CM

## 2018-04-30 DIAGNOSIS — C7931 Secondary malignant neoplasm of brain: Secondary | ICD-10-CM

## 2018-04-30 DIAGNOSIS — R0602 Shortness of breath: Secondary | ICD-10-CM

## 2018-04-30 LAB — BASIC METABOLIC PANEL
Anion gap: 11 (ref 5–15)
BUN: 10 mg/dL (ref 8–23)
CO2: 29 mmol/L (ref 22–32)
Calcium: 8.4 mg/dL — ABNORMAL LOW (ref 8.9–10.3)
Chloride: 101 mmol/L (ref 98–111)
Creatinine, Ser: 0.61 mg/dL (ref 0.61–1.24)
GFR calc Af Amer: >60 mL/min (ref >60)
GFR calc non Af Amer: >60 mL/min (ref >60)
Glucose, Bld: 150 mg/dL — ABNORMAL HIGH (ref 70–99)
Potassium: 4.5 mmol/L (ref 3.5–5.1)
Sodium: 141 mmol/L (ref 135–145)

## 2018-04-30 MED ORDER — SALINE SPRAY 0.65 % NA SOLN
1.0000 | NASAL | Status: DC | PRN
  Administered 2018-04-30: 1 via NASAL
  Filled 2018-04-30: qty 44

## 2018-04-30 MED ORDER — SALINE SPRAY 0.65 % NA SOLN
2.0000 | Freq: Four times a day (QID) | NASAL | Status: DC
  Administered 2018-04-30 – 2018-05-15 (×25): 2 via NASAL
  Filled 2018-04-30: qty 44

## 2018-04-30 NOTE — Progress Notes (Signed)
   04/30/18 1546  Clinical Encounter Type  Visited With Patient and family together  Visit Type Initial  Referral From Palliative care team  Consult/Referral To Chaplain  The chaplain responded to request for consult from PMT-GF.  The chaplain was able to enter the room with a brief introduction. The Pt. sister was present at the time of visit and the Pt. Was about to eat his lunch.  The Pt politely agreed to a F/U visit with the chaplain on Wednesday afternoon.

## 2018-04-30 NOTE — Progress Notes (Signed)
[facility] PROGRESS NOTE    Progress Note  Bradley Maldonado  NFA:[id number] DOB: 1950/08/17 DOA: 04/27/2018 PCP: [name], NP-C     Brief Narrative:   Bradley Maldonado is an 67 y.o. male past medical history significant of stage IV non-small cell lung cancer with brain metastases treated with palliative chemo and radiation, chronic systolic heart failure with an EF of 25% and pulmonary embolism comes in for worsening dyspnea, the patient has been bedridden due to severe dyspnea, accompanied by congestion and progressive productive cough on the day of admission he went to see the oncologist and found saturations at 80% so he was evaluated for further evaluation  Assessment/Plan:   Acute on chronic respiratory failure with hypoxia likely diffuse interstitial spread of non-small cell carcinoma CT chest showed progressive diffuse infiltrated malignancy. I have talked to him about hospice and he continues to be a full code with aggressive measures.  Despite me telling him that his prognosis is very poor, he wants aggressive treatment even if that means intubation and never coming off the ventilator. Oncology and pulmonary and critical care were consulted which agreed with management, they recommended started IV steroids. Critical situation with a tenuous patient with unrealistic expectation. I think it is futile to intubate this patient as the main culprit of his respiratory failure cannot be treated.  I will discuss with critical care.   Will try to put him on a nonrebreather as he relates to the nasal cannula is drying his nose.  Had an episode of epistaxis overnight which is now resolved.  Chronic systolic heart failure: Continue Coreg and losartan. Try to continue to keep him on the dry side  Essential hypertension: Continue Coreg blood pressure has been stable.  Stage IV non-small cell lung cancer/  Brain metastases [facility]): Currently on immunotherapy ~Oncology they  recommended to increase the IV Decadron.  History of pulmonary embolism (HCC) Continue Eliquis.  Constipation We will give him a tap water enema.  RN Pressure Injury Documentation:   Severe protein caloric malnutrition: Start Ensure he is cachectic Estimated body mass index is 20.85 kg/m as calculated from the following:   Height as of this encounter: 5\' 11"  (1.803 m).   Weight as of this encounter: 67.8 kg.  DVT prophylaxis: eliquis Family Communication:none Disposition Plan/Barrier to D/C: once breathing improved Code Status:     Code Status Orders  (From admission, onward)         Start     Ordered   04/27/18 1614  Full code  Continuous     04/27/18 1613        Code Status History    Date Active Date Inactive Code Status Order ID Comments User Context   03/01/2018 1559 03/06/2018 1510 Full Code [id number]  [name], MD Inpatient   12/17/2016 1707 12/21/2016 2036 Full Code [id number]  Shela Leff, MD ED        IV Access:    Peripheral IV   Procedures and diagnostic studies:   Ct Chest Wo Contrast  Result Date: 04/28/2018 CLINICAL DATA:  67 year old male with a history of stage IV non-small cell lung cancer with brain metastases currently undergoing palliative chemo radiation therapy. Patient additionally has chronic systolic heart failure with an ejection fraction of 25%, recent pulmonary embolus and now worsening dyspnea and productive cough. EXAM: CT CHEST WITHOUT CONTRAST TECHNIQUE: Multidetector CT imaging of the chest was performed following the standard protocol without IV contrast. COMPARISON:  Most recent prior CT  scan of the chest 03/02/2018 FINDINGS: Cardiovascular: Limited evaluation in the absence of intravenous contrast. Atherosclerotic calcifications visualized along the aorta. The heart is within normal limits for size. Calcifications present throughout the coronary arteries. No pericardial effusion. Mediastinum/Nodes: Limited evaluation in  the absence of intravenous contrast. Diffuse mediastinal adenopathy is again noted. An index left paratracheal lymph node measures 1.3 cm in short axis. A juxta cardiac lymph node measures 1.2 cm in short axis. Unremarkable thoracic esophagus. Lungs/Pleura: Progressive patchy airspace opacity throughout the right upper lung. The right middle and right lower lobes remain completely opacified. Scattered metastatic pulmonary nodules again noted throughout the left lung. Many of the nodules demonstrate slight interval enlargement consistent with disease progression. There is significant respiratory motion artifact. New small left pleural effusion and associated left lower lobe atelectasis. Persistent loculated right subpulmonic pleural effusion with a thick surrounding wall likely representing tumor. Upper Abdomen: No acute abnormality within the visualized upper abdomen. Small volume ascites. Musculoskeletal: Multifocal sclerotic osseous metastases. IMPRESSION: 1. Continued progression of stage IV metastatic lung cancer with multiple new blastic osseous metastases and worsening diffuse interstitial spread of disease throughout the right upper lobe. The right middle and lower lobes are completely replaced by tumor and volume loss. There is minimal residual aerated lung on the right. Numerous metastatic pulmonary nodules on the left are slightly enlarged. 2. New small left pleural effusion and associated left lower lobe atelectasis. 3. Stable entrapped right sub pulmonic pleural effusion versus central necrosis within tumor. 4. Additional ancillary findings as above without significant interval change. Electronically Signed   By: Jacqulynn Cadet M.D.   On: 04/28/2018 10:39     Medical Consultants:    None.  Anti-Infectives:   IV zosyn  Subjective:    [name] he relates his breathing is slightly worse compared to yesterday, he has not had a bowel movement.  He cannot speak in full  sentences.  Objective:    Vitals:   04/29/18 2300 04/29/18 2326 04/30/18 0000 04/30/18 0127  BP: (!) 143/83 (!) 143/83 (!) 134/92   Pulse: 100 (!) 108 100   Resp: (!) 21 (!) 26 (!) 23   Temp:      TempSrc:      SpO2: (!) 89% 92% 96% 93%  Weight:      Height:        Intake/Output Summary (Last 24 hours) at 04/30/2018 0716 Last data filed at 04/30/2018 0100 Gross per 24 hour  Intake 339.25 ml  Output 530 ml  Net -190.75 ml   Filed Weights   04/27/18 1212 04/27/18 1616  Weight: 68.9 kg 67.8 kg    Exam: General exam: In no acute distress, cachectic Respiratory system: Moderate air movement and clear as he is tachypneic Cardiovascular system: S1 & S2 heard, RRR. No JVD. Gastrointestinal system: Abdomen is nondistended, soft and nontender.  Central nervous system: Alert and oriented. No focal neurological deficits. Extremities: No pedal edema. Skin: No rashes, lesions or ulcers Psychiatry: Judgement and insight appear normal. Mood & affect appropriate.    Data Reviewed:    Labs: Basic Metabolic Panel: Recent Labs  Lab 04/27/18 1305 04/28/18 0315 04/29/18 0352 04/30/18 0340  NA 142 140 140 141  K 3.4* 3.6 3.4* 4.5  CL 100 100 99 101  CO2 32 30 34* 29  GLUCOSE 135* 100* 106* 150*  BUN 7* 6* 7* 10  CREATININE 0.63 0.72 0.74 0.61  CALCIUM 8.4* 7.9* 7.9* 8.4*   GFR Estimated Creatinine Clearance: 84.8  mL/min (by C-G formula based on SCr of 0.61 mg/dL). Liver Function Tests: No results for input(s): AST, ALT, ALKPHOS, BILITOT, PROT, ALBUMIN in the last 168 hours. No results for input(s): LIPASE, AMYLASE in the last 168 hours. No results for input(s): AMMONIA in the last 168 hours. Coagulation profile No results for input(s): INR, PROTIME in the last 168 hours.  CBC: Recent Labs  Lab 04/27/18 1305 04/28/18 0315 04/29/18 0352  WBC 6.6 5.3 5.6  NEUTROABS 5.1  --   --   HGB 13.0 11.9* 11.7*  HCT 42.2 38.6* 39.4  MCV 92.5 93.0 92.3  PLT 152 139* 153    Cardiac Enzymes: No results for input(s): CKTOTAL, CKMB, CKMBINDEX, TROPONINI in the last 168 hours. BNP (last 3 results) No results for input(s): PROBNP in the last 8760 hours. CBG: Recent Labs  Lab 04/29/18 0747 04/29/18 1201  GLUCAP 92 102*   D-Dimer: No results for input(s): DDIMER in the last 72 hours. Hgb A1c: No results for input(s): HGBA1C in the last 72 hours. Lipid Profile: No results for input(s): CHOL, HDL, LDLCALC, TRIG, CHOLHDL, LDLDIRECT in the last 72 hours. Thyroid function studies: No results for input(s): TSH, T4TOTAL, T3FREE, THYROIDAB in the last 72 hours.  Invalid input(s): FREET3 Anemia work up: No results for input(s): VITAMINB12, FOLATE, FERRITIN, TIBC, IRON, RETICCTPCT in the last 72 hours. Sepsis Labs: Recent Labs  Lab 04/27/18 1257 04/27/18 1305 04/27/18 1539 04/28/18 0315 04/29/18 0352  WBC  --  6.6  --  5.3 5.6  LATICACIDVEN 2.01*  --  1.36  --   --    Microbiology Recent Results (from the past 240 hour(s))  Culture, blood (routine x 2)     Status: None (Preliminary result)   Collection Time: 04/27/18  1:05 PM  Result Value Ref Range Status   Specimen Description   Final    BLOOD LEFT FOREARM Performed at [facility], [place] [address] [place], [place] [address]    Special Requests   Final    BOTTLES DRAWN AEROBIC AND ANAEROBIC Blood Culture adequate volume Performed at [place] [address] [place], [place] [address]    Culture   Final    NO GROWTH 2 DAYS Performed at [place] [address], [place], [place] [address]    Report Status PENDING  Incomplete  Culture, blood (routine x 2)     Status: None (Preliminary result)   Collection Time: 04/27/18  4:42 PM  Result Value Ref Range Status   Specimen Description   Final    BLOOD RIGHT ARM Performed at [place] [address] [place], [place] [address]    Special Requests   Final    BOTTLES  DRAWN AEROBIC ONLY Blood Culture adequate volume Performed at [place] [address] [place], [place] [address]    Culture   Final    NO GROWTH 2 DAYS Performed at [place] [address], [place], [place] [address]    Report Status PENDING  Incomplete     Medications:   . apixaban  5 mg Oral BID  . carvedilol  3.125 mg Oral BID WC  . chlorhexidine  15 mL Mouth Rinse BID  . dexamethasone  12 mg Intravenous Q12H  . ipratropium-albuterol  3 mL Nebulization Q6H  . mouth rinse  15 mL Mouth Rinse q12n4p  . polyethylene glycol  17 g Oral Daily  . senna-docusate  1 tablet  Oral QHS   Continuous Infusions: . sodium chloride Stopped (04/28/18 0005)  . piperacillin-tazobactam (ZOSYN)  IV Stopped (04/30/18 0351)      LOS: 3 days   Charlynne Cousins  [facility]   *Please refer to [email or web address], password [email or web address] to get updated schedule on who will round on this patient, as hospitalists switch teams weekly. If 7PM-7AM, please contact night-coverage at [email or web address], password [email or web address] for any overnight needs.  04/30/2018, 7:16 AM

## 2018-04-30 NOTE — Progress Notes (Signed)
Per MD patient satuartion okay 88% or greater if patient showing no signs of distress. Will continue to monitor patient.

## 2018-04-30 NOTE — Progress Notes (Signed)
Pt. refuses humidity with HFNC.

## 2018-04-30 NOTE — Progress Notes (Signed)
Palliative Care Brief Note  Attempted to discuss with [name] this AM.  He refused to engage.  Will ask palliative care chaplain to follow up to support patient.  Bradley Rough, MD [place] [facility] Team [phone]   NO CHARGE NOTE

## 2018-04-30 NOTE — Progress Notes (Signed)
   Name: Bradley Maldonado MRN: [id number] DOB: 07-31-1950    ADMISSION DATE:  04/27/2018 CONSULTATION DATE:  04/30/2018  REFERRING MD :  Aileen Fass  CHIEF COMPLAINT: Respiratory failure  BRIEF PATIENT DESCRIPTION: 67 year old admitted 12/6 with complaints of SOB and found to be hypoxic. Advanced metastatic lung cancer and chronic right-sided atelectasis with acute respiratory failure requiring BiPAP.  SIGNIFICANT EVENTS  12/06 Admit with hypoxia, required nonrebreather 12/07 BiPAP after return from CT scan 12/08 Transitioned off BiPAP to NRB.  No distress.   STUDIES:  12/7 CT Chest w/o >> progression of tumor with worsening bone mets, extensive tumor in the right middle and lower lobe consolidation, subpulmonic effusion, small left effusion and left lower lobe atelectasis  CULTURES: BCx2 12/6 >>   ANTIBIOTICS:  Zosyn 12/6 >>  SUBJECTIVE:  Pt remains on 100% FiO2 with 35L flow / HFNC.  States he is having a "good day".  Ate 1/2 of his breakfast.  Continues to have small amt blood from nose / nasal dryness from O2.   VITAL SIGNS: Temp:  [97.4 F (36.3 C)-98.2 F (36.8 C)] 98.1 F (36.7 C) (12/09 0750) Pulse Rate:  [90-120] 102 (12/09 0859) Resp:  [18-33] 24 (12/09 0819) BP: (107-143)/(63-92) 132/76 (12/09 0859) SpO2:  [77 %-96 %] 90 % (12/09 0819) FiO2 (%):  [100 %] 100 % (12/09 0819)  PHYSICAL EXAMINATION: General: cachectic male lying in bed in NAD   HEENT: MM pink/moist Neuro: AAOx4, speech clear, MAE / generalized weakness CV: s1s2 rrr, no m/r/g PULM: increased effort but no distress, prolonged exp phase, diminished on right, rhonchi throughout DX:IPJA, non-tender, bsx4 active   Extremities: warm/dry, pedal edema  Skin: no rashes or lesions  Recent Labs  Lab 04/28/18 0315 04/29/18 0352 04/30/18 0340  NA 140 140 141  K 3.6 3.4* 4.5  CL 100 99 101  CO2 30 34* 29  BUN 6* 7* 10  CREATININE 0.72 0.74 0.61  GLUCOSE 100* 106* 150*   Recent Labs  Lab  04/27/18 1305 04/28/18 0315 04/29/18 0352  HGB 13.0 11.9* 11.7*  HCT 42.2 38.6* 39.4  WBC 6.6 5.3 5.6  PLT 152 139* 153   No results found.  ASSESSMENT / PLAN:  Acute Respiratory Failure with Hypoxia Metastatic Adenocarcinoma  -mets to lung, brain and bone with progression on CT imaging this admit Chronic Right Atelectasis Recent Pulmonary Embolism  P: Continue O2 support, saturation goal >88% HFNC  He has had multiple conversations with providers regarding goals of care > discussed with palliative care.  Feel he is overwhelmed with conversations and becomes anxious. I did not discuss any palliative issues on today's visit (first time meeting).   Pulmonary hygiene-IS, mobilize  Follow intermittent CXR Nasal saline for dryness  Decadron 12mg  Q12 Continue empiric abx  He continues to want aggressive medical care per discussions with Dr. Aileen Fass Continue Eliquis   Noe Gens, NP-C [place] [facility] Pgr: [phone] or if no answer [phone] 04/30/2018, 10:44 AM

## 2018-04-30 NOTE — Progress Notes (Signed)
Pt desating in the mid 38s on 15L [place] high flow. Set up heated high flow on 35 L and 100% O2 sats are 90%. Bipap in room on standby.

## 2018-05-01 LAB — BASIC METABOLIC PANEL
Anion gap: 10 (ref 5–15)
BUN: 13 mg/dL (ref 8–23)
CO2: 29 mmol/L (ref 22–32)
Calcium: 8.5 mg/dL — ABNORMAL LOW (ref 8.9–10.3)
Chloride: 100 mmol/L (ref 98–111)
Creatinine, Ser: 0.63 mg/dL (ref 0.61–1.24)
GFR calc non Af Amer: >60 mL/min (ref >60)
Glucose, Bld: 150 mg/dL — ABNORMAL HIGH (ref 70–99)
Potassium: 4.6 mmol/L (ref 3.5–5.1)
Sodium: 139 mmol/L (ref 135–145)

## 2018-05-01 LAB — MRSA PCR SCREENING: MRSA by PCR: NEGATIVE

## 2018-05-01 NOTE — Plan of Care (Signed)
?  Problem: Elimination: ?Goal: Will not experience complications related to bowel motility ?Outcome: Progressing ?  ?Problem: Pain Managment: ?Goal: General experience of comfort will improve ?Outcome: Progressing ?  ?Problem: Safety: ?Goal: Ability to remain free from injury will improve ?Outcome: Progressing ?  ?

## 2018-05-01 NOTE — Progress Notes (Signed)
   05/01/18 1000  Clinical Encounter Type  Visited With Patient  Visit Type Follow-up;Psychological support;Spiritual support  Referral From Other (Comment) (Respiratory )  Consult/Referral To Chaplain  Spiritual Encounters  Spiritual Needs Emotional;Other (Comment) (Spiritual Care Conversation/Support)  Stress Factors  Patient Stress Factors Health changes;Major life changes   I visited with the patient per referral from the respiratory therapist.  The patient was awake and receptive to my support. He stated that he is weak and doesn't feel good this morning. He asked that I follow up with him a little later.  Please, contact Spiritual Care for further assistance.   Chaplain Shanon Ace M.Div., [facility]

## 2018-05-01 NOTE — Progress Notes (Signed)
[facility] PROGRESS NOTE    Progress Note  Bradley Maldonado  OIN:[id number] DOB: 1950-06-07 DOA: 04/27/2018 PCP: [name], NP-C     Brief Narrative:   Bradley Maldonado is an 67 y.o. male past medical history significant of stage IV non-small cell lung cancer with brain metastases treated with palliative chemo and radiation, chronic systolic heart failure with an EF of 25% and pulmonary embolism comes in for worsening dyspnea, the patient has been bedridden due to severe dyspnea, accompanied by congestion and progressive productive cough on the day of admission he went to see the oncologist and found saturations at 80% so he was evaluated for further evaluation  Assessment/Plan:   Acute on chronic respiratory failure with hypoxia likely diffuse interstitial spread of non-small cell carcinoma: CT chest showed progressive diffuse infiltrated malignancy which is responsible for his respiratory failure. Is futile to intubate this patient due to his progressive infiltrative malignancy He was started on IV steroids with no improvement in his respiration. He continues to be on high flow nasal cannula and barely keeping his oxygen saturation above 90%. When he eats or when he talks for some time he desats pretty quickly Critical situation with a tenuous patient with unrealistic expectation.  Chronic systolic heart failure: Continue Coreg and losartan. Try to continue to keep him on the dry side  Essential hypertension: Continue Coreg blood pressure has been stable.  Stage IV non-small cell lung cancer/  Brain metastases [facility]): Currently on immunotherapy ~Oncology they recommended to increase the IV Decadron.  History of pulmonary embolism (HCC) Continue Eliquis.  Constipation: We will give him a tap water enema.  RN Pressure Injury Documentation:   Severe protein caloric malnutrition: Start Ensure he is cachectic Estimated body mass index is 20.85 kg/m as  calculated from the following:   Height as of this encounter: 5\' 11"  (1.803 m).   Weight as of this encounter: 67.8 kg.  DVT prophylaxis: eliquis Family Communication:none Disposition Plan/Barrier to D/C: He will probably will require comfort care due to his respiratory status Code Status:     Code Status Orders  (From admission, onward)         Start     Ordered   04/27/18 1614  Full code  Continuous     04/27/18 1613        Code Status History    Date Active Date Inactive Code Status Order ID Comments User Context   03/01/2018 1559 03/06/2018 1510 Full Code [id number]  [name], MD Inpatient   12/17/2016 1707 12/21/2016 2036 Full Code [id number]  Shela Leff, MD ED        IV Access:    Peripheral IV   Procedures and diagnostic studies:   No results found.   Medical Consultants:    None.  Anti-Infectives:   IV zosyn  Subjective:    Bradley Maldonado he relates his breathing is slightly worse compared to yesterday, he has not had a bowel movement.  He cannot speak in full sentences.  Objective:    Vitals:   05/01/18 0003 05/01/18 0104 05/01/18 0135 05/01/18 0343  BP:   136/75   Pulse:   99   Resp:   (!) 23   Temp: 97.6 F (36.4 C)   98 F (36.7 C)  TempSrc: Oral   Oral  SpO2:  93% 95%   Weight:      Height:        Intake/Output Summary (Last 24 hours) at 05/01/2018 [phone]  Last data filed at 04/30/2018 1706 Gross per 24 hour  Intake 579.78 ml  Output 225 ml  Net 354.78 ml   Filed Weights   04/27/18 1212 04/27/18 1616  Weight: 68.9 kg 67.8 kg    Exam: General exam: In no acute distress, cachectic Respiratory system: Moderate air movement and clear as he is tachypneic Cardiovascular system: S1 & S2 heard, RRR. No JVD. Gastrointestinal system: Abdomen is nondistended, soft and nontender.  Central nervous system: Alert and oriented. No focal neurological deficits. Extremities: No pedal edema. Skin: No rashes, lesions or  ulcers Psychiatry: Judgement and insight appear normal. Mood & affect appropriate.    Data Reviewed:    Labs: Basic Metabolic Panel: Recent Labs  Lab 04/27/18 1305 04/28/18 0315 04/29/18 0352 04/30/18 0340 05/01/18 0257  NA 142 140 140 141 139  K 3.4* 3.6 3.4* 4.5 4.6  CL 100 100 99 101 100  CO2 32 30 34* 29 29  GLUCOSE 135* 100* 106* 150* 150*  BUN 7* 6* 7* 10 13  CREATININE 0.63 0.72 0.74 0.61 0.63  CALCIUM 8.4* 7.9* 7.9* 8.4* 8.5*   GFR Estimated Creatinine Clearance: 84.8 mL/min (by C-G formula based on SCr of 0.63 mg/dL). Liver Function Tests: No results for input(s): AST, ALT, ALKPHOS, BILITOT, PROT, ALBUMIN in the last 168 hours. No results for input(s): LIPASE, AMYLASE in the last 168 hours. No results for input(s): AMMONIA in the last 168 hours. Coagulation profile No results for input(s): INR, PROTIME in the last 168 hours.  CBC: Recent Labs  Lab 04/27/18 1305 04/28/18 0315 04/29/18 0352  WBC 6.6 5.3 5.6  NEUTROABS 5.1  --   --   HGB 13.0 11.9* 11.7*  HCT 42.2 38.6* 39.4  MCV 92.5 93.0 92.3  PLT 152 139* 153   Cardiac Enzymes: No results for input(s): CKTOTAL, CKMB, CKMBINDEX, TROPONINI in the last 168 hours. BNP (last 3 results) No results for input(s): PROBNP in the last 8760 hours. CBG: Recent Labs  Lab 04/29/18 0747 04/29/18 1201  GLUCAP 92 102*   D-Dimer: No results for input(s): DDIMER in the last 72 hours. Hgb A1c: No results for input(s): HGBA1C in the last 72 hours. Lipid Profile: No results for input(s): CHOL, HDL, LDLCALC, TRIG, CHOLHDL, LDLDIRECT in the last 72 hours. Thyroid function studies: No results for input(s): TSH, T4TOTAL, T3FREE, THYROIDAB in the last 72 hours.  Invalid input(s): FREET3 Anemia work up: No results for input(s): VITAMINB12, FOLATE, FERRITIN, TIBC, IRON, RETICCTPCT in the last 72 hours. Sepsis Labs: Recent Labs  Lab 04/27/18 1257 04/27/18 1305 04/27/18 1539 04/28/18 0315 04/29/18 0352  WBC  --   6.6  --  5.3 5.6  LATICACIDVEN 2.01*  --  1.36  --   --    Microbiology Recent Results (from the past 240 hour(s))  Culture, blood (routine x 2)     Status: None (Preliminary result)   Collection Time: 04/27/18  1:05 PM  Result Value Ref Range Status   Specimen Description   Final    BLOOD LEFT FOREARM Performed at [facility], [place] [address] [place], [place] [address]    Special Requests   Final    BOTTLES DRAWN AEROBIC AND ANAEROBIC Blood Culture adequate volume Performed at [place] [address] [place], [place] [address]    Culture   Final    NO GROWTH 3 DAYS Performed at [facility], [place] [address] [place], [place] [address]    Report Status  PENDING  Incomplete  Culture, blood (routine x 2)     Status: None (Preliminary result)   Collection Time: 04/27/18  4:42 PM  Result Value Ref Range Status   Specimen Description   Final    BLOOD RIGHT ARM Performed at [place] [address] [place], [place] [address]    Special Requests   Final    BOTTLES DRAWN AEROBIC ONLY Blood Culture adequate volume Performed at [facility] [address] [place], [place] [address]    Culture   Final    NO GROWTH 3 DAYS Performed at [facility], [place] [address] [place], [place] [address]    Report Status PENDING  Incomplete     Medications:   . apixaban  5 mg Oral BID  . carvedilol  3.125 mg Oral BID WC  . chlorhexidine  15 mL Mouth Rinse BID  . dexamethasone  12 mg Intravenous Q12H  . ipratropium-albuterol  3 mL Nebulization Q6H  . mouth rinse  15 mL Mouth Rinse q12n4p  . polyethylene glycol  17 g Oral Daily  . senna-docusate  1 tablet Oral QHS  . sodium chloride  2 spray Each Nare Q6H   Continuous Infusions: . sodium chloride Stopped (04/28/18 0005)  . piperacillin-tazobactam (ZOSYN)  IV 3.375 g (05/01/18 0027)      LOS: 4 days   Charlynne Cousins  [facility]   *Please refer to [email or web address], password [email or web address] to get updated schedule on who will round on this patient, as hospitalists switch teams weekly. If 7PM-7AM, please contact night-coverage at [email or web address], password [email or web address] for any overnight needs.  05/01/2018, 7:42 AM

## 2018-05-01 NOTE — Progress Notes (Signed)
Replaced water bottle on heated high flow nasal canula system- uneventful.

## 2018-05-01 NOTE — Care Management Note (Signed)
Case Management Note  Patient Details  Name: Bradley Maldonado MRN: [id number] Date of Birth: [birth year]  Subjective/Objective:                  Acute on chronic respiratory failure with hypoxia likely diffuse interstitial spread of non-small cell carcinoma: CT chest showed progressive diffuse infiltrated malignancy which is responsible for his respiratory failure. Is futile to intubate this patient due to his progressive infiltrative malignancy He was started on IV steroids with no improvement in his respiration. He continues to be on high flow nasal cannula and barely keeping his oxygen saturation above 90%. When he eats or when he talks for some time he desats pretty quickly Critical situation with a tenuous patient with unrealistic expectation.  Chronic systolic heart failure: Continue Coreg and losartan. Try to continue to keep him on the dry side  Essential hypertension: Continue Coreg blood pressure has been stable.  Stage IV non-small cell lung cancer/  Brain metastases [facility]): Currently on immunotherapy ~Oncology they recommended to increase the IV Decadron.  History of pulmonary embolism (HCC) Continue Eliquis.  Constipation: We will give him a tap water enema.  RN Pressure Injury Documentation: Severe protein caloric malnutrition: Start Ensure he is cachectic Estimated body mass index is 20.85 kg/m as calculated from the following:   Height as of this encounter: 5\' 11"  (1.803 m).   Weight as of this encounter: 67.8 kg.  Action/Plan:  Will follow for progression of care and clinical status. Will follow for case management needs none present at this time.  Expected Discharge Date:  (unknown)               Expected Discharge Plan:  Home/Self Care  In-House Referral:     Discharge planning Services  CM Consult  Post Acute Care Choice:    Choice offered to:     DME Arranged:    DME Agency:     HH Arranged:    HH Agency:     Status of Service:  In  process, will continue to follow  If discussed at Long Length of Stay Meetings, dates discussed:    Additional Comments:  [name], RN 05/01/2018, 9:36 AM

## 2018-05-01 NOTE — Progress Notes (Addendum)
   Name: Bradley Maldonado MRN: [id number] DOB: January 26, 1951    ADMISSION DATE:  04/27/2018 CONSULTATION DATE:  05/01/2018  REFERRING MD :  Aileen Fass  CHIEF COMPLAINT: Respiratory failure  BRIEF PATIENT DESCRIPTION: 67 year old admitted 12/6 with complaints of SOB and found to be hypoxic. Advanced metastatic lung cancer and chronic right-sided atelectasis with acute respiratory failure requiring BiPAP.  SIGNIFICANT EVENTS  12/06 Admit with hypoxia, required nonrebreather 12/07 BiPAP after return from CT scan 12/08 Transitioned off BiPAP to NRB.  No distress.   STUDIES:  12/7 CT Chest w/o >> progression of tumor with worsening bone mets, extensive tumor in the right middle and lower lobe consolidation, subpulmonic effusion, small left effusion and left lower lobe atelectasis  CULTURES: BCx2 12/6 >>   ANTIBIOTICS:  Zosyn 12/6 >>  SUBJECTIVE: Pt reports he did not need BiPAP overnight. Remains on 30L flow, 100% HFNC. At ~ 50% of his breakfast.   VITAL SIGNS: Temp:  [97.6 F (36.4 C)-98 F (36.7 C)] 97.9 F (36.6 C) (12/10 0800) Pulse Rate:  [80-99] 91 (12/10 1035) Resp:  [13-24] 24 (12/10 1000) BP: (109-140)/(60-81) 140/79 (12/10 1035) SpO2:  [87 %-95 %] 92 % (12/10 1000) FiO2 (%):  [90 %-100 %] 100 % (12/10 0904)  PHYSICAL EXAMINATION: General: cachectic adult male lying in bed in NAD HEENT: MM pink/moist, no jvd Neuro: Awake, alert, oriented. MAE, generalized weakness  CV: s1s2 rrr, no m/r/g PULM: non-labored, diminished/no air movment on R, diminished on left  QB:VQXI, non-tender, bsx4 active  Extremities: warm/dry, 1+ pedal edema  Skin: no rashes or lesions  Recent Labs  Lab 04/29/18 0352 04/30/18 0340 05/01/18 0257  NA 140 141 139  K 3.4* 4.5 4.6  CL 99 101 100  CO2 34* 29 29  BUN 7* 10 13  CREATININE 0.74 0.61 0.63  GLUCOSE 106* 150* 150*   Recent Labs  Lab 04/27/18 1305 04/28/18 0315 04/29/18 0352  HGB 13.0 11.9* 11.7*  HCT 42.2 38.6* 39.4  WBC  6.6 5.3 5.6  PLT 152 139* 153   No results found.  ASSESSMENT / PLAN:  Acute Respiratory Failure with Hypoxia Metastatic Adenocarcinoma  -mets to lung, brain and bone with progression on CT imaging this admit Chronic Right Atelectasis Recent Pulmonary Embolism  P: O2 for sats >88% HFNC with PRN bipap for increased WOB  Consider morphine for dyspnea  Nasal saline for dryness Decadron 12mg  Q12 Continue empiric abx  Continue eliquis for PE  Pulmonary hygiene - IS, mobilize  Would not recommend vent support, CPR in the event of arrest.  See Dr. Anastasia Pall note from 12/9    Noe Gens, NP-C [place] Pgr: [phone] or if no answer [phone] 05/01/2018, 10:53 AM

## 2018-05-02 ENCOUNTER — Inpatient Hospital Stay: Payer: Medicare Other | Admitting: Oncology

## 2018-05-02 ENCOUNTER — Inpatient Hospital Stay: Payer: Medicare Other

## 2018-05-02 LAB — CULTURE, BLOOD (ROUTINE X 2)
Culture: NO GROWTH
Culture: NO GROWTH
SPECIAL REQUESTS: ADEQUATE
Special Requests: ADEQUATE

## 2018-05-02 NOTE — Progress Notes (Signed)
Daily Progress Note   Patient Name: Bradley Maldonado       Date: 05/02/2018 DOB: 06-28-1950  Age: 67 y.o. MRN#: [id number] Attending Physician: Florencia Reasons, MD Primary Care Physician: [name], NP-C Admit Date: 04/27/2018  Reason for Consultation/Follow-up: Establishing goals of care  Subjective:  patient is awake, resting by the side of his bed.  He states that he is having a better day, denies shortness of breath Remains no high flow O2.  See below   Length of Stay: 5  Current Medications: Scheduled Meds:  . apixaban  5 mg Oral BID  . carvedilol  3.125 mg Oral BID WC  . chlorhexidine  15 mL Mouth Rinse BID  . dexamethasone  12 mg Intravenous Q12H  . ipratropium-albuterol  3 mL Nebulization Q6H  . mouth rinse  15 mL Mouth Rinse q12n4p  . polyethylene glycol  17 g Oral Daily  . senna-docusate  1 tablet Oral QHS  . sodium chloride  2 spray Each Nare Q6H    Continuous Infusions: . sodium chloride Stopped (04/28/18 0005)  . piperacillin-tazobactam (ZOSYN)  IV Stopped (05/02/18 1344)    PRN Meds: sodium chloride, acetaminophen **OR** acetaminophen, ipratropium-albuterol, ondansetron **OR** ondansetron (ZOFRAN) IV, sodium chloride  Physical Exam         Gen: chronically ill appearing, temporal wasting  neck supple PULM: No air movement on the right, CTA L.    CV:   trace edema GI: BS+, soft, nontender  Psyche: normal mood and affect, but avoidant in nature, doesn't discuss  Vital Signs: BP 113/66   Pulse 81   Temp 97.7 F (36.5 C) (Oral)   Resp 16   Ht 5\' 11"  (1.803 m)   Wt 67.8 kg   SpO2 96%   BMI 20.85 kg/m  SpO2: SpO2: 96 % O2 Device: O2 Device: High Flow Nasal Cannula O2 Flow Rate: O2 Flow Rate (L/min): 25 L/min  Intake/output summary:    Intake/Output Summary (Last 24 hours) at 05/02/2018 1454 Last data filed at 05/02/2018 1434 Gross per 24 hour  Intake 468.55 ml  Output 1840 ml  Net -1371.45 ml   LBM: Last BM Date: 05/01/18 Baseline Weight: Weight: 68.9 kg Most recent weight: Weight: 67.8 kg       Palliative Assessment/Data: PPS 30%   Flowsheet Rows  Most Recent Value  Intake Tab  Referral Department  Hospitalist  Unit at Time of Referral  ICU  Palliative Care Primary Diagnosis  Cancer  Date Notified  04/28/18  Palliative Care Type  Return patient Palliative Care  Reason for referral  Clarify Goals of Care  Date of Admission  04/27/18  Date first seen by Palliative Care  04/28/18  # of days Palliative referral response time  0 Day(s)  # of days IP prior to Palliative referral  1  Clinical Assessment  Palliative Performance Scale Score  50%  Pain Max last 24 hours  0  Pain Min Last 24 hours  0  Dyspnea Max Last 24 Hours  8  Dyspnea Min Last 24 hours  3  Psychosocial & Spiritual Assessment  Palliative Care Outcomes  Patient/Family meeting held?  Yes  Who was at the meeting?  patient  Palliative Care Outcomes  Clarified goals of care      Patient Active Problem List   Diagnosis Date Noted  . Acute on chronic respiratory failure with hypoxia ([place]) 04/28/2018  . Lobar pneumonia ([place]) 04/28/2018  . Chronic systolic CHF (congestive heart failure) ([place]) 04/28/2018  . History of pulmonary embolism ([place]) 04/28/2018  . Encounter for antineoplastic chemotherapy 04/04/2018  . Brain metastases ([place]) 03/14/2018  . Acute combined systolic and diastolic heart failure ([place]) 03/05/2018  . Recurrent right pleural effusion 03/01/2018  . Encounter for antineoplastic immunotherapy 02/15/2017  . Adenocarcinoma of right lung, stage 4 ([place]) 01/24/2017  . Goals of care, counseling/discussion 01/24/2017  . Status post thoracentesis   . Chronic obstructive pulmonary disease ([place])   . Lung cancer ([place]) 12/20/2016   . Pleural effusion, malignant 12/17/2016  . Hypertension 12/17/2016  . History of right hip replacement 12/17/2016    Palliative Care Assessment & Plan   Patient Profile:    Assessment: Life limiting illness:  Acute respiratory failure with hypoxemia due to advanced adenocarcinoma of the right lung (complete obliteration of the R lung) and emphysema of left and PE of left.   History of brain mets s/p palliative chemo and radiation.  Chronic systolic heart failure with EF 25%   Recommendations/Plan:  Family meeting: I discussed with the patient, and also with PCCM staff Dr Lake Bells and Ms Alfredo Martinez, NP earlier this am.  The patient was resting in bed, he stated that "serious" and "heavy" information was being thrown at him, on a daily basis, his wishes, he reports, have been conveyed to his family by him.   "When something happens, you call my Aunt Ms Josefa Half. She knows what I want. I've told my daughter also about what I want."  Mr Aigner doesn't wish to engage with me about DNR DNI, about a hospice/comfort focused mode of care. We discussed about his high O2 requirements, he wants to go home, but refuses to discuss further about going home with hospice services.   After not responding to my conversations, Mr Holeman simply states that he was feeling more anxious and that he didn't want to continue this conversation, at which point I left his room.   Call placed and discussed with his [place]. We discussed about his life limiting illness, the conditions of his lungs, and his current high flow 02 requirements. Additionally, I discussed with her, frankly and compassionately, that there would be no advantage to the patient undergoing CPR or ACLS protocol at the end of life. We discussed that the patient is facing a serious, incurable, and irreversible illness,  soon approaching the end of his life and that it would be most prudent to shift to a mode of care that focuses exclusively  on comfort and dignity at end of life.   Ms Josefa Half states that the patient has 3 daughters, how ever, he is only close with one of them, Ms Auther Lyerly, she lives in [place], [place]. Ms Josefa Half didn't have ms Wanda's contact information on her at the time that we talked, how ever, she will get her number and give it to the nursing staff when she arrives at the hospital tonight.   Ms Josefa Half states that the patient recently contacted "[employer]" and has "straightened out his affairs", she states that the patient had not accepted how sick he was, but she believes that he is now becoming aware of the serious and incurable nature of his condition.   Ms Josefa Half states that  both she and the patient's daughter Mariann Laster are "kind of prepared" that the patient doesn't have much longer to live. She is completely in agreement and in understanding of not having the patient go through artificial means at end of life.   I will attempt to call daughter Mariann Laster once I get her information, on 05-03-18.    Code Status:    Code Status Orders  (From admission, onward)         Start     Ordered   04/27/18 1614  Full code  Continuous     04/27/18 1613        Code Status History    Date Active Date Inactive Code Status Order ID Comments User Context   03/01/2018 1559 03/06/2018 1510 Full Code [id number]  [name], MD Inpatient   12/17/2016 1707 12/21/2016 2036 Full Code [id number]  Shela Leff, MD ED       Prognosis:   guarded   Discharge Planning:  Anticipated hospital death versus transfer to residential hospice.   Care plan was discussed with  Patient, Ms Alfredo Martinez, NP from PCCM, Dr Lake Bells, and patient's Aunt Ms Josefa Half.   Thank you for allowing the Palliative Medicine Team to assist in the care of this patient.   Time In: 1400 Time Out: 1435 Total Time 35 Prolonged Time Billed  no       Greater than 50%  of this time was spent counseling and coordinating care related to the  above assessment and plan.  Loistine Chance, MD [phone] Please contact Palliative Medicine Team phone at [phone] for questions and concerns.

## 2018-05-02 NOTE — Progress Notes (Signed)
PROGRESS NOTE  Bradley Maldonado BHA:[id number] DOB: [birth year] DOA: 04/27/2018 PCP: [name], NP-C  HPI/Recap of past 24 hours:  [name] on HFNC , he  Denies pain, no fever He does has bilateral pedal edema He has not been out of bed since in the hospital  Assessment/Plan: Active Problems:   Hypertension   Brain metastases (HCC)   Acute on chronic respiratory failure with hypoxia (HCC)   Lobar pneumonia (HCC)   Chronic systolic CHF (congestive heart failure) ([place])   History of pulmonary embolism (HCC)   Acute respiratory failure with hypoxemia due to advanced adenocarcinoma of the right lung (complete obliteration of the R lung) with brain mets / emphysema/copd and PE of left.  -he presented with dyspnea, CT on presentation "showed progressive diffuse infiltrated malignancy" -pulmonary/critical care consulted, recommend continue nebs/steroids/abx/eliquis/HFNC, continue goals of care discussion as cpr and intubation will be futile -palliative care consulted, appreciate input  BILATERAL globe proptosis, without orbital mass. Significance uncertain on CT orbit from 97/3532   Chronic systolic heart failure: lvef 25-30% from echo done in 02/2018 -with mild pedal edema currently -Continue low dose coreg, prn lasix if bp allows -monitor volume status -continue goals of care discussion  Code Status: full  Family Communication: patient   Disposition Plan: poor prognosis, continue goals of care discussion   Consultants:  Pulmonary /critical care  Palliative care  Procedures:  none  Antibiotics:  zosyn   Objective: BP (!) 116/45   Pulse 89   Temp 98.2 F (36.8 C) (Oral)   Resp 18   Ht 5\' 11"  (1.803 m)   Wt 67.8 kg   SpO2 90%   BMI 20.85 kg/m   Intake/Output Summary (Last 24 hours) at 05/02/2018 1223 Last data filed at 05/02/2018 0944 Gross per 24 hour  Intake 459.83 ml  Output 1840 ml  Net -1380.17 ml   Filed Weights   04/27/18 1212 04/27/18  1616  Weight: 68.9 kg 67.8 kg    Exam: Patient is examined daily including today on 05/02/2018, exams remain the same as of yesterday except that has changed    General:  Frail, chronically ill appearing, exophthalmus, left eye appear to have limited range of motion, denies eye pain or blurry vision  Cardiovascular: RRR  Respiratory: diminished, no wheezing, no rales  Abdomen: Soft/ND/NT, positive BS  Musculoskeletal: bilateral pedal pitting Edema  Neuro: alert, oriented   Data Reviewed: Basic Metabolic Panel: Recent Labs  Lab 04/27/18 1305 04/28/18 0315 04/29/18 0352 04/30/18 0340 05/01/18 0257  NA 142 140 140 141 139  K 3.4* 3.6 3.4* 4.5 4.6  CL 100 100 99 101 100  CO2 32 30 34* 29 29  GLUCOSE 135* 100* 106* 150* 150*  BUN 7* 6* 7* 10 13  CREATININE 0.63 0.72 0.74 0.61 0.63  CALCIUM 8.4* 7.9* 7.9* 8.4* 8.5*   Liver Function Tests: No results for input(s): AST, ALT, ALKPHOS, BILITOT, PROT, ALBUMIN in the last 168 hours. No results for input(s): LIPASE, AMYLASE in the last 168 hours. No results for input(s): AMMONIA in the last 168 hours. CBC: Recent Labs  Lab 04/27/18 1305 04/28/18 0315 04/29/18 0352  WBC 6.6 5.3 5.6  NEUTROABS 5.1  --   --   HGB 13.0 11.9* 11.7*  HCT 42.2 38.6* 39.4  MCV 92.5 93.0 92.3  PLT 152 139* 153   Cardiac Enzymes:   No results for input(s): CKTOTAL, CKMB, CKMBINDEX, TROPONINI in the last 168 hours. BNP (last 3 results) Recent Labs  03/01/18 0701 04/27/18 1305  BNP 33.9 37.3    ProBNP (last 3 results) No results for input(s): PROBNP in the last 8760 hours.  CBG: Recent Labs  Lab 04/29/18 0747 04/29/18 1201  GLUCAP 92 102*    Recent Results (from the past 240 hour(s))  Culture, blood (routine x 2)     Status: None (Preliminary result)   Collection Time: 04/27/18  1:05 PM  Result Value Ref Range Status   Specimen Description   Final    BLOOD LEFT FOREARM Performed at [facility]  [address], [place], [place] [address]    Special Requests   Final    BOTTLES DRAWN AEROBIC AND ANAEROBIC Blood Culture adequate volume Performed at [facility] [address] [place], [place] [address]    Culture   Final    NO GROWTH 4 DAYS Performed at [facility], [place] [address], [place], [place] [address]    Report Status PENDING  Incomplete  Culture, blood (routine x 2)     Status: None (Preliminary result)   Collection Time: 04/27/18  4:42 PM  Result Value Ref Range Status   Specimen Description   Final    BLOOD RIGHT ARM Performed at [facility] [address] [place], [place] [address]    Special Requests   Final    BOTTLES DRAWN AEROBIC ONLY Blood Culture adequate volume Performed at [facility] [address], [place], [place] [address]    Culture   Final    NO GROWTH 4 DAYS Performed at [facility], [place] [address] [place], [place] [address]    Report Status PENDING  Incomplete  MRSA PCR Screening     Status: None   Collection Time: 05/01/18 12:34 PM  Result Value Ref Range Status   MRSA by PCR NEGATIVE NEGATIVE Final    Comment:        The GeneXpert MRSA Assay (FDA approved for NASAL specimens only), is one component of a comprehensive MRSA colonization surveillance program. It is not intended to diagnose MRSA infection nor to guide or monitor treatment for MRSA infections. Performed at [facility], [place] [address] [place],  [address]      Studies: No results found.  Scheduled Meds: . apixaban  5 mg Oral BID  . carvedilol  3.125 mg Oral BID WC  . chlorhexidine  15 mL Mouth Rinse BID  . dexamethasone  12 mg Intravenous Q12H  . ipratropium-albuterol  3 mL Nebulization Q6H  . mouth rinse  15 mL Mouth Rinse q12n4p  . polyethylene glycol  17 g Oral Daily  . senna-docusate  1 tablet Oral QHS  . sodium chloride  2 spray Each Nare Q6H    Continuous  Infusions: . sodium chloride Stopped (04/28/18 0005)  . piperacillin-tazobactam (ZOSYN)  IV 3.375 g (05/02/18 0944)     Time spent: 74mins, case discussed with critical care PA Ms Alfredo Martinez I have personally reviewed and interpreted on  05/02/2018 daily labs, tele strips, imagings as discussed above under date review session and assessment and plans.  I reviewed all nursing notes, pharmacy notes, consultant notes,  vitals, pertinent old records  I have discussed plan of care as described above with RN , patient  on 05/02/2018   Florencia Reasons MD, PhD  [facility] Pager [phone]. If 7PM-7AM, please contact night-coverage at [email or web address], password [facility] 05/02/2018, 12:23 PM  LOS: 5 days

## 2018-05-02 NOTE — Progress Notes (Signed)
PCCM    Patient remains stable on 30L O2/100%.  He has been declining meaningful discussion regarding his plan of care.  Palliative care is involved and are attempting to contact family.  PCCM will continue to follow.  Await family discussion / involvement for goals of care.  If change in clinical status, please call sooner.    Noe Gens, NP-C [facility] Pgr: [phone] or if no answer [phone] 05/02/2018, 10:14 AM

## 2018-05-03 ENCOUNTER — Inpatient Hospital Stay: Payer: Medicare Other

## 2018-05-03 DIAGNOSIS — J181 Lobar pneumonia, unspecified organism: Secondary | ICD-10-CM

## 2018-05-03 DIAGNOSIS — R0602 Shortness of breath: Secondary | ICD-10-CM

## 2018-05-03 DIAGNOSIS — Z515 Encounter for palliative care: Secondary | ICD-10-CM

## 2018-05-03 MED ORDER — MORPHINE SULFATE (PF) 2 MG/ML IV SOLN: 2.0000 mg | INTRAVENOUS | Status: DC | PRN

## 2018-05-03 NOTE — Progress Notes (Signed)
Pt states "im comfortable in the bed" pt does not want to sit in chair at this time.

## 2018-05-03 NOTE — Progress Notes (Signed)
Bradley Maldonado KVQ:[id number] DOB: Mar 19, 1951 DOA: 04/27/2018 PCP: [name], NP-C  HPI/Recap of past 24 hours:  He was put of bipap due to desats into the 80's last night he  Denies pain, no fever He does has bilateral pedal edema   Assessment/Plan: Active Problems:   Hypertension   Brain metastases (HCC)   Acute on chronic respiratory failure with hypoxia (HCC)   Lobar pneumonia (HCC)   Chronic systolic CHF (congestive heart failure) (HCC)   History of pulmonary embolism (HCC)   Acute respiratory failure with hypoxemia due to advanced adenocarcinoma of the right lung (complete obliteration of the R lung) with brain mets / emphysema/copd and PE of left.  -he presented with dyspnea, CT on presentation "showed progressive diffuse infiltrated malignancy" -pulmonary/critical care consulted, recommend continue nebs/steroids/abx/eliquis/HFNC, continue goals of care discussion as cpr and intubation will be futile -palliative care consulted, appreciate input  BILATERAL globe proptosis, without orbital mass. Significance uncertain  on CT orbit from 64/3329   Chronic systolic heart failure: lvef 25-30% from echo done in 02/2018 -with mild pedal edema currently -Continue low dose coreg, prn lasix if bp allows -monitor volume status -continue goals of care discussion  Code Status: full  Family Communication: patient   Disposition Plan: poor prognosis, continue goals of care discussion   Consultants:  Pulmonary /critical care  Palliative care  Procedures:  none  Antibiotics:  zosyn   Objective: BP 103/68   Pulse 73   Temp 97.7 F (36.5 C) (Axillary)   Resp 15   Ht 5\' 11"  (1.803 m)   Wt 67.8 kg   SpO2 96%   BMI 20.85 kg/m   Intake/Output Summary (Last 24 hours) at 05/03/2018 5188 Last data filed at 05/03/2018 0400 Gross per 24 hour  Intake 516.79 ml  Output 870 ml  Net -353.21 ml   Filed Weights   04/27/18 1212 04/27/18 1616   Weight: 68.9 kg 67.8 kg    Exam: Patient is examined daily including today on 05/03/2018, exams remain the same as of yesterday except that has changed    General:  Frail, chronically ill appearing, exophthalmus, left eye appear to have limited range of motion, denies eye pain or blurry vision  Cardiovascular: RRR  Respiratory: diminished, no wheezing, no rales  Abdomen: Soft/ND/NT, positive BS  Musculoskeletal: bilateral pedal pitting Edema  Neuro: alert, oriented   Data Reviewed: Basic Metabolic Panel: Recent Labs  Lab 04/27/18 1305 04/28/18 0315 04/29/18 0352 04/30/18 0340 05/01/18 0257  NA 142 140 140 141 139  K 3.4* 3.6 3.4* 4.5 4.6  CL 100 100 99 101 100  CO2 32 30 34* 29 29  GLUCOSE 135* 100* 106* 150* 150*  BUN 7* 6* 7* 10 13  CREATININE 0.63 0.72 0.74 0.61 0.63  CALCIUM 8.4* 7.9* 7.9* 8.4* 8.5*   Liver Function Tests: No results for input(s): AST, ALT, ALKPHOS, BILITOT, PROT, ALBUMIN in the last 168 hours. No results for input(s): LIPASE, AMYLASE in the last 168 hours. No results for input(s): AMMONIA in the last 168 hours. CBC: Recent Labs  Lab 04/27/18 1305 04/28/18 0315 04/29/18 0352  WBC 6.6 5.3 5.6  NEUTROABS 5.1  --   --   HGB 13.0 11.9* 11.7*  HCT 42.2 38.6* 39.4  MCV 92.5 93.0 92.3  PLT 152 139* 153   Cardiac Enzymes:   No results for input(s): CKTOTAL, CKMB, CKMBINDEX, TROPONINI in the last 168 hours. BNP (last 3 results) Recent Labs  03/01/18 0701 04/27/18 1305  BNP 33.9 37.3    ProBNP (last 3 results) No results for input(s): PROBNP in the last 8760 hours.  CBG: Recent Labs  Lab 04/29/18 0747 04/29/18 1201  GLUCAP 92 102*    Recent Results (from the past 240 hour(s))  Culture, blood (routine x 2)     Status: None   Collection Time: 04/27/18  1:05 PM  Result Value Ref Range Status   Specimen Description   Final    BLOOD LEFT FOREARM Performed at [facility] [address], [place], [place]  [address]    Special Requests   Final    BOTTLES DRAWN AEROBIC AND ANAEROBIC Blood Culture adequate volume Performed at [facility] [address], [place], [place] [address]    Culture   Final    NO GROWTH 5 DAYS Performed at [facility], [place] [address] [place], [place] [address]    Report Status 05/02/2018 FINAL  Final  Culture, blood (routine x 2)     Status: None   Collection Time: 04/27/18  4:42 PM  Result Value Ref Range Status   Specimen Description   Final    BLOOD RIGHT ARM Performed at [facility] [address] [place], [place] [address]    Special Requests   Final    BOTTLES DRAWN AEROBIC ONLY Blood Culture adequate volume Performed at [facility] [address] [place], [place] [address]    Culture   Final    NO GROWTH 5 DAYS Performed at [facility], [place] [address] [place], [place] [address]    Report Status 05/02/2018 FINAL  Final  MRSA PCR Screening     Status: None   Collection Time: 05/01/18 12:34 PM  Result Value Ref Range Status   MRSA by PCR NEGATIVE NEGATIVE Final    Comment:        The GeneXpert MRSA Assay (FDA approved for NASAL specimens only), is one component of a comprehensive MRSA colonization surveillance program. It is not intended to diagnose MRSA infection nor to guide or monitor treatment for MRSA infections. Performed at [facility], [place] [address] [place],  [address]      Studies: No results found.  Scheduled Meds: . apixaban  5 mg Oral BID  . carvedilol  3.125 mg Oral BID WC  . chlorhexidine  15 mL Mouth Rinse BID  . dexamethasone  12 mg Intravenous Q12H  . ipratropium-albuterol  3 mL Nebulization Q6H  . mouth rinse  15 mL Mouth Rinse q12n4p  . polyethylene glycol  17 g Oral Daily  . senna-docusate  1 tablet Oral QHS  . sodium chloride  2 spray Each Nare Q6H    Continuous Infusions: . sodium chloride Stopped  (04/28/18 0005)  . piperacillin-tazobactam (ZOSYN)  IV 3.375 g (05/03/18 0310)     Time spent: 83mins I have personally reviewed and interpreted on  05/03/2018 daily labs, tele strips, imagings as discussed above under date review session and assessment and plans.  I reviewed all nursing notes, pharmacy notes, consultant notes,  vitals, pertinent old records  I have discussed plan of care as described above with RN , patient  on 05/03/2018   Florencia Reasons MD, PhD  [facility] Pager [phone]. If 7PM-7AM, please contact night-coverage at [email or web address], password [facility] 05/03/2018, 8:22 AM  LOS: 6 days

## 2018-05-03 NOTE — Progress Notes (Signed)
Daily Progress Note   Patient Name: Bradley Maldonado       Date: 05/03/2018 DOB: 10-Nov-1950  Age: 67 y.o. MRN#: [id number] Attending Physician: Florencia Reasons, MD Primary Care Physician: [name], NP-C Admit Date: 04/27/2018  Reason for Consultation/Follow-up: Establishing goals of care  Subjective:  patient is awake, resting in bed Patient states, " Let it play out naturally. Let's not do things that won't help me." PPS 40%     See below   Length of Stay: 6  Current Medications: Scheduled Meds:  . apixaban  5 mg Oral BID  . carvedilol  3.125 mg Oral BID WC  . chlorhexidine  15 mL Mouth Rinse BID  . dexamethasone  12 mg Intravenous Q12H  . ipratropium-albuterol  3 mL Nebulization Q6H  . mouth rinse  15 mL Mouth Rinse q12n4p  . polyethylene glycol  17 g Oral Daily  . senna-docusate  1 tablet Oral QHS  . sodium chloride  2 spray Each Nare Q6H    Continuous Infusions: . sodium chloride Stopped (04/28/18 0005)  . piperacillin-tazobactam (ZOSYN)  IV 3.375 g (05/03/18 0942)    PRN Meds: sodium chloride, acetaminophen **OR** acetaminophen, ipratropium-albuterol, morphine injection, ondansetron **OR** ondansetron (ZOFRAN) IV, sodium chloride  Physical Exam         Gen: chronically ill appearing, temporal wasting  neck supple PULM: No air movement on the right, CTA L.    CV:   trace edema GI: BS+, soft, nontender  Psyche: normal mood and affect, but avoidant in nature, doesn't discuss Appears more frail and weak  Vital Signs: BP 103/68   Pulse 73   Temp (!) 97.3 F (36.3 C) (Oral)   Resp 15   Ht 5\' 11"  (1.803 m)   Wt 67.8 kg   SpO2 96%   BMI 20.85 kg/m  SpO2: SpO2: 96 % O2 Device: O2 Device: High Flow Nasal Cannula O2 Flow Rate: O2 Flow Rate (L/min): 35  L/min  Intake/output summary:   Intake/Output Summary (Last 24 hours) at 05/03/2018 1008 Last data filed at 05/03/2018 0942 Gross per 24 hour  Intake 295.19 ml  Output 450 ml  Net -154.81 ml   LBM: Last BM Date: 05/02/18 Baseline Weight: Weight: 68.9 kg Most recent weight: Weight: 67.8 kg       Palliative Assessment/Data: PPS  30%   Flowsheet Rows     Most Recent Value  Intake Tab  Referral Department  Hospitalist  Unit at Time of Referral  ICU  Palliative Care Primary Diagnosis  Cancer  Date Notified  04/28/18  Palliative Care Type  Return patient Palliative Care  Reason for referral  Clarify Goals of Care  Date of Admission  04/27/18  Date first seen by Palliative Care  04/28/18  # of days Palliative referral response time  0 Day(s)  # of days IP prior to Palliative referral  1  Clinical Assessment  Palliative Performance Scale Score  50%  Pain Max last 24 hours  0  Pain Min Last 24 hours  0  Dyspnea Max Last 24 Hours  8  Dyspnea Min Last 24 hours  3  Psychosocial & Spiritual Assessment  Palliative Care Outcomes  Patient/Family meeting held?  Yes  Who was at the meeting?  patient  Palliative Care Outcomes  Clarified goals of care      Patient Active Problem List   Diagnosis Date Noted  . Acute on chronic respiratory failure with hypoxia ([place]) 04/28/2018  . Lobar pneumonia ([place]) 04/28/2018  . Chronic systolic CHF (congestive heart failure) ([place]) 04/28/2018  . History of pulmonary embolism ([place]) 04/28/2018  . Encounter for antineoplastic chemotherapy 04/04/2018  . Brain metastases ([place]) 03/14/2018  . Acute combined systolic and diastolic heart failure ([place]) 03/05/2018  . Recurrent right pleural effusion 03/01/2018  . Encounter for antineoplastic immunotherapy 02/15/2017  . Adenocarcinoma of right lung, stage 4 ([place]) 01/24/2017  . Goals of care, counseling/discussion 01/24/2017  . Status post thoracentesis   . Chronic obstructive pulmonary disease ([place])   .  Lung cancer ([place]) 12/20/2016  . Pleural effusion, malignant 12/17/2016  . Hypertension 12/17/2016  . History of right hip replacement 12/17/2016    Palliative Care Assessment & Plan   Patient Profile:    Assessment: Life limiting illness:  Acute respiratory failure with hypoxemia due to advanced adenocarcinoma of the right lung (complete obliteration of the R lung) and emphysema of left and PE of left.   History of brain mets s/p palliative chemo and radiation.  Chronic systolic heart failure with EF 25%   Recommendations/Plan:   I discussed with the patient, and also with PCCM staff Dr Lake Bells and Ms Alfredo Martinez, NP .  Subsequently, call placed and also discussed with daughter Hansel Feinstein at [phone].   Brief life review performed: the patient worked in [employer], he had a hip replacement, and then was on disability. He has been living by himself for about 15 years now. The patient's ex wife is Ms Hoy Morn mother, the patient is now divorced. He also has 2 daughters from another relationship. [name] is in touch with her 2 step sisters and they are aware of the patient's condition.   [name] states that she is aware that the patient has lung cancer, he has COPD and that he has heart failure. She states that the patient has been declining in his health for a while now. He did not want to talk about his health. La wanda states that she was most surprised but glad that the patient called [employer] and made arrangements with the funeral director a few days ago.   As per my discussions with the patient, this morning, he does state that he would like for nature to take its course, to let things play out naturally. We discussed about living as  well as we can for as long as we can, we talked about not focusing on how long or short his prognosis is, but on making sure that he is kept as comfortable as possible. [name] in full agreement.   I agree with DNR DNI. I have  discussed this with the patient's daughter, that it is a medical recommendation that he not undergo CPR or aggressive mechanical measures at end of life. She is in full agreement.   We also briefly discussed about hospice philosophy of care, if the patient has ongoing high symptom burden, poor Po intake and ongoing functional decline, then he will benefit from transitioning to residential hospice towards the end of this hospitalization, daughter is aware of the hospice home in [place], [facility].   Will also add low dose PRN IV opioids for symptom management of pain/dyspnea. Monitor needs.       Code Status: Now DNR DNI as of 05-03-18.      Code Status Orders  (From admission, onward)         Start     Ordered   04/27/18 1614  Full code  Continuous     04/27/18 1613        Code Status History    Date Active Date Inactive Code Status Order ID Comments User Context   03/01/2018 1559 03/06/2018 1510 Full Code [id number]  [name], MD Inpatient   12/17/2016 1707 12/21/2016 2036 Full Code [id number]  Shela Leff, MD ED       Prognosis:   guarded   Discharge Planning:  Anticipated hospital death versus transfer to residential hospice.   Care plan was discussed with  Patient, Ms Alfredo Martinez, NP from PCCM, Dr Lake Bells, and patient's daughter Hansel Feinstein at [phone].    Thank you for allowing the Palliative Medicine Team to assist in the care of this patient.   Time In: 9.30 Time Out: 10.05 Total Time 35 Prolonged Time Billed  no       Greater than 50%  of this time was spent counseling and coordinating care related to the above assessment and plan.  Loistine Chance, MD [phone] Please contact Palliative Medicine Team phone at [phone] for questions and concerns.

## 2018-05-03 NOTE — Progress Notes (Signed)
[facility]  Case discussed with Dr. Rowe Pavy.  The patient's family (daughter) understands that he has a disease that man cannot cure her and as his condition worsens understands that life support would be medically futile.  They also are agreeable to inpatient hospice if appropriate.  I will write an order for DNR  Roselie Awkward, MD [facility] Pager: [phone] Cell: [phone] If no response, call [phone]

## 2018-05-03 NOTE — Progress Notes (Signed)
   Name: Bradley Maldonado MRN: [id number] DOB: Nov 26, 1950    ADMISSION DATE:  04/27/2018 CONSULTATION DATE:  05/03/2018  REFERRING MD :  Aileen Fass  CHIEF COMPLAINT: Respiratory failure  BRIEF PATIENT DESCRIPTION: 67 year old admitted 12/6 with complaints of SOB and found to be hypoxic. Advanced metastatic lung cancer and chronic right-sided atelectasis with acute respiratory failure requiring BiPAP.  SIGNIFICANT EVENTS  12/06 Admit with hypoxia, required nonrebreather 12/07 BiPAP after return from CT scan 12/08 Transitioned off BiPAP to NRB.  No distress.   STUDIES:  12/7 CT Chest w/o >> progression of tumor with worsening bone mets, extensive tumor in the right middle and lower lobe consolidation, subpulmonic effusion, small left effusion and left lower lobe atelectasis  CULTURES: BCx2 12/6 >>   ANTIBIOTICS:  Zosyn 12/6 >>  SUBJECTIVE: He says that he feels okay.  Says that he wants to get up out of bed into a chair.  Overnight he was placed on BiPAP briefly but did not tolerate it well.  This was done because of low O2 saturation.  VITAL SIGNS: Temp:  [97.3 F (36.3 C)-98 F (36.7 C)] 97.3 F (36.3 C) (12/12 0802) Pulse Rate:  [69-103] 73 (12/12 0400) Resp:  [14-21] 15 (12/12 0400) BP: (91-127)/(45-95) 103/68 (12/12 0400) SpO2:  [85 %-98 %] 96 % (12/12 0802) FiO2 (%):  [80 %-100 %] 90 % (12/12 0802)  PHYSICAL EXAMINATION:  General:  Cachectic, resting comfortably in bed HENT: NCAT OP clear PULM: No air movement on R, clear on left, normal effort CV: RRR, no mgr GI: BS+, soft, nontender MSK: normal bulk and tone Neuro: awake, alert, conversant   Recent Labs  Lab 04/29/18 0352 04/30/18 0340 05/01/18 0257  NA 140 141 139  K 3.4* 4.5 4.6  CL 99 101 100  CO2 34* 29 29  BUN 7* 10 13  CREATININE 0.74 0.61 0.63  GLUCOSE 106* 150* 150*   Recent Labs  Lab 04/27/18 1305 04/28/18 0315 04/29/18 0352  HGB 13.0 11.9* 11.7*  HCT 42.2 38.6* 39.4  WBC 6.6 5.3  5.6  PLT 152 139* 153   No results found.  ASSESSMENT / PLAN:  Acute Respiratory Failure with Hypoxia Metastatic Adenocarcinoma  -mets to lung, brain and bone with progression on CT imaging this admit Chronic Right Atelectasis Recent Pulmonary Embolism Centrilobular emphysema  Prognosis is dismal, he will not survive this illness  P: Wean O2 to maintain O2 saturation greater than 88% with high flow nasal cannula Out of bed as able Continue Decadron as prescribed Continue Eliquis for pulmonary embolism  Goals of care conversation: It sounds as if he is now coming around on the idea of acknowledging that further aggressive interventions would cause more harm than good and he just wants to focus on anything that will not hurt him.  As stated previously he has end-stage malignancy and will not survive this illness.  As such life supportive measures would not be helpful, I have expressed this to him.  Palliative care plans to discuss the situation with his daughter today.  The best move going forward would be to consider an hospital or inpatient hospice.  Palliative medicine involvement appreciated  Roselie Awkward, MD [facility] Pager: [phone] Cell: [phone] If no response, call [phone]  05/03/2018, 9:52 AM

## 2018-05-03 NOTE — Care Management Note (Signed)
Case Management Note  Patient Details  Name: Bradley Maldonado MRN: [id number] Date of Birth: June 26, 1950  Subjective/Objective:                  He was put of bipap due to desats into the 80's last night he  Denies pain, no fever He does has bilateral pedal edema   Assessment/Plan: Active Problems:   Hypertension   Brain metastases (HCC)   Acute on chronic respiratory failure with hypoxia (HCC)   Lobar pneumonia (HCC)   Chronic systolic CHF (congestive heart failure) ([place])   History of pulmonary embolism (HCC)  Action/Plan: Will follow for progression of care and clinical status. Will follow for case management needs none present at this time.  Expected Discharge Date:  (unknown)               Expected Discharge Plan:  Home/Self Care  In-House Referral:     Discharge planning Services  CM Consult  Post Acute Care Choice:    Choice offered to:     DME Arranged:    DME Agency:     HH Arranged:    HH Agency:     Status of Service:  In process, will continue to follow  If discussed at Long Length of Stay Meetings, dates discussed:    Additional Comments:  [name], RN 05/03/2018, 8:49 AM

## 2018-05-04 NOTE — Progress Notes (Signed)
[facility]  We recommend hospice care at this point PCCM available prn  Roselie Awkward, MD [facility] Pager: [phone] Cell: [phone] If no response, call [phone]

## 2018-05-04 NOTE — Progress Notes (Signed)
Daily Progress Note   Patient Name: Bradley Maldonado       Date: 05/04/2018 DOB: 09-18-1950  Age: 67 y.o. MRN#: [id number] Attending Physician: Bradley Reasons, MD Primary Care Physician: Bradley Rm, NP-C Admit Date: 04/27/2018  Reason for Consultation/Follow-up: Establishing goals of care  Subjective:  patient is awake, resting in bed Patient always keeps asking about going home. He states that he has enough O2 tanks at home, his friends can come in and stay with him.   We discussed about his current condition, his high O2 requirements.   There is no family at bedside, patient states that his 2 daughters, including Ms Bradley Maldonado visited with him on 05-03-18 in the evening.     PPS 30%     See below   Length of Stay: 7  Current Medications: Scheduled Meds:  . apixaban  5 mg Oral BID  . carvedilol  3.125 mg Oral BID WC  . chlorhexidine  15 mL Mouth Rinse BID  . dexamethasone  12 mg Intravenous Q12H  . ipratropium-albuterol  3 mL Nebulization Q6H  . mouth rinse  15 mL Mouth Rinse q12n4p  . polyethylene glycol  17 g Oral Daily  . senna-docusate  1 tablet Oral QHS  . sodium chloride  2 spray Each Nare Q6H    Continuous Infusions: . sodium chloride Stopped (04/28/18 0005)  . piperacillin-tazobactam (ZOSYN)  IV Stopped (05/04/18 1249)    PRN Meds: sodium chloride, acetaminophen **OR** acetaminophen, ipratropium-albuterol, morphine injection, ondansetron **OR** ondansetron (ZOFRAN) IV, sodium chloride  Physical Exam         Gen: chronically ill appearing, temporal wasting  neck supple PULM: No air movement on the right, CTA L.    CV:   trace edema GI: BS+, soft, nontender  Psyche: normal mood and affect, but avoidant in nature, doesn't discuss Appears more frail and  weak  Vital Signs: BP 134/68   Pulse (!) 102   Temp 99 F (37.2 C) (Oral)   Resp 20   Ht 5\' 11"  (1.803 m)   Wt 67.8 kg   SpO2 91%   BMI 20.85 kg/m  SpO2: SpO2: 91 % O2 Device: O2 Device: High Flow Nasal Cannula O2 Flow Rate: O2 Flow Rate (L/min): 30 L/min  Intake/output summary:   Intake/Output Summary (Last 24 hours) at 05/04/2018  [place] filed at 05/04/2018 0600 Gross per 24 hour  Intake 28 ml  Output 1050 ml  Net -1022 ml   LBM: Last BM Date: 04/30/18 Baseline Weight: Weight: 68.9 kg Most recent weight: Weight: 67.8 kg       Palliative Assessment/Data: PPS 30%   Flowsheet Rows     Most Recent Value  Intake Tab  Referral Department  Hospitalist  Unit at Time of Referral  ICU  Palliative Care Primary Diagnosis  Cancer  Date Notified  04/28/18  Palliative Care Type  Return patient Palliative Care  Reason for referral  Clarify Goals of Care  Date of Admission  04/27/18  Date first seen by Palliative Care  04/28/18  # of days Palliative referral response time  0 Day(s)  # of days IP prior to Palliative referral  1  Clinical Assessment  Palliative Performance Scale Score  50%  Pain Max last 24 hours  0  Pain Min Last 24 hours  0  Dyspnea Max Last 24 Hours  8  Dyspnea Min Last 24 hours  3  Psychosocial & Spiritual Assessment  Palliative Care Outcomes  Patient/Family meeting held?  Yes  Who was at the meeting?  patient  Palliative Care Outcomes  Clarified goals of care      Patient Active Problem List   Diagnosis Date Noted  . Shortness of breath   . Palliative care by specialist   . Acute on chronic respiratory failure with hypoxia ([place]) 04/28/2018  . Lobar pneumonia ([place]) 04/28/2018  . Chronic systolic CHF (congestive heart failure) ([place]) 04/28/2018  . History of pulmonary embolism ([place]) 04/28/2018  . Encounter for antineoplastic chemotherapy 04/04/2018  . Brain metastases ([place]) 03/14/2018  . Acute combined systolic and diastolic heart  failure ([place]) 03/05/2018  . Recurrent right pleural effusion 03/01/2018  . Encounter for antineoplastic immunotherapy 02/15/2017  . Adenocarcinoma of right lung, stage 4 ([place]) 01/24/2017  . Goals of care, counseling/discussion 01/24/2017  . Status post thoracentesis   . Chronic obstructive pulmonary disease ([place])   . Lung cancer ([place]) 12/20/2016  . Pleural effusion, malignant 12/17/2016  . Hypertension 12/17/2016  . History of right hip replacement 12/17/2016    Palliative Care Assessment & Plan   Patient Profile:    Assessment: Life limiting illness:  Acute respiratory failure with hypoxemia due to advanced adenocarcinoma of the right lung (complete obliteration of the R lung) and emphysema of left and PE of left.   History of brain mets s/p palliative chemo and radiation.  Chronic systolic heart failure with EF 25%   Recommendations/Plan:  continue current mode of care Recommend hospice, agree with PCCM recommendations, how ever, patient is alert/decisional in my opinion and wants to go home instead of residential hospice. Family, including next of kin daughter Bradley Maldonado agree with [place] that home is not a safe discharge option. The patient will most benefit from residential hospice towards the end of this hospitalization, and we will continue goals of care and disposition discussions.  For now, low dose PRN IV opioids for symptom management of pain/dyspnea. Monitor needs.       Code Status: Now DNR DNI as of 05-03-18.      Code Status Orders  (From admission, onward)         Start     Ordered   04/27/18 1614  Full code  Continuous     04/27/18 1613        Code Status History    Date  Active Date Inactive Code Status Order ID Comments User Context   03/01/2018 1559 03/06/2018 1510 Full Code [id number]  [name], MD Inpatient   12/17/2016 1707 12/21/2016 2036 Full Code [id number]  Shela Leff, MD ED       Prognosis:   guarded   Discharge  Planning:  Anticipated hospital death versus transfer to residential hospice.   Care plan was discussed with  Patient, bedside RN    Thank you for allowing the Palliative Medicine Team to assist in the care of this patient.   Time In: 10 Time Out: 10.25 Total Time 25 Prolonged Time Billed  no       Greater than 50%  of this time was spent counseling and coordinating care related to the above assessment and plan.  Loistine Chance, MD [phone] Please contact Palliative Medicine Team phone at [phone] for questions and concerns.

## 2018-05-04 NOTE — Progress Notes (Signed)
PROGRESS NOTE  Bradley Maldonado YDX:[id number] DOB: [birth year] DOA: 04/27/2018 PCP: [name], NP-C  HPI/Recap of past 24 hours:  Remains on high flow Does not engage with conversation  Assessment/Plan: Active Problems:   Hypertension   Brain metastases ([place])   Acute on chronic respiratory failure with hypoxia (HCC)   Lobar pneumonia (HCC)   Chronic systolic CHF (congestive heart failure) (HCC)   History of pulmonary embolism (HCC)   Shortness of breath   Palliative care by specialist   Acute respiratory failure with hypoxemia due to advanced adenocarcinoma of the right lung (complete obliteration of the R lung) with brain mets / emphysema/copd and PE of left.  -he presented with dyspnea, CT on presentation "showed progressive diffuse infiltrated malignancy" -pulmonary/critical care consulted, recommend continue nebs/steroids/abx/eliquis/HFNC, continue goals of care discussion as cpr and intubation will be futile -palliative care consulted, appreciate input  BILATERAL globe proptosis, without orbital mass. Significance uncertain  on CT orbit from 72/0947   Chronic systolic heart failure: lvef 25-30% from echo done in 02/2018 -with mild pedal edema currently -Continue low dose coreg, prn lasix if bp allows -monitor volume status -continue goals of care discussion  Code Status: DNR  Family Communication: patient   Disposition Plan: poor prognosis, continue goals of care discussion In-hospital death versus residential hospice   Consultants:  Pulmonary /critical care  Palliative care  Procedures:  none  Antibiotics:  zosyn   Objective: BP (!) 158/82   Pulse 96   Temp 97.6 F (36.4 C) (Oral)   Resp 20   Ht 5\' 11"  (1.803 m)   Wt 67.8 kg   SpO2 91%   BMI 20.85 kg/m   Intake/Output Summary (Last 24 hours) at 05/04/2018 2016 Last data filed at 05/04/2018 1711 Gross per 24 hour  Intake 14.42 ml  Output 1050 ml  Net -1035.58 ml   Filed  Weights   04/27/18 1212 04/27/18 1616  Weight: 68.9 kg 67.8 kg    Exam: Patient is examined daily including today on 05/04/2018, exams remain the same as of yesterday except that has changed    M[place]onado:  Frail, chronically ill appearing, exophthalmus, left eye appear to have limited range of motion, denies eye pain or blurry vision  Cardiovascular: RRR  Respiratory: diminished, no wheezing, no rales  Abdomen: Soft/ND/NT, positive BS  Musculoskeletal: bilateral pedal pitting Edema  Neuro: alert, oriented   Data Reviewed: Basic Metabolic Panel: Recent Labs  Lab 04/28/18 0315 04/29/18 0352 04/30/18 0340 05/01/18 0257  NA 140 140 141 139  K 3.6 3.4* 4.5 4.6  CL 100 99 101 100  CO2 30 34* 29 29  GLUCOSE 100* 106* 150* 150*  BUN 6* 7* 10 13  CREATININE 0.72 0.74 0.61 0.63  CALCIUM 7.9* 7.9* 8.4* 8.5*   Liver Function Tests: No results for input(s): AST, ALT, ALKPHOS, BILITOT, PROT, ALBUMIN in the last 168 hours. No results for input(s): LIPASE, AMYLASE in the last 168 hours. No results for input(s): AMMONIA in the last 168 hours. CBC: Recent Labs  Lab 04/28/18 0315 04/29/18 0352  WBC 5.3 5.6  HGB 11.9* 11.7*  HCT 38.6* 39.4  MCV 93.0 92.3  PLT 139* 153   Cardiac Enzymes:   No results for input(s): CKTOTAL, CKMB, CKMBINDEX, TROPONINI in the last 168 hours. BNP (last 3 results) Recent Labs    03/01/18 0701 04/27/18 1305  BNP 33.9 37.3    ProBNP (last 3 results) No results for input(s): PROBNP in the last 8760 hours.  CBG: Recent Labs  Lab 04/29/18 0747 04/29/18 1201  GLUCAP 92 102*    Recent Results (from the past 240 hour(s))  Culture, blood (routine x 2)     Status: None   Collection Time: 04/27/18  1:05 PM  Result Value Ref Range Status   Specimen Description   Final    BLOOD LEFT FOREARM Performed at [facility] [address], [place], [place] [address]    Special Requests   Final    BOTTLES DRAWN AEROBIC AND  ANAEROBIC Blood Culture adequate volume Performed at [facility] [address] [place], [place] [address]    Culture   Final    NO GROWTH 5 DAYS Performed at [facility], [place] [address] [place], [place] [address]    Report Status 05/02/2018 FINAL  Final  Culture, blood (routine x 2)     Status: None   Collection Time: 04/27/18  4:42 PM  Result Value Ref Range Status   Specimen Description   Final    BLOOD RIGHT ARM Performed at [facility] [address], [place], [place] [address]    Special Requests   Final    BOTTLES DRAWN AEROBIC ONLY Blood Culture adequate volume Performed at [facility] [address] [place], [place] [address]    Culture   Final    NO GROWTH 5 DAYS Performed at [facility], [place] [address], [place], [place] [address]    Report Status 05/02/2018 FINAL  Final  MRSA PCR Screening     Status: None   Collection Time: 05/01/18 12:34 PM  Result Value Ref Range Status   MRSA by PCR NEGATIVE NEGATIVE Final    Comment:        The GeneXpert MRSA Assay (FDA approved for NASAL specimens only), is one component of a comprehensive MRSA colonization surveillance program. It is not intended to diagnose MRSA infection nor to guide or monitor treatment for MRSA infections. Performed at [facility], [place] [address] [place], [place] [address]      Studies: No results found.  Scheduled Meds: . apixaban  5 mg Oral BID  . carvedilol  3.125 mg Oral BID WC  . chlorhexidine  15 mL Mouth Rinse BID  . dexamethasone  12 mg Intravenous Q12H  . ipratropium-albuterol  3 mL Nebulization Q6H  . mouth rinse  15 mL Mouth Rinse q12n4p  . polyethylene glycol  17 g Oral Daily  . senna-docusate  1 tablet Oral QHS  . sodium chloride  2 spray Each Nare Q6H    Continuous Infusions: . sodium chloride Stopped (04/28/18 0005)  . piperacillin-tazobactam (ZOSYN)  IV 3.375 g (05/04/18  1711)     Time spent: 32mins, case discussed with palliative care Dr. Rowe Pavy  I have personally reviewed and interpreted on  05/04/2018 daily labs, tele strips, imagings as discussed above under date review session and assessment and plans.  I reviewed all nursing notes, pharmacy notes, consultant notes,  vitals, pertinent old records  I have discussed plan of care as described above with RN , patient  on 05/04/2018   Florencia Reasons MD, PhD  [facility] Pager [phone]. If 7PM-7AM, please contact night-coverage at [email or web address], password [facility] 05/04/2018, 8:16 PM  LOS: 7 days

## 2018-05-05 MED ORDER — AMOXICILLIN-POT CLAVULANATE 875-125 MG PO TABS
1.0000 | ORAL_TABLET | Freq: Two times a day (BID) | ORAL | Status: DC
  Administered 2018-05-05 – 2018-05-09 (×9): 1 via ORAL
  Filled 2018-05-05 (×9): qty 1

## 2018-05-05 MED ORDER — GUAIFENESIN ER 600 MG PO TB12
600.0000 mg | ORAL_TABLET | Freq: Two times a day (BID) | ORAL | Status: DC
  Administered 2018-05-05 – 2018-05-15 (×21): 600 mg via ORAL
  Filled 2018-05-05 (×21): qty 1

## 2018-05-05 MED ORDER — FUROSEMIDE 10 MG/ML IJ SOLN
40.0000 mg | Freq: Once | INTRAMUSCULAR | Status: AC
  Administered 2018-05-05: 40 mg via INTRAVENOUS
  Filled 2018-05-05: qty 4

## 2018-05-05 NOTE — Progress Notes (Addendum)
PROGRESS NOTE  Bradley Maldonado UYQ:[id number] DOB: 04/28/1951 DOA: 04/27/2018 PCP: [name], NP-C  HPI/Recap of past 24 hours:  Remains on high flow, but did not require bipap last night, he denies pain, no fever He has congested cough,  he is interactive,  Today ,he states will think about residential hospice placement  Assessment/Plan: Active Problems:   Hypertension   Brain metastases (HCC)   Acute on chronic respiratory failure with hypoxia (HCC)   Lobar pneumonia (HCC)   Chronic systolic CHF (congestive heart failure) (HCC)   History of pulmonary embolism (HCC)   Shortness of breath   Palliative care by specialist   Acute respiratory failure with hypoxemia due to advanced adenocarcinoma of the right lung (complete obliteration of the R lung) with brain mets / emphysema/copd and PE of left.  -he presented with dyspnea, CT on presentation "showed progressive diffuse infiltrated malignancy" -pulmonary/critical care consulted, recommend hospice placement -he is currently on nebs/steroids/abx/eliquis/HFNC -palliative care consulted, appreciate input, patient is DNR, today he agreed to think about residental hospice placement  BILATERAL globe proptosis, without orbital mass. Significance uncertain  on CT orbit from 02/2018 He denies vision problem, no eye pain   Chronic systolic heart failure: lvef 25-30% from echo done in 02/2018 -with mild pedal edema currently -Continue low dose coreg, prn lasix if bp allows, give iv lasix 40mg  x1 today -monitor volume status -DNR , residential hospice placement   Code Status: DNR  Family Communication: patient   Disposition Plan: In-hospital death versus residential hospice   Consultants:  Pulmonary /critical care  Palliative care  Procedures:  none  Antibiotics:  Zosyn from 12/6 to 12/14, change to augmentin   Objective: BP (!) 128/91 (BP Location: Left Arm)   Pulse 87   Temp 97.6 F (36.4 C) (Oral)    Resp 19   Ht 5\' 11"  (1.803 m)   Wt 67.8 kg   SpO2 98%   BMI 20.85 kg/m   Intake/Output Summary (Last 24 hours) at 05/05/2018 0854 Last data filed at 05/05/2018 0600 Gross per 24 hour  Intake 183.03 ml  Output 750 ml  Net -566.97 ml   Filed Weights   04/27/18 1212 04/27/18 1616  Weight: 68.9 kg 67.8 kg    Exam: Patient is examined daily including today on 05/05/2018, exams remain the same as of yesterday except that has changed    General:  Frail, chronically ill appearing, exophthalmus, left eye appear to have limited range of motion, denies eye pain or blurry vision  Cardiovascular: RRR  Respiratory: diminished, + rhonchi, rales , no wheezing  Abdomen: Soft/ND/NT, positive BS  Musculoskeletal: bilateral pedal pitting Edema  Neuro: alert, oriented   Data Reviewed: Basic Metabolic Panel: Recent Labs  Lab 04/29/18 0352 04/30/18 0340 05/01/18 0257  NA 140 141 139  K 3.4* 4.5 4.6  CL 99 101 100  CO2 34* 29 29  GLUCOSE 106* 150* 150*  BUN 7* 10 13  CREATININE 0.74 0.61 0.63  CALCIUM 7.9* 8.4* 8.5*   Liver Function Tests: No results for input(s): AST, ALT, ALKPHOS, BILITOT, PROT, ALBUMIN in the last 168 hours. No results for input(s): LIPASE, AMYLASE in the last 168 hours. No results for input(s): AMMONIA in the last 168 hours. CBC: Recent Labs  Lab 04/29/18 0352  WBC 5.6  HGB 11.7*  HCT 39.4  MCV 92.3  PLT 153   Cardiac Enzymes:   No results for input(s): CKTOTAL, CKMB, CKMBINDEX, TROPONINI in the last 168 hours. BNP (  last 3 results) Recent Labs    03/01/18 0701 04/27/18 1305  BNP 33.9 37.3    ProBNP (last 3 results) No results for input(s): PROBNP in the last 8760 hours.  CBG: Recent Labs  Lab 04/29/18 0747 04/29/18 1201  GLUCAP 92 102*    Recent Results (from the past 240 hour(s))  Culture, blood (routine x 2)     Status: None   Collection Time: 04/27/18  1:05 PM  Result Value Ref Range Status   Specimen Description   Final     BLOOD LEFT FOREARM Performed at [facility] [address], [place], [place] [address]    Special Requests   Final    BOTTLES DRAWN AEROBIC AND ANAEROBIC Blood Culture adequate volume Performed at [facility] [address], [place], [place] [address]    Culture   Final    NO GROWTH 5 DAYS Performed at [facility], [place] [address] [place], [place] [address]    Report Status 05/02/2018 FINAL  Final  Culture, blood (routine x 2)     Status: None   Collection Time: 04/27/18  4:42 PM  Result Value Ref Range Status   Specimen Description   Final    BLOOD RIGHT ARM Performed at [facility] [address] [place], [place] [address]    Special Requests   Final    BOTTLES DRAWN AEROBIC ONLY Blood Culture adequate volume Performed at [facility] [address] [place], [place] [address]    Culture   Final    NO GROWTH 5 DAYS Performed at [facility], [place] [address], [place], [place] [address]    Report Status 05/02/2018 FINAL  Final  MRSA PCR Screening     Status: None   Collection Time: 05/01/18 12:34 PM  Result Value Ref Range Status   MRSA by PCR NEGATIVE NEGATIVE Final    Comment:        The GeneXpert MRSA Assay (FDA approved for NASAL specimens only), is one component of a comprehensive MRSA colonization surveillance program. It is not intended to diagnose MRSA infection nor to guide or monitor treatment for MRSA infections. Performed at [facility], [place] [address] [place], [place] [address]      Studies: No results found.  Scheduled Meds: . amoxicillin-clavulanate  1 tablet Oral Q12H  . apixaban  5 mg Oral BID  . carvedilol  3.125 mg Oral BID WC  . chlorhexidine  15 mL Mouth Rinse BID  . dexamethasone  12 mg Intravenous Q12H  . furosemide  40 mg Intravenous Once  . guaiFENesin  600 mg Oral BID  . ipratropium-albuterol  3 mL Nebulization Q6H  .  mouth rinse  15 mL Mouth Rinse q12n4p  . polyethylene glycol  17 g Oral Daily  . senna-docusate  1 tablet Oral QHS  . sodium chloride  2 spray Each Nare Q6H    Continuous Infusions: . sodium chloride Stopped (04/28/18 0005)     Time spent: 70mins, case discussed with palliative care Dr Rowe Pavy. I have personally reviewed and interpreted on  05/05/2018 daily labs, tele strips, imagings as discussed above under date review session and assessment and plans.  I reviewed all nursing notes, pharmacy notes, consultant notes,  vitals, pertinent old records  I have discussed plan of care as described above with RN , patient  on 05/05/2018   Florencia Reasons MD, PhD  [facility] Pager [phone]. If  7PM-7AM, please contact night-coverage at [email or web address], password [facility] 05/05/2018, 8:54 AM  LOS: 8 days

## 2018-05-06 MED ORDER — FUROSEMIDE 10 MG/ML IJ SOLN
40.0000 mg | Freq: Once | INTRAMUSCULAR | Status: AC
  Administered 2018-05-06: 40 mg via INTRAVENOUS
  Filled 2018-05-06: qty 4

## 2018-05-06 MED ORDER — IPRATROPIUM-ALBUTEROL 0.5-2.5 (3) MG/3ML IN SOLN
3.0000 mL | Freq: Three times a day (TID) | RESPIRATORY_TRACT | Status: DC
  Administered 2018-05-06 – 2018-05-15 (×27): 3 mL via RESPIRATORY_TRACT
  Filled 2018-05-06 (×29): qty 3

## 2018-05-06 NOTE — Clinical Social Work Note (Signed)
Clinical Social Work Assessment  Patient Details  Name: Bradley Maldonado MRN: [id number] Date of Birth: 1950/07/26  Date of referral:  05/05/18               Reason for consult:  End of Life/Hospice                Permission sought to share information with:  Chartered certified accountant granted to share information::  Yes, Verbal Permission Granted  Name::        Agency::  [facility]  Relationship::     Contact Information:     Housing/Transportation Living arrangements for the past 2 months:  Single Family Home Source of Information:  Patient Patient Interpreter Needed:  None Criminal Activity/Legal Involvement Pertinent to Current Situation/Hospitalization:  No - Comment as needed Significant Relationships:  Other Family Members, Adult Children Lives with:  Self Do you feel safe going back to the place where you live?  Yes Need for family participation in patient care:  No (Coment)  Care giving concerns:  Patient has life limiting illness: Acute respiratory failure with hypoxemia due to advanced adenocarcinoma of the right lung. Hx of brain mets s/p palliative chemo and radiation. Palliative recommending residential hospice.   Social Worker assessment / plan:  CSW met with patient at bedside to discuss residential hospice recommendation. Patient reluctant to speak with CSW regarding this topic. States "I don't want to talk about that right now." Asked permission to send referral to residential hospice so that they would be able to come speak with him to explain his options more.  Patient agreeable. Patient asked where [facility] are. CSW provided addresses. Patient agreeable to [facility] but "I'm not making any promises."   Patient agreeable to speaking with [facility] liaison on Monday 12/16.  CSW will make referral.   Employment status:  Retired Nurse, adult PT Recommendations:  Not assessed at this  time Information / Referral to community resources:     Patient/Family's Response to care:  Patient was reluctant to speak with CSW but agreeable to hospice referral.  Patient/Family's Understanding of and Emotional Response to Diagnosis, Current Treatment, and Prognosis:  Patient understands his options but prefers to return home.   Emotional Assessment Appearance:  Appears stated age Attitude/Demeanor/Rapport:    Affect (typically observed):  Apprehensive Orientation:  Oriented to Self, Oriented to Place, Oriented to  Time, Oriented to Situation Alcohol / Substance use:  Not Applicable Psych involvement (Current and /or in the community):  No (Comment)  Discharge Needs  Concerns to be addressed:  Care Coordination Readmission within the last 30 days:  No Current discharge risk:  Terminally ill Barriers to Discharge:  Continued Medical Work up   [employer], [place] 05/06/2018, 11:25 AM

## 2018-05-06 NOTE — Progress Notes (Signed)
Daily Progress Note   Patient Name: Bradley Maldonado       Date: 05/06/2018 DOB: June 30, 1950  Age: 67 y.o. MRN#: [id number] Attending Physician: Florencia Reasons, MD Primary Care Physician: [name], NP-C Admit Date: 04/27/2018  Reason for Consultation/Follow-up: Establishing goals of care  Subjective:  patient is awake, resting in bed I discussed with the patient regarding residential hospice. We reviewed that he is still with high O2 requirements and for him to consider residential hospice.      PPS 30%     See below   Length of Stay: 9  Current Medications: Scheduled Meds:  . amoxicillin-clavulanate  1 tablet Oral Q12H  . apixaban  5 mg Oral BID  . carvedilol  3.125 mg Oral BID WC  . chlorhexidine  15 mL Mouth Rinse BID  . dexamethasone  12 mg Intravenous Q12H  . guaiFENesin  600 mg Oral BID  . ipratropium-albuterol  3 mL Nebulization TID  . mouth rinse  15 mL Mouth Rinse q12n4p  . polyethylene glycol  17 g Oral Daily  . senna-docusate  1 tablet Oral QHS  . sodium chloride  2 spray Each Nare Q6H    Continuous Infusions: . sodium chloride Stopped (04/28/18 0005)    PRN Meds: sodium chloride, acetaminophen **OR** acetaminophen, ipratropium-albuterol, morphine injection, ondansetron **OR** ondansetron (ZOFRAN) IV, sodium chloride  Physical Exam         Gen: chronically ill appearing, temporal wasting  neck supple PULM: No air movement on the right, CTA L.    CV:   trace edema GI: BS+, soft, nontender  Psyche: normal mood and affect, but avoidant in nature, doesn't discuss Appears more frail and weak  Vital Signs: BP 130/78 (BP Location: Left Arm)   Pulse 77   Temp 98.2 F (36.8 C) (Oral)   Resp 18   Ht 5\' 11"  (1.803 m)   Wt 67.8 kg   SpO2 96%   BMI 20.85  kg/m  SpO2: SpO2: 96 % O2 Device: O2 Device: High Flow Nasal Cannula O2 Flow Rate: O2 Flow Rate (L/min): 25 L/min  Intake/output summary:   Intake/Output Summary (Last 24 hours) at 05/06/2018 1354 Last data filed at 05/06/2018 0500 Gross per 24 hour  Intake -  Output 575 ml  Net -575 ml   LBM: Last BM  Date: 05/04/18 Baseline Weight: Weight: 68.9 kg Most recent weight: Weight: 67.8 kg       Palliative Assessment/Data: PPS 30%   Flowsheet Rows     Most Recent Value  Intake Tab  Referral Department  Hospitalist  Unit at Time of Referral  ICU  Palliative Care Primary Diagnosis  Cancer  Date Notified  04/28/18  Palliative Care Type  Return patient Palliative Care  Reason for referral  Clarify Goals of Care  Date of Admission  04/27/18  Date first seen by Palliative Care  04/28/18  # of days Palliative referral response time  0 Day(s)  # of days IP prior to Palliative referral  1  Clinical Assessment  Palliative Performance Scale Score  50%  Pain Max last 24 hours  0  Pain Min Last 24 hours  0  Dyspnea Max Last 24 Hours  8  Dyspnea Min Last 24 hours  3  Psychosocial & Spiritual Assessment  Palliative Care Outcomes  Patient/Family meeting held?  Yes  Who was at the meeting?  patient  Palliative Care Outcomes  Clarified goals of care      Patient Active Problem List   Diagnosis Date Noted  . Shortness of breath   . Palliative care by specialist   . Acute on chronic respiratory failure with hypoxia ([place]) 04/28/2018  . Lobar pneumonia ([place]) 04/28/2018  . Chronic systolic CHF (congestive heart failure) ([place]) 04/28/2018  . History of pulmonary embolism ([place]) 04/28/2018  . Encounter for antineoplastic chemotherapy 04/04/2018  . Brain metastases ([place]) 03/14/2018  . Acute combined systolic and diastolic heart failure ([place]) 03/05/2018  . Recurrent right pleural effusion 03/01/2018  . Encounter for antineoplastic immunotherapy 02/15/2017  . Adenocarcinoma of right lung,  stage 4 ([place]) 01/24/2017  . Goals of care, counseling/discussion 01/24/2017  . Status post thoracentesis   . Chronic obstructive pulmonary disease ([place])   . Lung cancer ([place]) 12/20/2016  . Pleural effusion, malignant 12/17/2016  . Hypertension 12/17/2016  . History of right hip replacement 12/17/2016    Palliative Care Assessment & Plan   Patient Profile:    Assessment: Life limiting illness:  Acute respiratory failure with hypoxemia due to advanced adenocarcinoma of the right lung (complete obliteration of the R lung) and emphysema of left and PE of left.   History of brain mets s/p palliative chemo and radiation.  Chronic systolic heart failure with EF 25%   Recommendations/Plan:  continue current mode of care Recommend residential hospice, CSW now involved.  Continue  low dose PRN IV opioids for symptom management of pain/dyspnea. Monitor needs.       Code Status: Now DNR DNI as of 05-03-18.      Code Status Orders  (From admission, onward)         Start     Ordered   04/27/18 1614  Full code  Continuous     04/27/18 1613        Code Status History    Date Active Date Inactive Code Status Order ID Comments User Context   03/01/2018 1559 03/06/2018 1510 Full Code [id number]  [name], MD Inpatient   12/17/2016 1707 12/21/2016 2036 Full Code [id number]  Shela Leff, MD ED       Prognosis:   guarded less than 2 weeks.   Discharge Planning:    transfer to residential hospice.   Care plan was discussed with  Patient, bedside RN and uncle.     Thank you for allowing the Palliative Medicine Team  to assist in the care of this patient.   Time In: 10 Time Out: 10.25 Total Time 25 Prolonged Time Billed  no       Greater than 50%  of this time was spent counseling and coordinating care related to the above assessment and plan.  Loistine Chance, MD [phone] Please contact Palliative Medicine Team phone at [phone] for questions and concerns.

## 2018-05-06 NOTE — Progress Notes (Signed)
PROGRESS NOTE  Bradley Maldonado DQQ:[id number] DOB: [birth year] DOA: 04/27/2018 PCP: [name], NP-C  HPI/Recap of past 24 hours:  1.6liter urine documented last 24hrs after the lasix, vital signs are stable Per RN no desats last night,  Patient denies pain, appear comfortable No fever  Assessment/Plan: Active Problems:   Hypertension   Brain metastases (HCC)   Acute on chronic respiratory failure with hypoxia (HCC)   Lobar pneumonia (HCC)   Chronic systolic CHF (congestive heart failure) (HCC)   History of pulmonary embolism (HCC)   Shortness of breath   Palliative care by specialist   Acute respiratory failure with hypoxemia due to advanced adenocarcinoma of the right lung (complete obliteration of the R lung) with brain mets / emphysema/copd and PE of left.  -he presented with dyspnea, CT on presentation "showed progressive diffuse infiltrated malignancy" -pulmonary/critical care consulted, recommend hospice placement -he is currently on nebs/steroids/abx/eliquis/HFNC - patient is DNR,  he agreed to think about residental hospice placement,  -wean oxygen  BILATERAL globe proptosis, without orbital mass. Significance uncertain  on CT orbit from 02/2018 He denies vision problem, no eye pain   Chronic systolic heart failure: lvef 25-30% from echo done in 02/2018 -with mild pedal edema currently -Continue low dose coreg, prn lasix if bp allows, give iv lasix 40mg  x1 on 12/14 with good result, will give another dose lasix 40 to facilitate oxygen weaning -monitor volume status -DNR , residential hospice placement   Code Status: DNR  Family Communication: patient   Disposition Plan: In-hospital death versus residential hospice   Consultants:  Pulmonary /critical care  Palliative care  Procedures:  none  Antibiotics:  Zosyn from 12/6 to 12/14, change to augmentin   Objective: BP 108/65   Pulse 77   Temp 98 F (36.7 C) (Oral)   Resp 14   Ht 5'  11" (1.803 m)   Wt 67.8 kg   SpO2 100%   BMI 20.85 kg/m   Intake/Output Summary (Last 24 hours) at 05/06/2018 0853 Last data filed at 05/06/2018 0500 Gross per 24 hour  Intake 50 ml  Output 1675 ml  Net -1625 ml   Filed Weights   04/27/18 1212 04/27/18 1616  Weight: 68.9 kg 67.8 kg    Exam: Patient is examined daily including today on 05/06/2018, exams remain the same as of yesterday except that has changed    General:  Frail, chronically ill appearing, exophthalmus, left eye appear to have limited range of motion, denies eye pain or blurry vision  Cardiovascular: RRR  Respiratory: diminished, less rhonchi, +rales , no wheezing  Abdomen: Soft/ND/NT, positive BS  Musculoskeletal: less bilateral pedal pitting Edema  Neuro: alert, oriented   Data Reviewed: Basic Metabolic Panel: Recent Labs  Lab 04/30/18 0340 05/01/18 0257  NA 141 139  K 4.5 4.6  CL 101 100  CO2 29 29  GLUCOSE 150* 150*  BUN 10 13  CREATININE 0.61 0.63  CALCIUM 8.4* 8.5*   Liver Function Tests: No results for input(s): AST, ALT, ALKPHOS, BILITOT, PROT, ALBUMIN in the last 168 hours. No results for input(s): LIPASE, AMYLASE in the last 168 hours. No results for input(s): AMMONIA in the last 168 hours. CBC: No results for input(s): WBC, NEUTROABS, HGB, HCT, MCV, PLT in the last 168 hours. Cardiac Enzymes:   No results for input(s): CKTOTAL, CKMB, CKMBINDEX, TROPONINI in the last 168 hours. BNP (last 3 results) Recent Labs    03/01/18 0701 04/27/18 1305  BNP 33.9 37.3  ProBNP (last 3 results) No results for input(s): PROBNP in the last 8760 hours.  CBG: Recent Labs  Lab 04/29/18 1201  GLUCAP 102*    Recent Results (from the past 240 hour(s))  Culture, blood (routine x 2)     Status: None   Collection Time: 04/27/18  1:05 PM  Result Value Ref Range Status   Specimen Description   Final    BLOOD LEFT FOREARM Performed at [facility] [address],  [place], [place] [address]    Special Requests   Final    BOTTLES DRAWN AEROBIC AND ANAEROBIC Blood Culture adequate volume Performed at [facility] [address], [place], [place] [address]    Culture   Final    NO GROWTH 5 DAYS Performed at [facility], [place] [address] [place], [place] [address]    Report Status 05/02/2018 FINAL  Final  Culture, blood (routine x 2)     Status: None   Collection Time: 04/27/18  4:42 PM  Result Value Ref Range Status   Specimen Description   Final    BLOOD RIGHT ARM Performed at [facility] [address], [place], [place] [address]    Special Requests   Final    BOTTLES DRAWN AEROBIC ONLY Blood Culture adequate volume Performed at [facility] [address], [place], [place] [address]    Culture   Final    NO GROWTH 5 DAYS Performed at [facility], [place] [address] [place], [place] [address]    Report Status 05/02/2018 FINAL  Final  MRSA PCR Screening     Status: None   Collection Time: 05/01/18 12:34 PM  Result Value Ref Range Status   MRSA by PCR NEGATIVE NEGATIVE Final    Comment:        The GeneXpert MRSA Assay (FDA approved for NASAL specimens only), is one component of a comprehensive MRSA colonization surveillance program. It is not intended to diagnose MRSA infection nor to guide or monitor treatment for MRSA infections. Performed at [facility], [place] [address], [place], [place] [address]      Studies: No results found.  Scheduled Meds: . amoxicillin-clavulanate  1 tablet Oral Q12H  . apixaban  5 mg Oral BID  . carvedilol  3.125 mg Oral BID WC  . chlorhexidine  15 mL Mouth Rinse BID  . dexamethasone  12 mg Intravenous Q12H  . guaiFENesin  600 mg Oral BID  . ipratropium-albuterol  3 mL Nebulization TID  . mouth rinse  15 mL Mouth Rinse q12n4p  . polyethylene glycol  17 g Oral Daily  . senna-docusate  1 tablet Oral QHS  . sodium  chloride  2 spray Each Nare Q6H    Continuous Infusions: . sodium chloride Stopped (04/28/18 0005)     Time spent: 83mins, I have personally reviewed and interpreted on  05/06/2018 daily labs, tele strips, imagings as discussed above under date review session and assessment and plans.  I reviewed all nursing notes, pharmacy notes, consultant notes,  vitals, pertinent old records  I have discussed plan of care as described above with RN , patient  on 05/06/2018   Florencia Reasons MD, PhD  [facility] Pager [phone]. If 7PM-7AM, please contact night-coverage at [email or web address], password [facility] 05/06/2018, 8:53 AM  LOS: 9 days

## 2018-05-07 ENCOUNTER — Encounter: Payer: Self-pay | Admitting: Radiation Oncology

## 2018-05-07 NOTE — Progress Notes (Signed)
PROGRESS NOTE  ALMUS WOODHAM GYF:[id number] DOB: [birth year] DOA: 04/27/2018 PCP: [name], NP-C  HPI/Recap of past 24 hours:  On 9liter HFNC, intermittent congested cough Uneventful night,   Patient denies pain, appear comfortable No fever  Sister at bedside  Assessment/Plan: Active Problems:   Hypertension   Brain metastases (HCC)   Acute on chronic respiratory failure with hypoxia (HCC)   Lobar pneumonia (HCC)   Chronic systolic CHF (congestive heart failure) (HCC)   History of pulmonary embolism (HCC)   Shortness of breath   Palliative care by specialist   Acute respiratory failure with hypoxemia due to advanced adenocarcinoma of the right lung (complete obliteration of the R lung) with brain mets / emphysema/copd and PE of left.  -he presented with dyspnea, CT on presentation "showed progressive diffuse infiltrated malignancy" -pulmonary/critical care consulted, recommend hospice placement -he is currently on nebs/steroids/abx/eliquis/HFNC - patient is DNR,  he agreed to think about residental hospice placement,  -wean oxygen as able, currently on HFNC 9liters  BILATERAL globe proptosis, without orbital mass. Significance uncertain  on CT orbit from 02/2018 He denies vision problem, no eye pain   Chronic systolic heart failure: lvef 25-30% from echo done in 02/2018 -with mild pedal edema currently -Continue low dose coreg, prn lasix if bp allows, give iv lasix 40mg  x1 on 12/14 with good result, will give another dose lasix 40 to facilitate oxygen weaning -monitor volume status -DNR , residential hospice placement   Code Status: DNR  Family Communication: patient and sister  Disposition Plan: In-hospital death versus residential hospice   Consultants:  Pulmonary /critical care  Palliative care  Procedures:  none  Antibiotics:  Zosyn from 12/6 to 12/14, change to augmentin   Objective: BP (!) 98/57 (BP Location: Left Arm)   Pulse 90    Temp 98.7 F (37.1 C) (Oral)   Resp 20   Ht 5\' 11"  (1.803 m)   Wt 67.8 kg   SpO2 97% Comment: md goal >=88%  BMI 20.85 kg/m   Intake/Output Summary (Last 24 hours) at 05/07/2018 1338 Last data filed at 05/07/2018 0800 Gross per 24 hour  Intake 60 ml  Output 950 ml  Net -890 ml   Filed Weights   04/27/18 1212 04/27/18 1616  Weight: 68.9 kg 67.8 kg    Exam: Patient is examined daily including today on 05/07/2018, exams remain the same as of yesterday except that has changed    General:  Frail, chronically ill appearing, exophthalmus, left eye appear to have limited range of motion, denies eye pain or blurry vision  Cardiovascular: RRR  Respiratory: diminished, + rhonchi, +rales , no wheezing  Abdomen: Soft/ND/NT, positive BS  Musculoskeletal: less bilateral pedal pitting Edema  Neuro: alert, oriented   Data Reviewed: Basic Metabolic Panel: Recent Labs  Lab 05/01/18 0257  NA 139  K 4.6  CL 100  CO2 29  GLUCOSE 150*  BUN 13  CREATININE 0.63  CALCIUM 8.5*   Liver Function Tests: No results for input(s): AST, ALT, ALKPHOS, BILITOT, PROT, ALBUMIN in the last 168 hours. No results for input(s): LIPASE, AMYLASE in the last 168 hours. No results for input(s): AMMONIA in the last 168 hours. CBC: No results for input(s): WBC, NEUTROABS, HGB, HCT, MCV, PLT in the last 168 hours. Cardiac Enzymes:   No results for input(s): CKTOTAL, CKMB, CKMBINDEX, TROPONINI in the last 168 hours. BNP (last 3 results) Recent Labs    03/01/18 0701 04/27/18 1305  BNP 33.9 37.3  ProBNP (last 3 results) No results for input(s): PROBNP in the last 8760 hours.  CBG: No results for input(s): GLUCAP in the last 168 hours.  Recent Results (from the past 240 hour(s))  Culture, blood (routine x 2)     Status: None   Collection Time: 04/27/18  4:42 PM  Result Value Ref Range Status   Specimen Description   Final    BLOOD RIGHT ARM Performed at [facility] [address], [place], [place] [address]    Special Requests   Final    BOTTLES DRAWN AEROBIC ONLY Blood Culture adequate volume Performed at [facility] [address] [place], [place] [address]    Culture   Final    NO GROWTH 5 DAYS Performed at [facility], [place] [address], [place], [place] [address]    Report Status 05/02/2018 FINAL  Final  MRSA PCR Screening     Status: None   Collection Time: 05/01/18 12:34 PM  Result Value Ref Range Status   MRSA by PCR NEGATIVE NEGATIVE Final    Comment:        The GeneXpert MRSA Assay (FDA approved for NASAL specimens only), is one component of a comprehensive MRSA colonization surveillance program. It is not intended to diagnose MRSA infection nor to guide or monitor treatment for MRSA infections. Performed at [facility], [place] [address] [place], [place] [address]      Studies: No results found.  Scheduled Meds: . amoxicillin-clavulanate  1 tablet Oral Q12H  . apixaban  5 mg Oral BID  . carvedilol  3.125 mg Oral BID WC  . chlorhexidine  15 mL Mouth Rinse BID  . dexamethasone  12 mg Intravenous Q12H  . guaiFENesin  600 mg Oral BID  . ipratropium-albuterol  3 mL Nebulization TID  . mouth rinse  15 mL Mouth Rinse q12n4p  . polyethylene glycol  17 g Oral Daily  . senna-docusate  1 tablet Oral QHS  . sodium chloride  2 spray Each Nare Q6H    Continuous Infusions: . sodium chloride Stopped (04/28/18 0005)     Time spent: 81mins, case discussed with hospice liaison  I have personally reviewed and interpreted on  05/07/2018 daily labs, tele strips, imagings as discussed above under date review session and assessment and plans.  I reviewed all nursing notes, pharmacy notes, consultant notes,  vitals, pertinent old records  I have discussed plan of care as described above with RN , patient  And family on 05/07/2018   Florencia Reasons MD, PhD  [facility] Pager [phone]. If  7PM-7AM, please contact night-coverage at [email or web address], password [facility] 05/07/2018, 1:38 PM  LOS: 10 days

## 2018-05-07 NOTE — Progress Notes (Signed)
Daily Progress Note   Patient Name: Bradley Maldonado       Date: 05/07/2018 DOB: 1950/11/02  Age: 67 y.o. MRN#: [id number] Attending Physician: Florencia Reasons, MD Primary Care Physician: [name], NP-C Admit Date: 04/27/2018  Reason for Consultation/Follow-up: Establishing goals of care  Subjective:  patient is awake, resting in bed I discussed with the patient regarding residential hospice. [name] accepting of going to hospice, if they have beds tomorrow His friend and co worker is at the bedside    PPS 30%     See below   Length of Stay: 10  Current Medications: Scheduled Meds:  . amoxicillin-clavulanate  1 tablet Oral Q12H  . apixaban  5 mg Oral BID  . carvedilol  3.125 mg Oral BID WC  . chlorhexidine  15 mL Mouth Rinse BID  . dexamethasone  12 mg Intravenous Q12H  . guaiFENesin  600 mg Oral BID  . ipratropium-albuterol  3 mL Nebulization TID  . mouth rinse  15 mL Mouth Rinse q12n4p  . polyethylene glycol  17 g Oral Daily  . senna-docusate  1 tablet Oral QHS  . sodium chloride  2 spray Each Nare Q6H    Continuous Infusions: . sodium chloride Stopped (04/28/18 0005)    PRN Meds: sodium chloride, acetaminophen **OR** acetaminophen, ipratropium-albuterol, morphine injection, ondansetron **OR** ondansetron (ZOFRAN) IV, sodium chloride  Physical Exam         Gen: chronically ill appearing, temporal wasting  neck supple PULM: No air movement on the right, CTA L.    CV:   trace edema GI: BS+, soft, nontender  Psyche: normal mood and affect,   Appears more frail and weak  Vital Signs: BP (!) 107/51 (BP Location: Left Arm)   Pulse 90   Temp 98.7 F (37.1 C) (Oral)   Resp (!) 24   Ht 5\' 11"  (1.803 m)   Wt 67.8 kg   SpO2 97% Comment: md goal >=88%  BMI 20.85  kg/m  SpO2: SpO2: 97 %(md goal >=88%) O2 Device: O2 Device: High Flow Nasal Cannula([place]) O2 Flow Rate: O2 Flow Rate (L/min): 9 L/min(decreased to 8 lpm - was 96% on 9 lpm - rn aware)  Intake/output summary:   Intake/Output Summary (Last 24 hours) at 05/07/2018 1535 Last data filed at 05/07/2018 1200 Gross per 24  hour  Intake 180 ml  Output 950 ml  Net -770 ml   LBM: Last BM Date: 05/06/18 Baseline Weight: Weight: 68.9 kg Most recent weight: Weight: 67.8 kg       Palliative Assessment/Data: PPS 30%   Flowsheet Rows     Most Recent Value  Intake Tab  Referral Department  Hospitalist  Unit at Time of Referral  ICU  Palliative Care Primary Diagnosis  Cancer  Date Notified  04/28/18  Palliative Care Type  Return patient Palliative Care  Reason for referral  Clarify Goals of Care  Date of Admission  04/27/18  Date first seen by Palliative Care  04/28/18  # of days Palliative referral response time  0 Day(s)  # of days IP prior to Palliative referral  1  Clinical Assessment  Palliative Performance Scale Score  50%  Pain Max last 24 hours  0  Pain Min Last 24 hours  0  Dyspnea Max Last 24 Hours  8  Dyspnea Min Last 24 hours  3  Psychosocial & Spiritual Assessment  Palliative Care Outcomes  Patient/Family meeting held?  Yes  Who was at the meeting?  patient  Palliative Care Outcomes  Clarified goals of care      Patient Active Problem List   Diagnosis Date Noted  . Shortness of breath   . Palliative care by specialist   . Acute on chronic respiratory failure with hypoxia ([place]) 04/28/2018  . Lobar pneumonia ([place]) 04/28/2018  . Chronic systolic CHF (congestive heart failure) ([place]) 04/28/2018  . History of pulmonary embolism ([place]) 04/28/2018  . Encounter for antineoplastic chemotherapy 04/04/2018  . Brain metastases ([place]) 03/14/2018  . Acute combined systolic and diastolic heart failure ([place]) 03/05/2018  . Recurrent right pleural effusion 03/01/2018  . Encounter  for antineoplastic immunotherapy 02/15/2017  . Adenocarcinoma of right lung, stage 4 ([facility]) 01/24/2017  . Goals of care, counseling/discussion 01/24/2017  . Status post thoracentesis   . Chronic obstructive pulmonary disease ([place])   . Lung cancer ([place]) 12/20/2016  . Pleural effusion, malignant 12/17/2016  . Hypertension 12/17/2016  . History of right hip replacement 12/17/2016    Palliative Care Assessment & Plan   Patient Profile:    Assessment: Life limiting illness:  Acute respiratory failure with hypoxemia due to advanced adenocarcinoma of the right lung (complete obliteration of the R lung) and emphysema of left and PE of left.   History of brain mets s/p palliative chemo and radiation.  Chronic systolic heart failure with EF 25%   Recommendations/Plan:  continue current mode of care Recommend residential hospice, CSW now involved. Appreciate [facility] consult.  Continue  low dose PRN IV opioids for symptom management of pain/dyspnea. Monitor needs.  Patient willing to go to hospice house on 05-08-18, he says."but that ain't gonna be my last stop,ok?" acknowledged that our goals are for him to be as comfortable as possible, and that is best served in a hospice setting, at this point, once he reaches the end of this hospitalization.       Code Status:   DNR DNI as of 05-03-18.      Code Status Orders  (From admission, onward)         Start     Ordered   04/27/18 1614  Full code  Continuous     04/27/18 1613        Code Status History    Date Active Date Inactive Code Status Order ID Comments User Context   03/01/2018 1559  03/06/2018 1510 Full Code [id number]  [name], MD Inpatient   12/17/2016 1707 12/21/2016 2036 Full Code [id number]  Shela Leff, MD ED       Prognosis:   guarded less than 2 weeks.   Discharge Planning:    transfer to residential hospice.   Care plan was discussed with  Patient, bedside RN and friend     Thank you for allowing the  Palliative Medicine Team to assist in the care of this patient.   Time In: 1500 Time Out: 15.25 Total Time 25 Prolonged Time Billed  no       Greater than 50%  of this time was spent counseling and coordinating care related to the above assessment and plan.  Loistine Chance, MD [phone] Please contact Palliative Medicine Team phone at [phone] for questions and concerns.

## 2018-05-07 NOTE — Progress Notes (Signed)
Added sterile water to high flow nasal cannula system ([place])- uneventful.

## 2018-05-07 NOTE — Progress Notes (Addendum)
[facility] [facility])  Received referral for [facility].  Met with patient at the bedside, explained [facility] services and specifically [facility].  Pt understands that he is very sick, however he does not feel he is ready to go to [facility].  He spoke as if he could go to [facility], then return later to his home.  Explained this is not the purpose of residential hospice, rather for the last few weeks of life.  He was not ready to make this decision today.    [facility] is able to provide care for him with his current O2 requirements (9L HFNC)  Asked if I could return tomorrow after he had time to discuss with family, he was agreeable to this.  Please call with any questions or concerns, Venia Carbon BSN, RN [facility] Liaison (listed in [place]) [phone]

## 2018-05-08 NOTE — Progress Notes (Signed)
[facility] [place] ([facility])  Met with pt at bedside, updated him on his plan of care and the discussion that he had with palliative medicine.  Advised him that we had a bed available for him.  He states that he would like to get some physical therapy and be more mobile before he transfers anywhere.  Reiterated the purpose of [facility] and what that means in the scope of his diagnosis.  He states he would consider going on Monday.  We also discussed him going home and the challenges associated with that, he feels that he could manage himself at home.  He did not want to discuss any further, but said I could check on him daily.  Please let me know if I can be of any assistance with his decision making.  Venia Carbon BSN, RN  [facility] Liaison (listed in [place]

## 2018-05-08 NOTE — Progress Notes (Signed)
Added Sterile [place] to HFNC [place] 02 system- uneventful.

## 2018-05-08 NOTE — Progress Notes (Signed)
PROGRESS NOTE  Bradley Maldonado JKK:[id number] DOB: December 28, 1950 DOA: 04/27/2018 PCP: [name], NP-C  HPI/Recap of past 24 hours:  On 6liter HFNC, intermittent congested cough Uneventful night,   Patient denies pain, appear comfortable No fever  He has not make up his mind when he wants to go to residential hospice, he states he will continue to  think about it.   Aunt at bedside with permission  Assessment/Plan: Active Problems:   Hypertension   Brain metastases (HCC)   Acute on chronic respiratory failure with hypoxia (HCC)   Lobar pneumonia (HCC)   Chronic systolic CHF (congestive heart failure) (HCC)   History of pulmonary embolism (HCC)   Shortness of breath   Palliative care by specialist   Acute respiratory failure with hypoxemia due to advanced adenocarcinoma of the right lung (complete obliteration of the R lung) with brain mets / emphysema/copd and PE of left.  -he presented with dyspnea, CT on presentation "showed progressive diffuse infiltrated malignancy" -pulmonary/critical care consulted, recommend hospice placement -he is currently on nebs/steroids/abx/eliquis/HFNC - patient is DNR,  he agreed to think about residental hospice placement,  -wean oxygen as able, currently on HFNC 7liters ( HFNC prohibit SNF placement)  BILATERAL globe proptosis, without orbital mass. Significance uncertain  on CT orbit from 02/2018 He denies vision problem, no eye pain   Chronic systolic heart failure: lvef 25-30% from echo done in 02/2018 --Continue low dose coreg, prn lasix if bp allows, give iv lasix 40mg  x1 on 12/14 with good result, will give another dose  Iv lasix 40x1 on 12/15 to facilitate oxygen weaning -monitor volume status -DNR , residential hospice placement   Code Status: DNR  Family Communication: patient and aunt at bedside with permission  Disposition Plan: In-hospital death versus residential hospice Can not go home or to SNF due to needing  24/7 care and HFNC.  Consultants:  Pulmonary /critical care  Palliative care  Hospice   Procedures:  none  Antibiotics:  Zosyn from 12/6 to 12/14, change to augmentin   Objective: BP (!) 102/55   Pulse 94   Temp 98.2 F (36.8 C) (Oral)   Resp (!) 22   Ht 5\' 11"  (1.803 m)   Wt 67.8 kg   SpO2 (!) 89%   BMI 20.85 kg/m   Intake/Output Summary (Last 24 hours) at 05/08/2018 1845 Last data filed at 05/08/2018 1811 Gross per 24 hour  Intake 360 ml  Output 950 ml  Net -590 ml   Filed Weights   04/27/18 1212 04/27/18 1616  Weight: 68.9 kg 67.8 kg    Exam: Patient is examined daily including today on 05/08/2018, exams remain the same as of yesterday except that has changed    General:  Frail, chronically ill appearing, exophthalmus, left eye appear to have limited range of motion, denies eye pain or blurry vision  Cardiovascular: RRR  Respiratory: diminished, + rhonchi, +rales , no wheezing  Abdomen: Soft/ND/NT, positive BS  Musculoskeletal: less bilateral pedal pitting Edema  Neuro: alert, oriented   Data Reviewed: Basic Metabolic Panel: No results for input(s): NA, K, CL, CO2, GLUCOSE, BUN, CREATININE, CALCIUM, MG, PHOS in the last 168 hours. Liver Function Tests: No results for input(s): AST, ALT, ALKPHOS, BILITOT, PROT, ALBUMIN in the last 168 hours. No results for input(s): LIPASE, AMYLASE in the last 168 hours. No results for input(s): AMMONIA in the last 168 hours. CBC: No results for input(s): WBC, NEUTROABS, HGB, HCT, MCV, PLT in the last 168 hours.  Cardiac Enzymes:   No results for input(s): CKTOTAL, CKMB, CKMBINDEX, TROPONINI in the last 168 hours. BNP (last 3 results) Recent Labs    03/01/18 0701 04/27/18 1305  BNP 33.9 37.3    ProBNP (last 3 results) No results for input(s): PROBNP in the last 8760 hours.  CBG: No results for input(s): GLUCAP in the last 168 hours.  Recent Results (from the past 240 hour(s))  MRSA PCR Screening      Status: None   Collection Time: 05/01/18 12:34 PM  Result Value Ref Range Status   MRSA by PCR NEGATIVE NEGATIVE Final    Comment:        The GeneXpert MRSA Assay (FDA approved for NASAL specimens only), is one component of a comprehensive MRSA colonization surveillance program. It is not intended to diagnose MRSA infection nor to guide or monitor treatment for MRSA infections. Performed at [facility], [place] [address] [place], [place] [address]      Studies: No results found.  Scheduled Meds: . amoxicillin-clavulanate  1 tablet Oral Q12H  . apixaban  5 mg Oral BID  . carvedilol  3.125 mg Oral BID WC  . chlorhexidine  15 mL Mouth Rinse BID  . dexamethasone  12 mg Intravenous Q12H  . guaiFENesin  600 mg Oral BID  . ipratropium-albuterol  3 mL Nebulization TID  . mouth rinse  15 mL Mouth Rinse q12n4p  . polyethylene glycol  17 g Oral Daily  . senna-docusate  1 tablet Oral QHS  . sodium chloride  2 spray Each Nare Q6H    Continuous Infusions: . sodium chloride Stopped (04/28/18 0005)     Time spent: 63mins, case discussed with hospice liaison  I have personally reviewed and interpreted on  05/08/2018 daily labs, tele strips, imagings as discussed above under date review session and assessment and plans.  I reviewed all nursing notes, pharmacy notes, consultant notes,  vitals, pertinent old records  I have discussed plan of care as described above with RN , patient  And family on 05/08/2018   Florencia Reasons MD, PhD  [facility] Pager [phone]. If 7PM-7AM, please contact night-coverage at [email or web address], password [facility] 05/08/2018, 6:45 PM  LOS: 11 days

## 2018-05-09 ENCOUNTER — Ambulatory Visit: Admission: RE | Admit: 2018-05-09 | Payer: Medicare Other | Source: Ambulatory Visit | Admitting: Radiation Oncology

## 2018-05-09 DIAGNOSIS — R0902 Hypoxemia: Secondary | ICD-10-CM

## 2018-05-09 HISTORY — DX: Personal history of irradiation: Z92.3

## 2018-05-09 LAB — BASIC METABOLIC PANEL
Anion gap: 6 (ref 5–15)
BUN: 16 mg/dL (ref 8–23)
CO2: 28 mmol/L (ref 22–32)
Calcium: 8.3 mg/dL — ABNORMAL LOW (ref 8.9–10.3)
Chloride: 105 mmol/L (ref 98–111)
Creatinine, Ser: 1.04 mg/dL (ref 0.61–1.24)
GFR calc Af Amer: >60 mL/min (ref >60)
GFR calc non Af Amer: >60 mL/min (ref >60)
GLUCOSE: 139 mg/dL — AB (ref 70–99)
Potassium: 4 mmol/L (ref 3.5–5.1)
Sodium: 139 mmol/L (ref 135–145)

## 2018-05-09 MED ORDER — FUROSEMIDE 10 MG/ML IJ SOLN
40.0000 mg | Freq: Once | INTRAMUSCULAR | Status: AC
  Administered 2018-05-09: 40 mg via INTRAVENOUS
  Filled 2018-05-09: qty 4

## 2018-05-09 NOTE — Progress Notes (Signed)
PROGRESS NOTE    Bradley Maldonado  WJX:[id number] DOB: [birth year] DOA: 04/27/2018 PCP: [name], NP-C    Brief Narrative: 67 year old with past medical history significant for stage IV non-small cell lung cancer, with brain metastasis treated with palliative palliative chemo and radiation, chronic systolic heart failure ejection fraction 25% and pulmonary embolism who presents with worsening shortness of breath, he has been bed ridden due to severe dyspnea, he was found to be hypoxic with oxygen saturation in the 80%. Patient was admitted with acute on chronic hypoxic respiratory failure.  He was treated with IV antibiotics, nebulizer treatment, he has received IV Lasix intermittently.  CT chest was concerning with progression of lung cancer. Patient was initially on BiPAP subsequently transition to nonrebreather mask and currently on high flow oxygen 8 L. Critical care recommending hospice.  Palliative care has been following and helping with patient care.  Some point patient agreed to residential hospice.   Today he agreed to go to residential hospice tomorrow.     Assessment & Plan:   Active Problems:   Hypertension   Brain metastases (HCC)   Acute on chronic respiratory failure with hypoxia (HCC)   Lobar pneumonia (HCC)   Chronic systolic CHF (congestive heart failure) (HCC)   History of pulmonary embolism (HCC)   Shortness of breath   Palliative care by specialist  Acute respiratory failure with hypoxemia due to advanced adenocarcinoma of the right lung (complete obliteration of the R lung) with brain mets / emphysema/copd and PE of left. ; -with brain mets / emphysema/copd and PE of left.  -he presented with dyspnea, CT on presentation "showed progressive diffuse infiltrated malignancy" -pulmonary/critical care consulted, recommend hospice placement -he is currently on nebs/steroids/abx/eliquis/HFNC. -still on high flow oxygen 8 L.  -agree to hospice maybe tomorrow.    -transfer to med-surgery.  -stop IV antibiotics.  -Will give one time dose of lasix today.   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 Significance uncertain  on CT orbit from 02/2018 He denies vision problem, no eye pain  Chronic Systolic Heart failure;  EF 25 --30 %  Continue with low dose coreg.  Will give a dose of IV lasix today.     RN Pressure Injury Documentation:    Malnutrition Type:      Malnutrition Characteristics:      Nutrition Interventions:     Estimated body mass index is 20.85 kg/m as calculated from the following:   Height as of this encounter: 5\' 11"  (1.803 m).   Weight as of this encounter: 67.8 kg.   DVT prophylaxis: (Leliquis Code Status: DNR Family Communication: care discussed with patient.  Disposition Plan:  Awaiting decision from patient to be transfer to Residential Hospice.   Consultants:   CCM Oncology  Palliative   Procedures;  none   Antimicrobials:  Received 12 days of antibiotics.    Subjective: [name] still SOB.  He might go to residential hospice tomorrow.   Objective: Vitals:   05/09/18 0400 05/09/18 0500 05/09/18 0600 05/09/18 0800  BP: 108/63  118/63   Pulse: 76 72 73   Resp: (!) 25 (!) 24 19   Temp:    (!) 97.5 F (36.4 C)  TempSrc:    Axillary  SpO2: (!) 88% 90% (!) 89%   Weight:      Height:        Intake/Output Summary (Last 24 hours) at 05/09/2018 0845 Last data filed at 05/09/2018 0544 Gross per 24 hour  Intake 400  ml  Output 1525 ml  Net -1125 ml   Filed Weights   04/27/18 1212 04/27/18 1616  Weight: 68.9 kg 67.8 kg    Examination:  General exam: thin appearing.  Respiratory system: bilateral ronchus Cardiovascular system: S1 & S2 heard, RRR. No JVD, murmurs, rubs, gallops or clicks. No pedal edema. Gastrointestinal system: Abdomen is nondistended, soft and nontender. No organomegaly or masses felt. Normal bowel sounds heard. Central nervous system: Alert and  oriented. No focal neurological deficits. Extremities: Symmetric 5 x 5 power. Skin: No rashes, lesions or ulcers   Data Reviewed: I have personally reviewed following labs and imaging studies  CBC: No results for input(s): WBC, NEUTROABS, HGB, HCT, MCV, PLT in the last 168 hours. Basic Metabolic Panel: No results for input(s): NA, K, CL, CO2, GLUCOSE, BUN, CREATININE, CALCIUM, MG, PHOS in the last 168 hours. GFR: Estimated Creatinine Clearance: 84.8 mL/min (by C-G formula based on SCr of 0.63 mg/dL). Liver Function Tests: No results for input(s): AST, ALT, ALKPHOS, BILITOT, PROT, ALBUMIN in the last 168 hours. No results for input(s): LIPASE, AMYLASE in the last 168 hours. No results for input(s): AMMONIA in the last 168 hours. Coagulation Profile: No results for input(s): INR, PROTIME in the last 168 hours. Cardiac Enzymes: No results for input(s): CKTOTAL, CKMB, CKMBINDEX, TROPONINI in the last 168 hours. BNP (last 3 results) No results for input(s): PROBNP in the last 8760 hours. HbA1C: No results for input(s): HGBA1C in the last 72 hours. CBG: No results for input(s): GLUCAP in the last 168 hours. Lipid Profile: No results for input(s): CHOL, HDL, LDLCALC, TRIG, CHOLHDL, LDLDIRECT in the last 72 hours. Thyroid Function Tests: No results for input(s): TSH, T4TOTAL, FREET4, T3FREE, THYROIDAB in the last 72 hours. Anemia Panel: No results for input(s): VITAMINB12, FOLATE, FERRITIN, TIBC, IRON, RETICCTPCT in the last 72 hours. Sepsis Labs: No results for input(s): PROCALCITON, LATICACIDVEN in the last 168 hours.  Recent Results (from the past 240 hour(s))  MRSA PCR Screening     Status: None   Collection Time: 05/01/18 12:34 PM  Result Value Ref Range Status   MRSA by PCR NEGATIVE NEGATIVE Final    Comment:        The GeneXpert MRSA Assay (FDA approved for NASAL specimens only), is one component of a comprehensive MRSA colonization surveillance program. It is  not intended to diagnose MRSA infection nor to guide or monitor treatment for MRSA infections. Performed at [facility], [place] [address] [place], [place] [address]          Radiology Studies: No results found.      Scheduled Meds: . amoxicillin-clavulanate  1 tablet Oral Q12H  . apixaban  5 mg Oral BID  . carvedilol  3.125 mg Oral BID WC  . chlorhexidine  15 mL Mouth Rinse BID  . dexamethasone  12 mg Intravenous Q12H  . furosemide  40 mg Intravenous Once  . guaiFENesin  600 mg Oral BID  . ipratropium-albuterol  3 mL Nebulization TID  . mouth rinse  15 mL Mouth Rinse q12n4p  . polyethylene glycol  17 g Oral Daily  . senna-docusate  1 tablet Oral QHS  . sodium chloride  2 spray Each Nare Q6H   Continuous Infusions: . sodium chloride Stopped (04/28/18 0005)     LOS: 12 days    Time spent: 35 minutes    Elmarie Shiley, MD [facility] Pager [phone]  If 7PM-7AM, please contact night-coverage [email or web address] Password [facility] 05/09/2018, 8:45  AM  

## 2018-05-09 NOTE — Plan of Care (Signed)
  Problem: Education: Goal: Knowledge of General Education information will improve Description: Including pain rating scale, medication(s)/side effects and non-pharmacologic comfort measures Outcome: Progressing   Problem: Health Behavior/Discharge Planning: Goal: Ability to manage health-related needs will improve Outcome: Not Progressing   

## 2018-05-09 NOTE — Progress Notes (Signed)
Patient pending transfer to [place].   LCSW met at bedside with patient.   LCSW discussed hospice placement. Patient acknowledged wanted hospice placement, but stated he was not ready to go today. LCSW checked for understanding and patients responses was " word on the street is, you go there to die" LCSW explained comfort care. Patient acknowledged purpose of hospice.   Patient states that he wants "to see how the night goes" and will consider transferring tomorrow.   If disposition changes to SNF, patient is on high flow oxygen and cannot go to SNF on more than 5L of oxygen.   LCSW will continue to follow for disposition.  Carolin Coy [place]

## 2018-05-09 NOTE — Care Management Note (Signed)
Case Management Note  Patient Details  Name: Bradley Maldonado MRN: [id number] Date of Birth: 20-May-1951  Subjective/Objective:                  Lung ca  Action/Plan: Hospice and palliative care has seen patient -per patient not ready for this/remains on high flow nasal o2 at 6 l/[place], po meds/not sure of dc deposition=at this time.  Expected Discharge Date:  05/08/18               Expected Discharge Plan:  Home/Self Care  In-House Referral:     Discharge planning Services  CM Consult  Post Acute Care Choice:    Choice offered to:     DME Arranged:    DME Agency:     HH Arranged:    HH Agency:     Status of Service:  In process, will continue to follow  If discussed at Long Length of Stay Meetings, dates discussed:    Additional Comments:  [name], RN 05/09/2018, 10:35 AM

## 2018-05-10 DIAGNOSIS — E440 Moderate protein-calorie malnutrition: Secondary | ICD-10-CM

## 2018-05-10 MED ORDER — FUROSEMIDE 40 MG PO TABS
40.0000 mg | ORAL_TABLET | Freq: Every day | ORAL | Status: DC
  Administered 2018-05-10 – 2018-05-15 (×6): 40 mg via ORAL
  Filled 2018-05-10 (×6): qty 1

## 2018-05-10 MED ORDER — ADULT MULTIVITAMIN W/MINERALS CH
1.0000 | ORAL_TABLET | Freq: Every day | ORAL | Status: DC
  Administered 2018-05-10 – 2018-05-15 (×6): 1 via ORAL
  Filled 2018-05-10 (×6): qty 1

## 2018-05-10 MED ORDER — ENSURE ENLIVE PO LIQD
237.0000 mL | Freq: Three times a day (TID) | ORAL | Status: DC
  Administered 2018-05-10 – 2018-05-15 (×11): 237 mL via ORAL

## 2018-05-10 NOTE — Progress Notes (Signed)
PROGRESS NOTE    Bradley Maldonado  GYJ:[id number] DOB: February 08, 1951 DOA: 04/27/2018 PCP: [name], NP-C    Brief Narrative: 67 year old with past medical history significant for stage IV non-small cell lung cancer, with brain metastasis treated with palliative palliative chemo and radiation, chronic systolic heart failure ejection fraction 25% and pulmonary embolism who presents with worsening shortness of breath, he has been bed ridden due to severe dyspnea, he was found to be hypoxic with oxygen saturation in the 80%. Patient was admitted with acute on chronic hypoxic respiratory failure.  He was treated with IV antibiotics, nebulizer treatment, he has received IV Lasix intermittently.  CT chest was concerning with progression of lung cancer. Patient was initially on BiPAP subsequently transition to nonrebreather mask and currently on high flow oxygen 8 L. Critical care recommending hospice.  Palliative care has been following and helping with patient care.  Some point patient agreed to residential hospice.       Assessment & Plan:   Active Problems:   Hypertension   Brain metastases (HCC)   Acute on chronic respiratory failure with hypoxia (HCC)   Lobar pneumonia (HCC)   Chronic systolic CHF (congestive heart failure) (HCC)   History of pulmonary embolism (HCC)   Shortness of breath   Palliative care by specialist   Malnutrition of moderate degree  Acute respiratory failure with hypoxemia due to advanced adenocarcinoma of the right lung (complete obliteration of the R lung) with brain mets / emphysema/copd and PE of left. ; -with brain mets / emphysema/copd and PE of left.  -he presented with dyspnea, CT on presentation "showed progressive diffuse infiltrated malignancy" -pulmonary/critical care consulted, recommend hospice placement -he is currently on nebs/steroids/abx/eliquis/HFNC. -still on high flow oxygen 9 L.  -transfer to med-surgery.  -stop IV antibiotics.    Start oral lasix.   [name] still considering hospice.   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 Significance uncertain  on CT orbit from 02/2018 He denies vision problem, no eye pain  Chronic Systolic Heart failure;  EF 25 --30 %  Continue with low dose coreg.  Start oral lasix.    RN Pressure Injury Documentation:    Malnutrition Type:  Nutrition Problem: Moderate Malnutrition Etiology: chronic illness, catabolic illness, cancer and cancer related treatments   Malnutrition Characteristics:  Signs/Symptoms: moderate fat depletion, moderate muscle depletion   Nutrition Interventions:  Interventions: Ensure Enlive (each supplement provides 350kcal and 20 grams of protein), MVI  Estimated body mass index is 17.5 kg/m as calculated from the following:   Height as of this encounter: 5\' 11"  (1.803 m).   Weight as of this encounter: 56.9 kg.   DVT prophylaxis: (Leliquis Code Status: DNR Family Communication: care discussed with patient.  Disposition Plan:  Awaiting decision from patient to be transfer to Residential Hospice.   Consultants:   CCM Oncology  Palliative   Procedures;  none   Antimicrobials:  Received 12 days of antibiotics.    Subjective: Still with SOB, cough  Objective: Vitals:   05/10/18 0832 05/10/18 1000 05/10/18 1106 05/10/18 1200  BP:  128/62  120/73  Pulse: 83 89  84  Resp: 16 (!) 25  18  Temp:    98 F (36.7 C)  TempSrc:    Oral  SpO2: (!) 89% 95%  91%  Weight:   56.9 kg   Height:        Intake/Output Summary (Last 24 hours) at 05/10/2018 1344 Last data filed at 05/10/2018 0900 Gross per 24  hour  Intake 560 ml  Output 1290 ml  Net -730 ml   Filed Weights   04/27/18 1212 04/27/18 1616 05/10/18 1106  Weight: 68.9 kg 67.8 kg 56.9 kg    Examination:  General exam: Thin appearing.  Respiratory system: Bilateral ronchus.  Cardiovascular system: S 1, S 2 RRR Gastrointestinal system: BS present, soft,,  nt Central nervous system: alert  Extremities: no edema Skin: no rashes.    Data Reviewed: I have personally reviewed following labs and imaging studies  CBC: No results for input(s): WBC, NEUTROABS, HGB, HCT, MCV, PLT in the last 168 hours. Basic Metabolic Panel: Recent Labs  Lab 05/09/18 1349  NA 139  K 4.0  CL 105  CO2 28  GLUCOSE 139*  BUN 16  CREATININE 1.04  CALCIUM 8.3*   GFR: Estimated Creatinine Clearance: 54.7 mL/min (by C-G formula based on SCr of 1.04 mg/dL). Liver Function Tests: No results for input(s): AST, ALT, ALKPHOS, BILITOT, PROT, ALBUMIN in the last 168 hours. No results for input(s): LIPASE, AMYLASE in the last 168 hours. No results for input(s): AMMONIA in the last 168 hours. Coagulation Profile: No results for input(s): INR, PROTIME in the last 168 hours. Cardiac Enzymes: No results for input(s): CKTOTAL, CKMB, CKMBINDEX, TROPONINI in the last 168 hours. BNP (last 3 results) No results for input(s): PROBNP in the last 8760 hours. HbA1C: No results for input(s): HGBA1C in the last 72 hours. CBG: No results for input(s): GLUCAP in the last 168 hours. Lipid Profile: No results for input(s): CHOL, HDL, LDLCALC, TRIG, CHOLHDL, LDLDIRECT in the last 72 hours. Thyroid Function Tests: No results for input(s): TSH, T4TOTAL, FREET4, T3FREE, THYROIDAB in the last 72 hours. Anemia Panel: No results for input(s): VITAMINB12, FOLATE, FERRITIN, TIBC, IRON, RETICCTPCT in the last 72 hours. Sepsis Labs: No results for input(s): PROCALCITON, LATICACIDVEN in the last 168 hours.  Recent Results (from the past 240 hour(s))  MRSA PCR Screening     Status: None   Collection Time: 05/01/18 12:34 PM  Result Value Ref Range Status   MRSA by PCR NEGATIVE NEGATIVE Final    Comment:        The GeneXpert MRSA Assay (FDA approved for NASAL specimens only), is one component of a comprehensive MRSA colonization surveillance program. It is not intended to diagnose  MRSA infection nor to guide or monitor treatment for MRSA infections. Performed at [facility], [place] [address] [place], [place] [address]          Radiology Studies: No results found.      Scheduled Meds: . apixaban  5 mg Oral BID  . carvedilol  3.125 mg Oral BID WC  . chlorhexidine  15 mL Mouth Rinse BID  . dexamethasone  12 mg Intravenous Q12H  . feeding supplement (ENSURE ENLIVE)  237 mL Oral TID BM  . furosemide  40 mg Oral Daily  . guaiFENesin  600 mg Oral BID  . ipratropium-albuterol  3 mL Nebulization TID  . mouth rinse  15 mL Mouth Rinse q12n4p  . multivitamin with minerals  1 tablet Oral Daily  . polyethylene glycol  17 g Oral Daily  . senna-docusate  1 tablet Oral QHS  . sodium chloride  2 spray Each Nare Q6H   Continuous Infusions: . sodium chloride Stopped (04/28/18 0005)     LOS: 13 days    Time spent: 35 minutes    Elmarie Shiley, MD [facility] Pager [phone]  If 7PM-7AM, please contact night-coverage  [email or web address] Password [email or web address] 05/10/2018, 1:44 PM

## 2018-05-10 NOTE — Progress Notes (Signed)
Nutrition Follow-up  DOCUMENTATION CODES:   Non-severe (moderate) malnutrition in context of chronic illness  INTERVENTION:  - Will order Ensure Enlive TID, each supplement provides 350 kcal and 20 grams of protein. - Will order daily multivitamin with minerals. - Continue to encourage PO intakes.  - Weigh patient today.    NUTRITION DIAGNOSIS:   Moderate Malnutrition related to chronic illness, catabolic illness, cancer and cancer related treatments as evidenced by moderate fat depletion, moderate muscle depletion.  GOAL:   Patient will meet greater than or equal to 90% of their needs  MONITOR:   PO intake, Supplement acceptance, Weight trends, Labs  REASON FOR ASSESSMENT:   LOS(day 13)  ASSESSMENT:   67 year old with past medical history significant for stage IV NSCL cancer with brain metastasis treated with palliative chemo and palliative whole-head radiation, CHF, and pulmonary embolism. He presented to the ED with worsening SOB, bed ridden due to severe dyspnea, and in the ED he was found to be hypoxic with oxygen saturation in the 80%. Patient was admitted with acute on chronic hypoxic respiratory failure. CT chest was concerning with progression of lung cancer. Critical care recommending hospice. Palliative care has been following. Patient had agreed to residential hospice but is now re-considering. Discharge disposition remains unclear.  Last weight was on 12/6 (date of admission). Weight at that time was 149 lb and weight on 11/13 was 151 lb. This indicates 2 lb weight loss (1.3% body weight) in 1 month; not significant for time frame. Weight on 10/24 was 155 lb. This indicates 6 lb weight loss (4% body weight) in 1.5 months; not significant for time frame.  Patient has been eating 25-75% of meals since admission. He reports eating most of breakfast which was scrambled eggs, bacon, coffee, and orange juice. He denies any pain with chewing or swallowing or difficulty  chewing or swallowing. He denies abdominal pain/pressure or nausea associated with PO intakes.   Will continue to monitor for discharge plan.    Medications reviewed; 40 mg oral Lasix/day, 1 packet Miralax/day, 1 tablet Senokot/day. Labs reviewed; Ca: 8.3 mg/dL.      NUTRITION - FOCUSED PHYSICAL EXAM:    Most Recent Value  Orbital Region  Mild depletion  Upper Arm Region  Moderate depletion  Thoracic and Lumbar Region  Unable to assess  Buccal Region  Moderate depletion  Temple Region  Moderate depletion  Clavicle Bone Region  Moderate depletion  Clavicle and Acromion Bone Region  Moderate depletion  Scapular Bone Region  Moderate depletion  Dorsal Hand  Mild depletion  Patellar Region  Moderate depletion  Anterior Thigh Region  Moderate depletion  Posterior Calf Region  Moderate depletion  Edema (RD Assessment)  None  Hair  Reviewed  Eyes  Reviewed  Mouth  Reviewed  Skin  Reviewed  Nails  Reviewed       Diet Order:   Diet Order            Diet general        Diet regular Room service appropriate? Yes; Fluid consistency: Thin  Diet effective now              EDUCATION NEEDS:   No education needs have been identified at this time  Skin:  Skin Assessment: Reviewed RN Assessment  Last BM:  12/18  Height:   Ht Readings from Last 1 Encounters:  04/27/18 5\' 11"  (1.803 m)    Weight:   Wt Readings from Last 1 Encounters:  04/27/18 67.8 kg  Ideal Body Weight:  78.18 kg  BMI:  Body mass index is 20.85 kg/m.  Estimated Nutritional Needs:   Kcal:  8453-6468 kcal  Protein:  110-125 grams  Fluid:  >/= 2 L/day     Jarome Matin, MS, RD, LDN, [facility] Inpatient Clinical Dietitian Pager # [phone] After hours/weekend pager # [phone]

## 2018-05-10 NOTE — Progress Notes (Signed)
CHART NOTE I saw Bradley Maldonado at the medical ICU today.  [name] not feeling well and his condition has deteriorated significantly in the last few weeks.  [name] currently on 9 L of oxygen.  He lost a lot of weight.  He recently completed whole brain irradiation.  I had a lengthy discussion with the patient today about his condition.  I strongly recommend for him to consider palliative care and hospice.  The patient mentioned that he would like to keep fighting to live longer but unfortunately he is too weak to tolerate any treatment at this point.  [name] expected to be discharged to a hospice facility.  I explained to the patient that if at any point he started feeling stronger and gaining his strength back I will be happy to reconsider him for treatment. Thank you for taking good care of Bradley Maldonado I will continue to follow up the patient with you and assist in his management as needed.

## 2018-05-11 NOTE — Progress Notes (Signed)
Pt woke up confused this evening. Stated he was on the phone with a family member and wanted the RN to discuss pt's status and the plan of care. When the RN got on the phone, he was on the phone with service response at [facility]. Reoriented pt and he stated that he understood that he was at the hospital and it was 2am and not 2pm. He was concerned that he had not eaten but after reorientation, he realized he cannot order breakfast until 6:30am. Pt stated he would like the RN and NT to remind him what time it is every time they enter his room so he doesn't get confused again.

## 2018-05-11 NOTE — Care Management Important Message (Signed)
Important Message  Patient Details  Name: Bradley Maldonado MRN: [id number] Date of Birth: 09-Jun-1950   Medicare Important Message Given:  Yes    Kerin Salen 05/11/2018, 10:37 AMImportant Message  Patient Details  Name: Bradley Maldonado MRN: [id number] Date of Birth: December 28, 1950   Medicare Important Message Given:  Yes    Kerin Salen 05/11/2018, 10:37 AM

## 2018-05-11 NOTE — Progress Notes (Signed)
Patient states that his sister will be in town tomorrow. Patient is still stating he wants to go to hospice on Monday.   Carolin Coy [place] Goodnight

## 2018-05-11 NOTE — Progress Notes (Signed)
PROGRESS NOTE    Bradley Maldonado  CXK:[id number] DOB: 11/20/1950 DOA: 04/27/2018 PCP: [name], NP-C    Brief Narrative: 67 year old with past medical history significant for stage IV non-small cell lung cancer, with brain metastasis treated with palliative palliative chemo and radiation, chronic systolic heart failure ejection fraction 25% and pulmonary embolism who presents with worsening shortness of breath, he has been bed ridden due to severe dyspnea, he was found to be hypoxic with oxygen saturation in the 80%. Patient was admitted with acute on chronic hypoxic respiratory failure.  He was treated with IV antibiotics, nebulizer treatment, he has received IV Lasix intermittently.  CT chest was concerning with progression of lung cancer. Patient was initially on BiPAP subsequently transition to nonrebreather mask and currently on high flow oxygen 8 L. Critical care recommending hospice.  Palliative care has been following and helping with patient care.  Some point patient agreed to residential hospice.       Assessment & Plan:   Active Problems:   Hypertension   Brain metastases (HCC)   Acute on chronic respiratory failure with hypoxia (HCC)   Lobar pneumonia (HCC)   Chronic systolic CHF (congestive heart failure) (HCC)   History of pulmonary embolism (HCC)   Shortness of breath   Palliative care by specialist   Malnutrition of moderate degree  Acute respiratory failure with hypoxemia due to advanced adenocarcinoma of the right lung (complete obliteration of the R lung) with brain mets / emphysema/copd and PE of left. ; -with brain mets / emphysema/copd and PE of left.  -he presented with dyspnea, CT on presentation "showed progressive diffuse infiltrated malignancy" -pulmonary/critical care consulted, recommend hospice placement -he is currently on nebs/steroids/abx/eliquis/HFNC. -still on high flow oxygen 9 L.  -stop IV antibiotics.  Continue with  oral lasix.     [name] still considering hospice. 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 Significance uncertain  on CT orbit from 02/2018 He denies vision problem, no eye pain  Chronic Systolic Heart failure;  EF 25 --30 %  Continue with low dose coreg.  Continue with lasix.    RN Pressure Injury Documentation:    Malnutrition Type:  Nutrition Problem: Moderate Malnutrition Etiology: chronic illness, catabolic illness, cancer and cancer related treatments   Malnutrition Characteristics:  Signs/Symptoms: moderate fat depletion, moderate muscle depletion   Nutrition Interventions:  Interventions: Ensure Enlive (each supplement provides 350kcal and 20 grams of protein), MVI  Estimated body mass index is 17.5 kg/m as calculated from the following:   Height as of this encounter: 5\' 11"  (1.803 m).   Weight as of this encounter: 56.9 kg.   DVT prophylaxis: (Leliquis Code Status: DNR Family Communication: care discussed with patient.  Disposition Plan:  Awaiting decision from patient to be transfer to Residential Hospice.   Consultants:   CCM Oncology  Palliative   Procedures;  none   Antimicrobials:  Received 12 days of antibiotics.    Subjective: Report SOB. Confuse.   Objective: Vitals:   05/11/18 0545 05/11/18 0800 05/11/18 1305 05/11/18 1414  BP: 113/62   108/66  Pulse: 89   83  Resp: 16   20  Temp: 98 F (36.7 C)   98.1 F (36.7 C)  TempSrc: Oral     SpO2: 92% (!) 88% (!) 89% 94%  Weight:      Height:        Intake/Output Summary (Last 24 hours) at 05/11/2018 1521 Last data filed at 05/11/2018  0550 Gross per 24 hour  Intake -  Output 1300 ml  Net -1300 ml   Filed Weights   04/27/18 1212 04/27/18 1616 05/10/18 1106  Weight: 68.9 kg 67.8 kg 56.9 kg    Examination:  General exam: cachetic.  Respiratory system:bilateral ronchus.  Cardiovascular system: S 1, S 2 RRR Gastrointestinal system: BS present, soft,  nt Central nervous system: Alert.  Extremities: No edema Skin: no rashes.    Data Reviewed: I have personally reviewed following labs and imaging studies  CBC: No results for input(s): WBC, NEUTROABS, HGB, HCT, MCV, PLT in the last 168 hours. Basic Metabolic Panel: Recent Labs  Lab 05/09/18 1349  NA 139  K 4.0  CL 105  CO2 28  GLUCOSE 139*  BUN 16  CREATININE 1.04  CALCIUM 8.3*   GFR: Estimated Creatinine Clearance: 54.7 mL/min (by C-G formula based on SCr of 1.04 mg/dL). Liver Function Tests: No results for input(s): AST, ALT, ALKPHOS, BILITOT, PROT, ALBUMIN in the last 168 hours. No results for input(s): LIPASE, AMYLASE in the last 168 hours. No results for input(s): AMMONIA in the last 168 hours. Coagulation Profile: No results for input(s): INR, PROTIME in the last 168 hours. Cardiac Enzymes: No results for input(s): CKTOTAL, CKMB, CKMBINDEX, TROPONINI in the last 168 hours. BNP (last 3 results) No results for input(s): PROBNP in the last 8760 hours. HbA1C: No results for input(s): HGBA1C in the last 72 hours. CBG: No results for input(s): GLUCAP in the last 168 hours. Lipid Profile: No results for input(s): CHOL, HDL, LDLCALC, TRIG, CHOLHDL, LDLDIRECT in the last 72 hours. Thyroid Function Tests: No results for input(s): TSH, T4TOTAL, FREET4, T3FREE, THYROIDAB in the last 72 hours. Anemia Panel: No results for input(s): VITAMINB12, FOLATE, FERRITIN, TIBC, IRON, RETICCTPCT in the last 72 hours. Sepsis Labs: No results for input(s): PROCALCITON, LATICACIDVEN in the last 168 hours.  No results found for this or any previous visit (from the past 240 hour(s)).       Radiology Studies: No results found.      Scheduled Meds: . apixaban  5 mg Oral BID  . carvedilol  3.125 mg Oral BID WC  . chlorhexidine  15 mL Mouth Rinse BID  . dexamethasone  12 mg Intravenous Q12H  . feeding supplement (ENSURE ENLIVE)  237 mL Oral TID BM  . furosemide  40 mg Oral  Daily  . guaiFENesin  600 mg Oral BID  . ipratropium-albuterol  3 mL Nebulization TID  . mouth rinse  15 mL Mouth Rinse q12n4p  . multivitamin with minerals  1 tablet Oral Daily  . polyethylene glycol  17 g Oral Daily  . senna-docusate  1 tablet Oral QHS  . sodium chloride  2 spray Each Nare Q6H   Continuous Infusions: . sodium chloride Stopped (04/28/18 0005)     LOS: 14 days    Time spent: 35 minutes    Elmarie Shiley, MD [facility] Pager [phone]  If 7PM-7AM, please contact night-coverage [email or web address] Password [email or web address] 05/11/2018, 3:21 PM

## 2018-05-11 NOTE — Progress Notes (Signed)
Daily Progress Note   Patient Name: Bradley Maldonado       Date: 05/11/2018 DOB: 1951-01-22  Age: 67 y.o. MRN#: [id number] Attending Physician: Elmarie Shiley, MD Primary Care Physician: [name], NP-C Admit Date: 04/27/2018  Reason for Consultation/Follow-up: Establishing goals of care  Subjective: Patient is awake, resting in bed I discussed with the patient regarding residential hospice.  Reports that he will "let you know when I am ready.  Maybe on Monday."  PPS 30%     See below   Length of Stay: 14  Current Medications: Scheduled Meds:  . apixaban  5 mg Oral BID  . carvedilol  3.125 mg Oral BID WC  . chlorhexidine  15 mL Mouth Rinse BID  . dexamethasone  12 mg Intravenous Q12H  . feeding supplement (ENSURE ENLIVE)  237 mL Oral TID BM  . furosemide  40 mg Oral Daily  . guaiFENesin  600 mg Oral BID  . ipratropium-albuterol  3 mL Nebulization TID  . mouth rinse  15 mL Mouth Rinse q12n4p  . multivitamin with minerals  1 tablet Oral Daily  . polyethylene glycol  17 g Oral Daily  . senna-docusate  1 tablet Oral QHS  . sodium chloride  2 spray Each Nare Q6H    Continuous Infusions: . sodium chloride Stopped (04/28/18 0005)    PRN Meds: sodium chloride, acetaminophen **OR** acetaminophen, ipratropium-albuterol, morphine injection, ondansetron **OR** ondansetron (ZOFRAN) IV, sodium chloride  Physical Exam         Gen: chronically ill appearing, temporal wasting  neck supple PULM: No air movement on the right, CTA L.    CV:   trace edema GI: BS+, soft, nontender  Psyche: normal mood and affect,   Appears more frail and weak  Vital Signs: BP 108/66 (BP Location: Left Arm)   Pulse 83   Temp 98.1 F (36.7 C)   Resp 20   Ht 5\' 11"  (1.803 m)   Wt 56.9 kg    SpO2 94%   BMI 17.50 kg/m  SpO2: SpO2: 94 % O2 Device: O2 Device: Nasal Cannula O2 Flow Rate: O2 Flow Rate (L/min): 10 L/min  Intake/output summary:   Intake/Output Summary (Last 24 hours) at 05/11/2018 1735 Last data filed at 05/11/2018 1713 Gross per 24 hour  Intake 840 ml  Output  3275 ml  Net -2435 ml   LBM: Last BM Date: 05/09/18 Baseline Weight: Weight: 68.9 kg Most recent weight: Weight: 56.9 kg       Palliative Assessment/Data: PPS 30%   Flowsheet Rows     Most Recent Value  Intake Tab  Referral Department  Hospitalist  Unit at Time of Referral  ICU  Palliative Care Primary Diagnosis  Cancer  Date Notified  04/28/18  Palliative Care Type  Return patient Palliative Care  Reason for referral  Clarify Goals of Care  Date of Admission  04/27/18  Date first seen by Palliative Care  04/28/18  # of days Palliative referral response time  0 Day(s)  # of days IP prior to Palliative referral  1  Clinical Assessment  Palliative Performance Scale Score  50%  Pain Max last 24 hours  0  Pain Min Last 24 hours  0  Dyspnea Max Last 24 Hours  8  Dyspnea Min Last 24 hours  3  Psychosocial & Spiritual Assessment  Palliative Care Outcomes  Patient/Family meeting held?  Yes  Who was at the meeting?  patient  Palliative Care Outcomes  Clarified goals of care      Patient Active Problem List   Diagnosis Date Noted  . Malnutrition of moderate degree 05/10/2018  . Shortness of breath   . Palliative care by specialist   . Acute on chronic respiratory failure with hypoxia ([place]) 04/28/2018  . Lobar pneumonia ([place]) 04/28/2018  . Chronic systolic CHF (congestive heart failure) ([place]) 04/28/2018  . History of pulmonary embolism ([place]) 04/28/2018  . Encounter for antineoplastic chemotherapy 04/04/2018  . Brain metastases ([place]) 03/14/2018  . Acute combined systolic and diastolic heart failure ([place]) 03/05/2018  . Recurrent right pleural effusion 03/01/2018  . Encounter for  antineoplastic immunotherapy 02/15/2017  . Adenocarcinoma of right lung, stage 4 ([place]) 01/24/2017  . Goals of care, counseling/discussion 01/24/2017  . Status post thoracentesis   . Chronic obstructive pulmonary disease ([place])   . Lung cancer ([place]) 12/20/2016  . Pleural effusion, malignant 12/17/2016  . Hypertension 12/17/2016  . History of right hip replacement 12/17/2016    Palliative Care Assessment & Plan   Patient Profile:    Assessment: Life limiting illness:  Acute respiratory failure with hypoxemia due to advanced adenocarcinoma of the right lung (complete obliteration of the R lung) and emphysema of left and PE of left.   History of brain mets s/p palliative chemo and radiation.  Chronic systolic heart failure with EF 25%  Recommendations/Plan: Continue to recommend residential hospice. Patient was approved but declined to transition at that time.  Reports "Maybe on Monday."    Continue  low dose PRN IV opioids for symptom management of pain/dyspnea. Monitor needs.       Code Status:   DNR DNI as of 05-03-18.      Code Status Orders  (From admission, onward)         Start     Ordered   04/27/18 1614  Full code  Continuous     04/27/18 1613        Code Status History    Date Active Date Inactive Code Status Order ID Comments User Context   03/01/2018 1559 03/06/2018 1510 Full Code [id number]  [name], MD Inpatient   12/17/2016 1707 12/21/2016 2036 Full Code [id number]  Shela Leff, MD ED       Prognosis:   guarded less than 2 weeks.   Discharge Planning:    transfer  to residential hospice if patient agrees.   Care plan was discussed with  Patient, bedside RN    Thank you for allowing the Palliative Medicine Team to assist in the care of this patient.   Total Time 20 Prolonged Time Billed  no       Greater than 50%  of this time was spent counseling and coordinating care related to the above assessment and plan.  Micheline Rough,  MD  Please contact Palliative Medicine Team phone at [phone] for questions and concerns.

## 2018-05-12 NOTE — Progress Notes (Signed)
PROGRESS NOTE    Bradley Maldonado  AYT:[id number] DOB: March 05, 1951 DOA: 04/27/2018 PCP: [name], NP-C    Brief Narrative: 67 year old with past medical history significant for stage IV non-small cell lung cancer, with brain metastasis treated with palliative palliative chemo and radiation, chronic systolic heart failure ejection fraction 25% and pulmonary embolism who presents with worsening shortness of breath, he has been bed ridden due to severe dyspnea, he was found to be hypoxic with oxygen saturation in the 80%. Patient was admitted with acute on chronic hypoxic respiratory failure.  He was treated with IV antibiotics, nebulizer treatment, he has received IV Lasix intermittently.  CT chest was concerning with progression of lung cancer. Patient was initially on BiPAP subsequently transition to nonrebreather mask and currently on high flow oxygen 8 L. Critical care recommending hospice.  Palliative care has been following and helping with patient care.  Some point patient agreed to residential hospice.    Assessment & Plan:   Active Problems:   Hypertension   Brain metastases (HCC)   Acute on chronic respiratory failure with hypoxia (HCC)   Lobar pneumonia (HCC)   Chronic systolic CHF (congestive heart failure) (HCC)   History of pulmonary embolism (HCC)   Shortness of breath   Palliative care by specialist   Malnutrition of moderate degree  Acute respiratory failure with hypoxemia due to advanced adenocarcinoma of the right lung (complete obliteration of the R lung) with brain mets / emphysema/copd and PE of left. ; -with brain mets / emphysema/copd and PE of left.  -he presented with dyspnea, CT on presentation "showed progressive diffuse infiltrated malignancy" -pulmonary/critical care consulted, recommend hospice placement -he is currently on nebs/steroids/abx/eliquis/HFNC. -still on high flow oxygen 9 L.  -stop IV antibiotics.  Continue with  oral lasix.   [name]  still considering hospice. Might go on Monday  Stable.   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 Significance uncertain  on CT orbit from 02/2018 He denies vision problem, no eye pain  Chronic Systolic Heart failure;  EF 25 --30 %  Continue with low dose coreg.  Continue with lasix.    RN Pressure Injury Documentation:    Malnutrition Type:  Nutrition Problem: Moderate Malnutrition Etiology: chronic illness, catabolic illness, cancer and cancer related treatments   Malnutrition Characteristics:  Signs/Symptoms: moderate fat depletion, moderate muscle depletion   Nutrition Interventions:  Interventions: Ensure Enlive (each supplement provides 350kcal and 20 grams of protein), MVI  Estimated body mass index is 17.5 kg/m as calculated from the following:   Height as of this encounter: 5\' 11"  (1.803 m).   Weight as of this encounter: 56.9 kg.   DVT prophylaxis: (Leliquis Code Status: DNR Family Communication: care discussed with patient.  Disposition Plan:  Awaiting decision from patient to be transfer to Residential Hospice.   Consultants:   CCM Oncology  Palliative   Procedures;  none   Antimicrobials:  Received 12 days of antibiotics.    Subjective: Alert, asking for his medications to be ready on Monday for discharge   Objective: Vitals:   05/12/18 0543 05/12/18 0928 05/12/18 1323 05/12/18 1426  BP: 128/87  111/73   Pulse: 79  76   Resp: 20  20   Temp: 98.1 F (36.7 C)  98.1 F (36.7 C)   TempSrc:   Oral   SpO2: 98% 93% 96% 90%  Weight:      Height:        Intake/Output Summary (Last 24 hours) at 05/12/2018 1444  Last data filed at 05/12/2018 1325 Gross per 24 hour  Intake 360 ml  Output 2650 ml  Net -2290 ml   Filed Weights   04/27/18 1212 04/27/18 1616 05/10/18 1106  Weight: 68.9 kg 67.8 kg 56.9 kg    Examination:  General exam: Cachetic  Respiratory system: Bilateral ronchus.  Cardiovascular system: S 1, S 2  RRR Gastrointestinal system: BS present, soft  Central nervous system: alert.  Extremities: No edema Skin: no rashes.    Data Reviewed: I have personally reviewed following labs and imaging studies  CBC: No results for input(s): WBC, NEUTROABS, HGB, HCT, MCV, PLT in the last 168 hours. Basic Metabolic Panel: Recent Labs  Lab 05/09/18 1349  NA 139  K 4.0  CL 105  CO2 28  GLUCOSE 139*  BUN 16  CREATININE 1.04  CALCIUM 8.3*   GFR: Estimated Creatinine Clearance: 54.7 mL/min (by C-G formula based on SCr of 1.04 mg/dL). Liver Function Tests: No results for input(s): AST, ALT, ALKPHOS, BILITOT, PROT, ALBUMIN in the last 168 hours. No results for input(s): LIPASE, AMYLASE in the last 168 hours. No results for input(s): AMMONIA in the last 168 hours. Coagulation Profile: No results for input(s): INR, PROTIME in the last 168 hours. Cardiac Enzymes: No results for input(s): CKTOTAL, CKMB, CKMBINDEX, TROPONINI in the last 168 hours. BNP (last 3 results) No results for input(s): PROBNP in the last 8760 hours. HbA1C: No results for input(s): HGBA1C in the last 72 hours. CBG: No results for input(s): GLUCAP in the last 168 hours. Lipid Profile: No results for input(s): CHOL, HDL, LDLCALC, TRIG, CHOLHDL, LDLDIRECT in the last 72 hours. Thyroid Function Tests: No results for input(s): TSH, T4TOTAL, FREET4, T3FREE, THYROIDAB in the last 72 hours. Anemia Panel: No results for input(s): VITAMINB12, FOLATE, FERRITIN, TIBC, IRON, RETICCTPCT in the last 72 hours. Sepsis Labs: No results for input(s): PROCALCITON, LATICACIDVEN in the last 168 hours.  No results found for this or any previous visit (from the past 240 hour(s)).       Radiology Studies: No results found.      Scheduled Meds: . apixaban  5 mg Oral BID  . carvedilol  3.125 mg Oral BID WC  . chlorhexidine  15 mL Mouth Rinse BID  . dexamethasone  12 mg Intravenous Q12H  . feeding supplement (ENSURE ENLIVE)  237  mL Oral TID BM  . furosemide  40 mg Oral Daily  . guaiFENesin  600 mg Oral BID  . ipratropium-albuterol  3 mL Nebulization TID  . mouth rinse  15 mL Mouth Rinse q12n4p  . multivitamin with minerals  1 tablet Oral Daily  . polyethylene glycol  17 g Oral Daily  . senna-docusate  1 tablet Oral QHS  . sodium chloride  2 spray Each Nare Q6H   Continuous Infusions: . sodium chloride Stopped (04/28/18 0005)     LOS: 15 days    Time spent: 35 minutes    Elmarie Shiley, MD [facility] Pager [phone]  If 7PM-7AM, please contact night-coverage [email or web address] Password [facility] 05/12/2018, 2:44 PM

## 2018-05-13 NOTE — Progress Notes (Signed)
PROGRESS NOTE    Bradley Maldonado  YNW:[id number] DOB: 08/27/1950 DOA: 04/27/2018 PCP: [name], NP-C    Brief Narrative: 67 year old with past medical history significant for stage IV non-small cell lung cancer, with brain metastasis treated with palliative palliative chemo and radiation, chronic systolic heart failure ejection fraction 25% and pulmonary embolism who presents with worsening shortness of breath, he has been bed ridden due to severe dyspnea, he was found to be hypoxic with oxygen saturation in the 80%. Patient was admitted with acute on chronic hypoxic respiratory failure.  He was treated with IV antibiotics, nebulizer treatment, he has received IV Lasix intermittently.  CT chest was concerning with progression of lung cancer. Patient was initially on BiPAP subsequently transition to nonrebreather mask and currently on high flow oxygen 8 L. Critical care recommending hospice.  Palliative care has been following and helping with patient care.  Some point patient agreed to residential hospice.    Assessment & Plan:   Active Problems:   Hypertension   Brain metastases (HCC)   Acute on chronic respiratory failure with hypoxia (HCC)   Lobar pneumonia (HCC)   Chronic systolic CHF (congestive heart failure) (HCC)   History of pulmonary embolism (HCC)   Shortness of breath   Palliative care by specialist   Malnutrition of moderate degree  Acute respiratory failure with hypoxemia due to advanced adenocarcinoma of the right lung (complete obliteration of the R lung) with brain mets / emphysema/copd and PE of left. ; -with brain mets / emphysema/copd and PE of left.  -he presented with dyspnea, CT on presentation "showed progressive diffuse infiltrated malignancy" -pulmonary/critical care consulted, recommend hospice placement -he is currently on nebs/steroids/abx/eliquis/HFNC. -still on high flow oxygen 9 L.  -stop IV antibiotics.  Continue with  oral lasix.     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 Significance uncertain  on CT orbit from 02/2018 He denies vision problem, no eye pain  Chronic Systolic Heart failure;  EF 25 --30 %  Continue with low dose coreg.  Continue with lasix.    RN Pressure Injury Documentation:    Malnutrition Type:  Nutrition Problem: Moderate Malnutrition Etiology: chronic illness, catabolic illness, cancer and cancer related treatments   Malnutrition Characteristics:  Signs/Symptoms: moderate fat depletion, moderate muscle depletion   Nutrition Interventions:  Interventions: Ensure Enlive (each supplement provides 350kcal and 20 grams of protein), MVI  Estimated body mass index is 17.5 kg/m as calculated from the following:   Height as of this encounter: 5\' 11"  (1.803 m).   Weight as of this encounter: 56.9 kg.   DVT prophylaxis: (Leliquis Code Status: DNR Family Communication: care discussed with patient.  Disposition Plan:  Awaiting decision from patient to be transfer to Residential Hospice.   Consultants:   CCM Oncology  Palliative   Procedures;  none   Antimicrobials:  Received 12 days of antibiotics.    Subjective: No new complaints   Objective: Vitals:   05/12/18 2039 05/13/18 0537 05/13/18 0844 05/13/18 1300  BP:  128/76  113/60  Pulse:  81  88  Resp:  18  18  Temp:  98.1 F (36.7 C)  98.4 F (36.9 C)  TempSrc:  Oral  Oral  SpO2: 94% 94% 90% 96%  Weight:      Height:        Intake/Output Summary (Last 24 hours) at 05/13/2018 1641 Last data filed at 05/13/2018 1335 Gross per 24 hour  Intake 480 ml  Output 2000 ml  Net -1520 ml   Filed Weights   04/27/18 1212 04/27/18 1616 05/10/18 1106  Weight: 68.9 kg 67.8 kg 56.9 kg    Examination:  General exam; Cachetic Respiratory system: Bilateral ronchus. .  Cardiovascular system: S 1, S 2 RRR Gastrointestinal system: BS present, soft, nt Central  nervous system:alert.    Data Reviewed: I have personally reviewed following labs and imaging studies  CBC: No results for input(s): WBC, NEUTROABS, HGB, HCT, MCV, PLT in the last 168 hours. Basic Metabolic Panel: Recent Labs  Lab 05/09/18 1349  NA 139  K 4.0  CL 105  CO2 28  GLUCOSE 139*  BUN 16  CREATININE 1.04  CALCIUM 8.3*   GFR: Estimated Creatinine Clearance: 54.7 mL/min (by C-G formula based on SCr of 1.04 mg/dL). Liver Function Tests: No results for input(s): AST, ALT, ALKPHOS, BILITOT, PROT, ALBUMIN in the last 168 hours. No results for input(s): LIPASE, AMYLASE in the last 168 hours. No results for input(s): AMMONIA in the last 168 hours. Coagulation Profile: No results for input(s): INR, PROTIME in the last 168 hours. Cardiac Enzymes: No results for input(s): CKTOTAL, CKMB, CKMBINDEX, TROPONINI in the last 168 hours. BNP (last 3 results) No results for input(s): PROBNP in the last 8760 hours. HbA1C: No results for input(s): HGBA1C in the last 72 hours. CBG: No results for input(s): GLUCAP in the last 168 hours. Lipid Profile: No results for input(s): CHOL, HDL, LDLCALC, TRIG, CHOLHDL, LDLDIRECT in the last 72 hours. Thyroid Function Tests: No results for input(s): TSH, T4TOTAL, FREET4, T3FREE, THYROIDAB in the last 72 hours. Anemia Panel: No results for input(s): VITAMINB12, FOLATE, FERRITIN, TIBC, IRON, RETICCTPCT in the last 72 hours. Sepsis Labs: No results for input(s): PROCALCITON, LATICACIDVEN in the last 168 hours.  No results found for this or any previous visit (from the past 240 hour(s)).       Radiology Studies: No results found.      Scheduled Meds: . apixaban  5 mg Oral BID  . carvedilol  3.125 mg Oral BID WC  . chlorhexidine  15 mL Mouth Rinse BID  . dexamethasone  12 mg Intravenous Q12H  . feeding supplement (ENSURE ENLIVE)  237 mL Oral TID BM  . furosemide  40 mg Oral Daily  . guaiFENesin  600 mg Oral BID  .  ipratropium-albuterol  3 mL Nebulization TID  . mouth rinse  15 mL Mouth Rinse q12n4p  . multivitamin with minerals  1 tablet Oral Daily  . polyethylene glycol  17 g Oral Daily  . senna-docusate  1 tablet Oral QHS  . sodium chloride  2 spray Each Nare Q6H   Continuous Infusions: . sodium chloride Stopped (04/28/18 0005)     LOS: 16 days    Time spent: 35 minutes    Elmarie Shiley, MD [facility] Pager [phone]  If 7PM-7AM, please contact night-coverage [email or web address] Password [facility] 05/13/2018, 4:41 PM

## 2018-05-14 MED ORDER — APIXABAN 5 MG PO TABS: 5.0000 mg | ORAL_TABLET | Freq: Two times a day (BID) | ORAL | 0 refills | Status: AC

## 2018-05-14 MED ORDER — IPRATROPIUM-ALBUTEROL 0.5-2.5 (3) MG/3ML IN SOLN: 3.0000 mL | RESPIRATORY_TRACT | 0 refills | Status: AC | PRN

## 2018-05-14 MED ORDER — FUROSEMIDE 40 MG PO TABS: 40.0000 mg | ORAL_TABLET | Freq: Every day | ORAL | 0 refills | Status: AC

## 2018-05-14 MED ORDER — GUAIFENESIN ER 600 MG PO TB12: 600.0000 mg | ORAL_TABLET | Freq: Two times a day (BID) | ORAL | 0 refills | Status: AC

## 2018-05-14 MED ORDER — MORPHINE SULFATE 20 MG/5ML PO SOLN: 2.5000 mg | ORAL | 0 refills | Status: AC | PRN

## 2018-05-14 MED ORDER — IPRATROPIUM-ALBUTEROL 0.5-2.5 (3) MG/3ML IN SOLN: 3.0000 mL | Freq: Three times a day (TID) | RESPIRATORY_TRACT | 0 refills | Status: AC

## 2018-05-14 MED ORDER — SALINE SPRAY 0.65 % NA SOLN: 1.0000 | NASAL | 0 refills | Status: AC | PRN

## 2018-05-14 MED ORDER — PREDNISONE 20 MG PO TABS: 20.0000 mg | ORAL_TABLET | Freq: Every day | ORAL | 0 refills | Status: AC

## 2018-05-14 MED ORDER — ONDANSETRON HCL 4 MG PO TABS: 4.0000 mg | ORAL_TABLET | Freq: Four times a day (QID) | ORAL | 0 refills | Status: AC | PRN

## 2018-05-14 NOTE — Progress Notes (Signed)
D/w Dr. Tyrell Antonio.  Mr. Bradley Maldonado is now agreeable to transfer to [facility].  I called and discussed with [facility] liaison who will follow up with him today.  Micheline Rough, MD [place] [facility] Team [phone]  NO CHARGE NOTE

## 2018-05-14 NOTE — Progress Notes (Signed)
LCSW notified [facility] that patient is agreeable.   LCSW awaiting bed availability.   LCSW will continue to follow.   Carolin Coy [place]stoy [place]

## 2018-05-14 NOTE — Care Management Note (Signed)
Case Management Note  Patient Details  Name: Bradley Maldonado MRN: [id number] Date of Birth: [birth year]  Subjective/Objective:                  To be discharged to [facility] today [id number]  Action/Plan: csw will handle  Expected Discharge Date:  05/14/18               Expected Discharge Plan:  [place]  In-House Referral:  Clinical Social Work  Discharge planning Services  CM Consult  Post Acute Care Choice:    Choice offered to:     DME Arranged:    DME Agency:     HH Arranged:    [place] Agency:     Status of Service:  Completed, signed off  If discussed at [employer], dates discussed:    Additional Comments:  [name], RN 05/14/2018, 10:40 AM

## 2018-05-14 NOTE — Progress Notes (Signed)
[facility] Liaison note.  Received request from [place], [place] for patient interest in [facility]. Chart reviewed and spoke with patient to acknowledge referral.  Eligibility confirmed, waiting on availability. Patient and CSW are aware [facility] liaison will follow up with CSW and family tomorrow or sooner if room becomes available. Please do not hesitate to call with questions.  Thank you,  Farrel Gordon, RN, [facility] Liaison  [place] are on AMION

## 2018-05-14 NOTE — Discharge Summary (Addendum)
Physician Discharge Summary  Bradley Maldonado:[id number] DOB: 1950-09-06 DOA: 04/27/2018  PCP: [name], NP-C  Admit date: 04/27/2018 Discharge date: 05/15/2018  Admitted From: Home  Disposition:  Residential hospice.   Recommendations for Outpatient Follow-up:  1. Comfort care.   Discharge Condition: guarded CODE STATUS: DNR Diet recommendation:Regular diet   Brief/Interim Summary: Brief Narrative: 67 year old with past medical history significant for stage IV non-small cell lung cancer, with brain metastasis treated with palliative palliative chemo and radiation, chronic systolic heart failure ejection fraction 25% and pulmonary embolism who presents with worsening shortness of breath, he has been bed ridden due to severe dyspnea, he was found to be hypoxic with oxygen saturation in the 80%. Patient was admitted with acute on chronic hypoxic respiratory failure.  He was treated with IV antibiotics, nebulizer treatment, he has received IV Lasix intermittently.  CT chest was concerning with progression of lung cancer. Patient was initially on BiPAP subsequently transition to nonrebreather mask and currently on high flow oxygen 8 L. Critical care recommending hospice.  Palliative care has been following and helping with patient care.  Some point patient agreed to residential hospice.   Addendum: Patient was supposed to be discharged to residential hospice on 05/14/2018 and was awaiting bed.  He will be discharged today.  Overall prognosis is very poor.   Assessment & Plan:   Active Problems:   Hypertension   Brain metastases (HCC)   Acute on chronic respiratory failure with hypoxia (HCC)   Lobar pneumonia (HCC)   Chronic systolic CHF (congestive heart failure) (HCC)   History of pulmonary embolism (HCC)   Shortness of breath   Palliative care by specialist   Malnutrition of moderate degree  Acute respiratory failure with hypoxemiadue to advanced adenocarcinoma of  the right lung (complete obliteration of the R lung) with brain mets/ emphysema/copd and PEof left. ; -with brain mets/ emphysema/copd and PEof left.  -he presented with dyspnea, CT on presentation "showed progressive diffuse infiltrated malignancy" -pulmonary/critical care consulted, recommend hospice placement -he is currently on nebs/steroids/abx/eliquis/HFNC. -still on high flow oxygen 9 L.  -stop IV antibiotics.  Continue with  oral lasix.  prednisone.  He agrees to go to residential hospice.   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 Significance uncertain  on CT orbit from 02/2018 He denies vision problem, no eye pain  Chronic Systolic Heart failure;  EF 25 --30 %  Continue with low dose coreg.  Continue with lasix.   Moderate malnutrition;  Related to malignancy.  Supplements/     Discharge Diagnoses:  Active Problems:   Hypertension   Brain metastases (HCC)   Acute on chronic respiratory failure with hypoxia (HCC)   Lobar pneumonia (HCC)   Chronic systolic CHF (congestive heart failure) (HCC)   History of pulmonary embolism (HCC)   Shortness of breath   Palliative care by specialist   Malnutrition of moderate degree    Discharge Instructions  Discharge Instructions    Diet - low sodium heart healthy   Complete by:  As directed    Diet general   Complete by:  As directed    Increase activity slowly   Complete by:  As directed    Increase activity slowly   Complete by:  As directed      Allergies as of 05/14/2018   No Known Allergies     Medication List    STOP taking these medications   dexamethasone 4 MG tablet Commonly known as:  DECADRON   diazepam  5 MG tablet Commonly known as:  VALIUM   fluconazole 100 MG tablet Commonly known as:  DIFLUCAN   folic acid 1 MG tablet Commonly known as:  FOLVITE   LORazepam 1 MG tablet Commonly known as:  ATIVAN   losartan 25 MG tablet Commonly known as:  COZAAR   prochlorperazine  10 MG tablet Commonly known as:  COMPAZINE     TAKE these medications   acetaminophen 650 MG CR tablet Commonly known as:  TYLENOL Take 650 mg by mouth every 8 (eight) hours as needed for pain.   apixaban 5 MG Tabs tablet Commonly known as:  ELIQUIS Take 1 tablet (5 mg total) by mouth 2 (two) times daily. What changed:    how much to take  how to take this  when to take this  additional instructions   carvedilol 3.125 MG tablet Commonly known as:  COREG Take 1 tablet (3.125 mg total) by mouth 2 (two) times daily with a meal.   furosemide 40 MG tablet Commonly known as:  LASIX Take 1 tablet (40 mg total) by mouth daily.   guaiFENesin 600 MG 12 hr tablet Commonly known as:  MUCINEX Take 1 tablet (600 mg total) by mouth 2 (two) times daily.   ipratropium-albuterol 0.5-2.5 (3) MG/3ML Soln Commonly known as:  DUONEB Take 3 mLs by nebulization every 2 (two) hours as needed.   ipratropium-albuterol 0.5-2.5 (3) MG/3ML Soln Commonly known as:  DUONEB Take 3 mLs by nebulization 3 (three) times daily.   morphine 20 MG/5ML solution Take 0.6 mLs (2.4 mg total) by mouth every 4 (four) hours as needed for pain.   ondansetron 4 MG tablet Commonly known as:  ZOFRAN Take 1 tablet (4 mg total) by mouth every 6 (six) hours as needed for nausea.   polyethylene glycol packet Commonly known as:  MIRALAX / GLYCOLAX Take 17 g by mouth daily.   predniSONE 20 MG tablet Commonly known as:  DELTASONE Take 1 tablet (20 mg total) by mouth daily.   senna-docusate 8.6-50 MG tablet Commonly known as:  Senokot-S Take 1 tablet by mouth at bedtime.   sodium chloride 0.65 % Soln nasal spray Commonly known as:  OCEAN Place 1 spray into both nostrils as needed for congestion.       No Known Allergies  Consultations:  Palliative  CCM  Oncology    Procedures/Studies: Ct Chest Wo Contrast  Result Date: 04/28/2018 CLINICAL DATA:  67 year old male with a history of stage IV  non-small cell lung cancer with brain metastases currently undergoing palliative chemo radiation therapy. Patient additionally has chronic systolic heart failure with an ejection fraction of 25%, recent pulmonary embolus and now worsening dyspnea and productive cough. EXAM: CT CHEST WITHOUT CONTRAST TECHNIQUE: Multidetector CT imaging of the chest was performed following the standard protocol without IV contrast. COMPARISON:  Most recent prior CT scan of the chest 03/02/2018 FINDINGS: Cardiovascular: Limited evaluation in the absence of intravenous contrast. Atherosclerotic calcifications visualized along the aorta. The heart is within normal limits for size. Calcifications present throughout the coronary arteries. No pericardial effusion. Mediastinum/Nodes: Limited evaluation in the absence of intravenous contrast. Diffuse mediastinal adenopathy is again noted. An index left paratracheal lymph node measures 1.3 cm in short axis. A juxta cardiac lymph node measures 1.2 cm in short axis. Unremarkable thoracic esophagus. Lungs/Pleura: Progressive patchy airspace opacity throughout the right upper lung. The right middle and right lower lobes remain completely opacified. Scattered metastatic pulmonary nodules again noted throughout the left lung.  Many of the nodules demonstrate slight interval enlargement consistent with disease progression. There is significant respiratory motion artifact. New small left pleural effusion and associated left lower lobe atelectasis. Persistent loculated right subpulmonic pleural effusion with a thick surrounding wall likely representing tumor. Upper Abdomen: No acute abnormality within the visualized upper abdomen. Small volume ascites. Musculoskeletal: Multifocal sclerotic osseous metastases. IMPRESSION: 1. Continued progression of stage IV metastatic lung cancer with multiple new blastic osseous metastases and worsening diffuse interstitial spread of disease throughout the right upper  lobe. The right middle and lower lobes are completely replaced by tumor and volume loss. There is minimal residual aerated lung on the right. Numerous metastatic pulmonary nodules on the left are slightly enlarged. 2. New small left pleural effusion and associated left lower lobe atelectasis. 3. Stable entrapped right sub pulmonic pleural effusion versus central necrosis within tumor. 4. Additional ancillary findings as above without significant interval change. Electronically Signed   By: Jacqulynn Cadet M.D.   On: 04/28/2018 10:39   Dg Chest Port 1 View  Result Date: 04/27/2018 CLINICAL DATA:  Shortness of breath.  Right-sided lung cancer. EXAM: PORTABLE CHEST 1 VIEW COMPARISON:  03/02/2018 FINDINGS: Metastatic nodules remain evident throughout the left lung without gross progression. Left lung is overall well aerated in there is no left-sided effusion. Opacification of the right hemithorax is slightly progressive, with pleural and parenchymal opacity related to advanced carcinoma. No acute bone finding. IMPRESSION: Slight progressive opacity of the right hemithorax with pleural and parenchymal density secondary to lung carcinoma. There is only minimal aeration in the right upper lobe. Metastatic nodules in the left lung appear similar to the previous study. Left lung is largely well aerated. Electronically Signed   By: Nelson Chimes M.D.   On: 04/27/2018 12:35    Subjective: He agrees to go to [facility] today.  Denies worsening SOB  Discharge Exam: Vitals:   05/13/18 2046 05/14/18 0510  BP: 117/68 112/72  Pulse: 87 88  Resp: 18 16  Temp: 98.1 F (36.7 C) 98.1 F (36.7 C)  SpO2: 93% 99%   Vitals:   05/13/18 0844 05/13/18 1300 05/13/18 2046 05/14/18 0510  BP:  113/60 117/68 112/72  Pulse:  88 87 88  Resp:  18 18 16   Temp:  98.4 F (36.9 C) 98.1 F (36.7 C) 98.1 F (36.7 C)  TempSrc:  Oral Oral Oral  SpO2: 90% 96% 93% 99%  Weight:      Height:        General:  cachetic Cardiovascular: RRR, S1/S2 +, no rubs, no gallops Respiratory: CTA bilaterally, no wheezing, no rhonchi Abdominal: Soft, NT, ND, bowel sounds + Extremities: no edema, no cyanosis    The results of significant diagnostics from this hospitalization (including imaging, microbiology, ancillary and laboratory) are listed below for reference.     Microbiology: No results found for this or any previous visit (from the past 240 hour(s)).   Labs: BNP (last 3 results) Recent Labs    03/01/18 0701 04/27/18 1305  BNP 33.9 62.9   Basic Metabolic Panel: Recent Labs  Lab 05/09/18 1349  NA 139  K 4.0  CL 105  CO2 28  GLUCOSE 139*  BUN 16  CREATININE 1.04  CALCIUM 8.3*   Liver Function Tests: No results for input(s): AST, ALT, ALKPHOS, BILITOT, PROT, ALBUMIN in the last 168 hours. No results for input(s): LIPASE, AMYLASE in the last 168 hours. No results for input(s): AMMONIA in the last 168 hours. CBC: No results  for input(s): WBC, NEUTROABS, HGB, HCT, MCV, PLT in the last 168 hours. Cardiac Enzymes: No results for input(s): CKTOTAL, CKMB, CKMBINDEX, TROPONINI in the last 168 hours. BNP: Invalid input(s): POCBNP CBG: No results for input(s): GLUCAP in the last 168 hours. D-Dimer No results for input(s): DDIMER in the last 72 hours. Hgb A1c No results for input(s): HGBA1C in the last 72 hours. Lipid Profile No results for input(s): CHOL, HDL, LDLCALC, TRIG, CHOLHDL, LDLDIRECT in the last 72 hours. Thyroid function studies No results for input(s): TSH, T4TOTAL, T3FREE, THYROIDAB in the last 72 hours.  Invalid input(s): FREET3 Anemia work up No results for input(s): VITAMINB12, FOLATE, FERRITIN, TIBC, IRON, RETICCTPCT in the last 72 hours. Urinalysis No results found for: COLORURINE, APPEARANCEUR, LABSPEC, [place], GLUCOSEU, HGBUR, BILIRUBINUR, KETONESUR, PROTEINUR, UROBILINOGEN, NITRITE, LEUKOCYTESUR Sepsis Labs Invalid input(s): PROCALCITONIN,  WBC,   LACTICIDVEN Microbiology No results found for this or any previous visit (from the past 240 hour(s)).   Time coordinating discharge: 35 minutes.  SIGNED:   Elmarie Shiley, MD  [facility] 05/14/2018, 8:19 AM Pager   If 7PM-7AM, please contact night-coverage [email or web address] Password [email or web address]

## 2018-05-14 NOTE — Care Management Important Message (Signed)
Important Message  Patient Details  Name: Bradley Maldonado MRN: [id number] Date of Birth: [birth year]   Medicare Important Message Given:  Yes    Kerin Salen 05/14/2018, 10:34 AMImportant Message  Patient Details  Name: Bradley Maldonado MRN: [id number] Date of Birth: 07/21/1950   Medicare Important Message Given:  Yes    Kerin Salen 05/14/2018, 10:34 AM

## 2018-05-15 ENCOUNTER — Ambulatory Visit: Payer: Medicare Other | Admitting: Internal Medicine

## 2018-05-15 ENCOUNTER — Other Ambulatory Visit: Payer: Medicare Other

## 2018-05-15 ENCOUNTER — Ambulatory Visit: Payer: Medicare Other

## 2018-05-15 NOTE — Progress Notes (Signed)
Pt admitting to [facility] for residential hospice care. Family at bedside, [facility] arranging [facility] transport. Report [id number]. Will send DC summary to [facility].  Sharren Bridge, MSW, LCSW Clinical Social Work 05/15/2018 [phone] coverage for [phone]

## 2018-05-15 NOTE — Progress Notes (Signed)
PMT progress note  Bradley Maldonado is resting in bed. [name] awake alert. He appears with mild shortness of breath as well as with generalized weakness. His PO intake is declining.   BP 108/63 (BP Location: Right Arm)   Pulse 79   Temp 98.1 F (36.7 C) (Oral)   Resp 18   Ht 5\' 11"  (1.803 m)   Wt 56.9 kg   SpO2 95%   BMI 17.50 kg/m  Labs and imaging noted Chart reviewed.   Discussed in detail with Bradley Maldonado with [facility], also discussed with bedside RN.   Awake alert Frail and weak Shallow diminished Has muscle wasting S 1 S 2  No edema Non focal   PPS 30%  Life limiting illness:  Acute respiratory failure with hypoxemia due to advanced adenocarcinoma of the right lung (complete obliteration of the R lung) and emphysema of left and PE of left.  History of brain mets s/p palliative chemo and radiation.  Chronic systolic heart failure with EF 25%.  Plan: Ongoing discussions with patient about residential hospice.  Appreciate [facility] support and follow up.  Anticipate D/C to residential hospice soon.   Prognosis few days to less than 2 weeks at this point.  Continue comfort measures.   25 minutes spent Bradley Chance MD [facility] palliative care team [phone]

## 2018-05-15 NOTE — Progress Notes (Signed)
Report called to [facility].

## 2018-05-15 NOTE — Progress Notes (Signed)
Patient ID: Bradley Maldonado, male   DOB: 24-Jun-1950, 67 y.o.   MRN: [id number] Patient is supposed to be discharged to residential hospice and is awaiting bed.  He will be discharged today.  Please refer to the discharge summary done by him Dr. Tyrell Antonio on 05/14/2018 for full details.  Overall prognosis is very poor.

## 2018-06-07 ENCOUNTER — Ambulatory Visit: Payer: Medicare Other

## 2018-06-07 ENCOUNTER — Ambulatory Visit: Payer: Medicare Other | Admitting: Oncology

## 2018-06-07 ENCOUNTER — Other Ambulatory Visit: Payer: Medicare Other

## 2018-06-23 DEATH — deceased

## 2018-06-28 ENCOUNTER — Other Ambulatory Visit: Payer: Medicare Other

## 2018-06-28 ENCOUNTER — Ambulatory Visit: Payer: Medicare Other | Admitting: Internal Medicine

## 2018-06-28 ENCOUNTER — Ambulatory Visit: Payer: Medicare Other

## 2018-10-29 IMAGING — CT CT CHEST W/ CM
2 of 5 series · 11 of 36 positions shown, 13 images · IV contrast (ISOVUE 300)
Comparison: CT scan 09/26/2017 and 07/26/2017

CLINICAL DATA: Metastatic adenocarcinoma of the lung. Immunotherapy
in progress.

EXAM:
CT CHEST, ABDOMEN, AND PELVIS WITH CONTRAST
TECHNIQUE: Multidetector CT imaging of the chest, abdomen and pelvis was
performed following the standard protocol during bolus
administration of intravenous contrast.
CONTRAST:  100mL A0RCFL-UQQ IOPAMIDOL (A0RCFL-UQQ) INJECTION 61%

[Series 2: cap with · axial · 0.79mm/px · z∈[-610,-86]mm · 8 of 136 slices shown, 10 images]
[im 16/136  mediastinal]
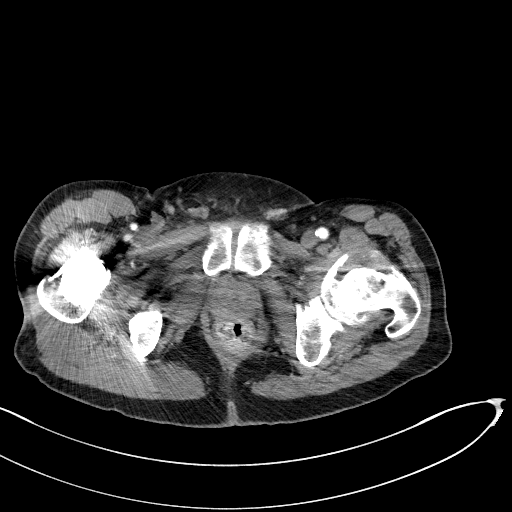
[im 16/136  lung]
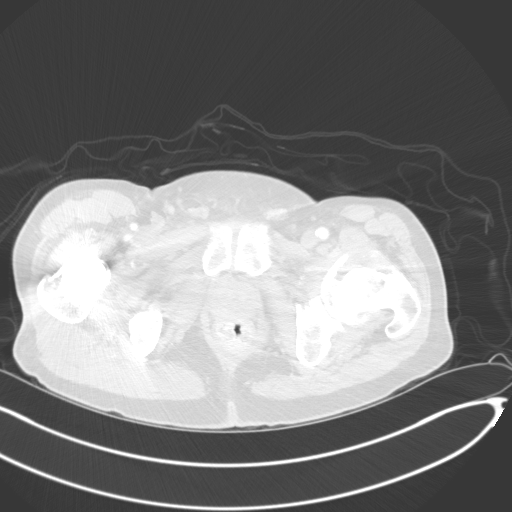
[im 31/136  lung]
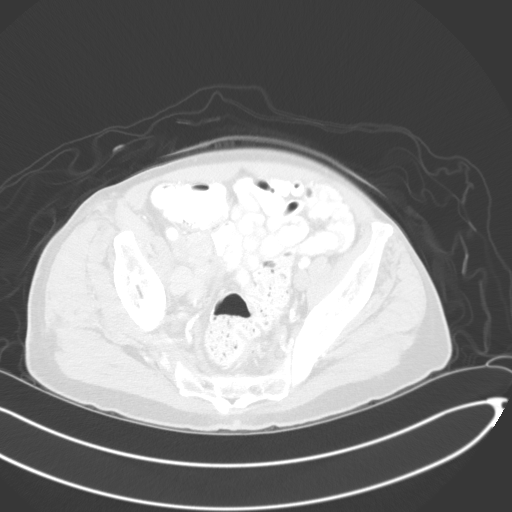
[im 46/136  lung]
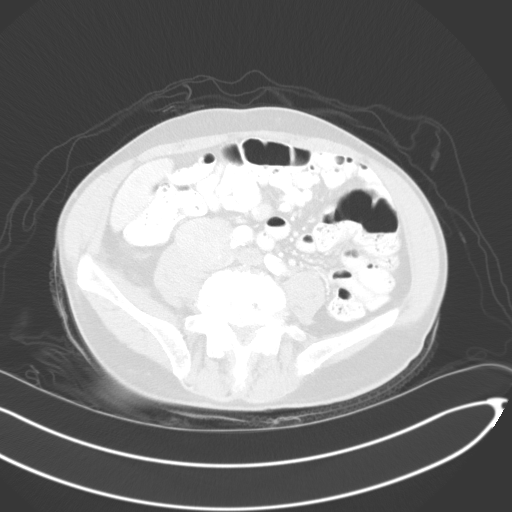
[im 61/136  lung]
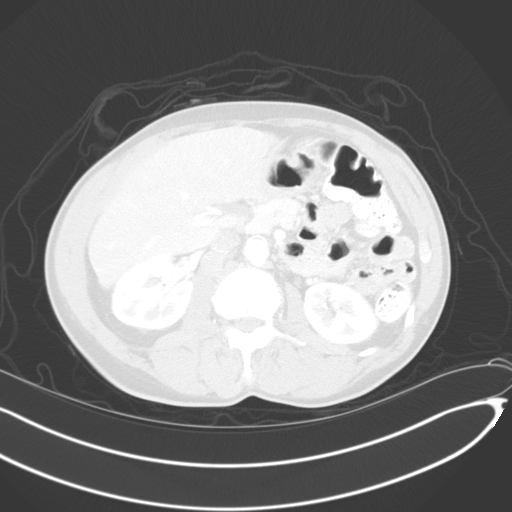
[im 76/136  mediastinal]
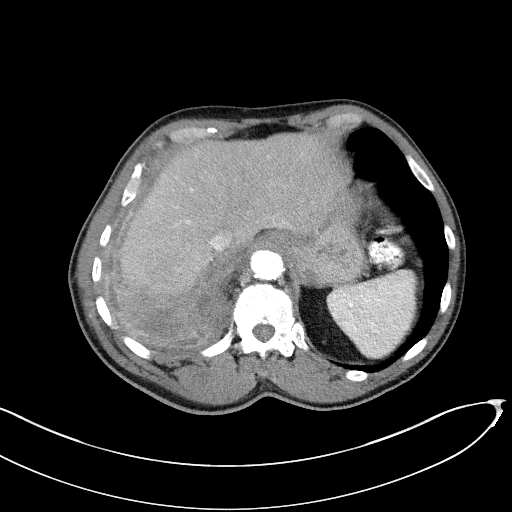
[im 76/136  lung]
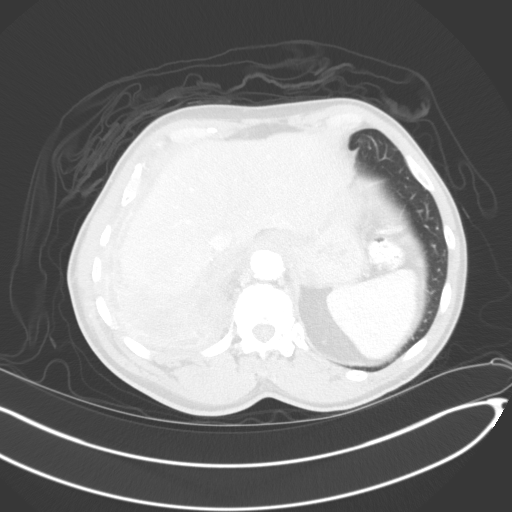
[im 91/136  lung]
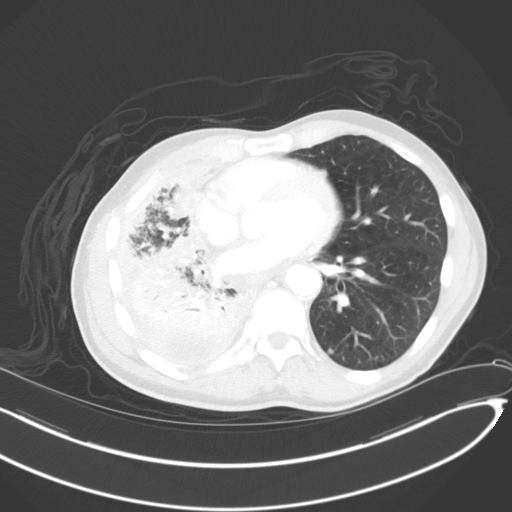
[im 106/136  lung]
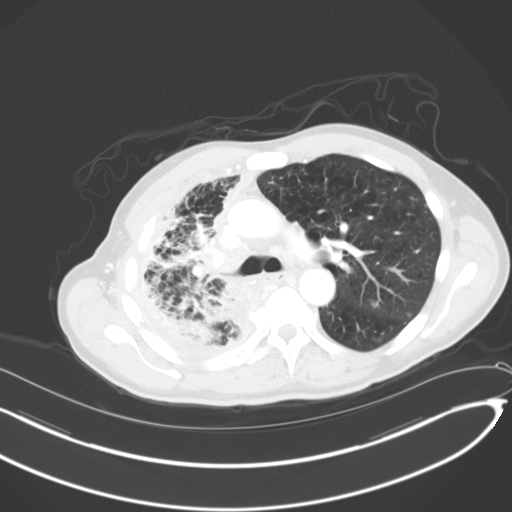
[im 121/136  lung]
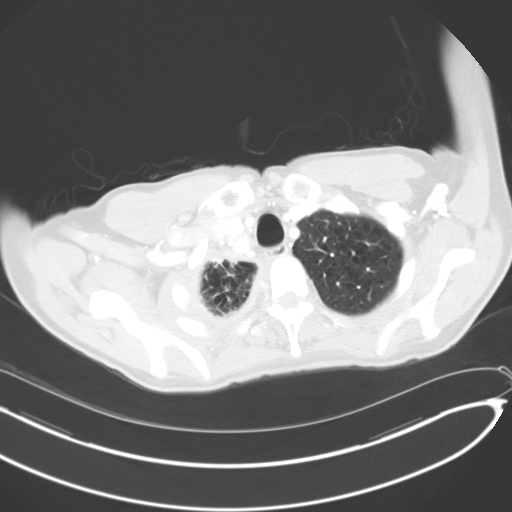

[Series 5: coronals · coronal · 0.81mm/px · 3 of 140 slices shown]
[im 28/140  lung]
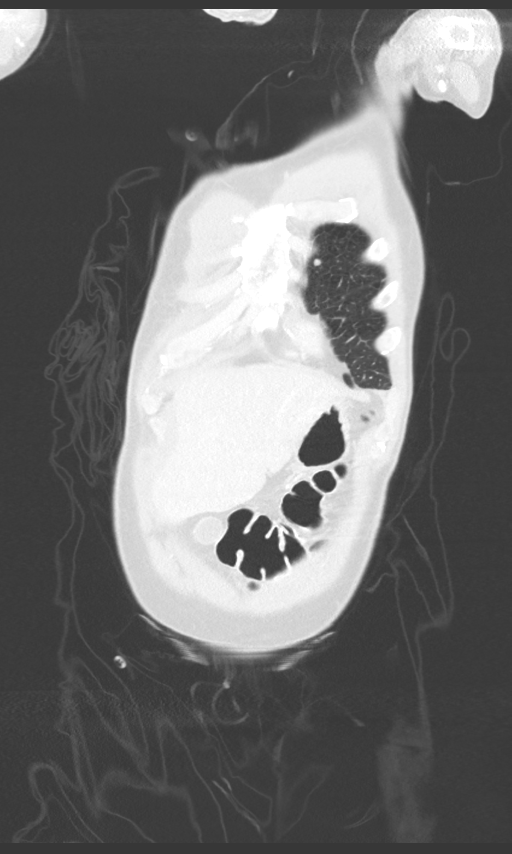
[im 56/140  lung]
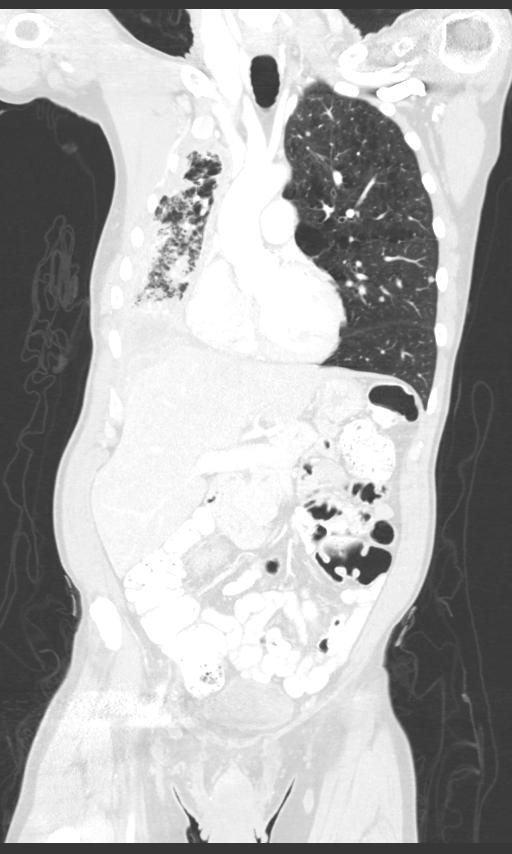
[im 84/140  lung]
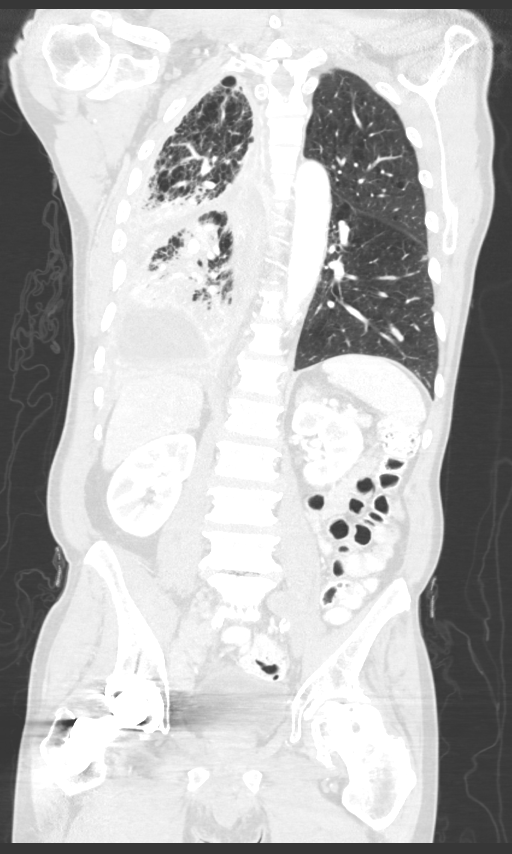

[11 of 36 positions shown; findings below may reference images not displayed]

FINDINGS: CT CHEST FINDINGS

Cardiovascular: The heart is normal in size. No pericardial
effusion. The aorta is normal in caliber. Stable atherosclerotic
calcifications. No dissection. The branch vessels are patent. Stable
three-vessel coronary artery calcifications.

Mediastinum/Nodes: Stable appearing right suprahilar tumor
surrounding a branch of the right upper lobe pulmonary artery. This
measures 27 mm on image number 30 and measured 24.5 mm on the prior
exam.

8.5 mm aorticopulmonary window lymph node on image number 30 is
unchanged.

Extensive low-attenuation necrotic appearing subcarinal adenopathy
measuring 17 mm on image number 37 and previously measuring 15 mm.

Lungs/Pleura: Extensive pleural tumor involving the right hemithorax
appears relatively stable. There is also extensive tumor in the lung
with large area of consolidation on image number 35 measuring 6.6 cm
which appears stable. Extensive interstitial spread of tumor
throughout the right lung. Persistent loculated pleural fluid
collection at the right lung base. Stable 8 mm epicardial lymph node
on image number 55.

Numerous metastatic pulmonary nodules are noted in the left lung.
Slight interval enlargement of the pulmonary nodules. Peripheral
left lower lobe nodule on image number 102 measures 6.5 mm and
previously measured 5 mm. 6 mm nodule in the left upper lobe
adjacent to the major fissure on image 77 previously measured 5 mm.
Several other pulmonary nodules are slightly larger.

Musculoskeletal: No definite osseous metastatic disease.

Stable enhancing metastatic right axillary lymph nodes. 11 mm node
on image number 20 is stable and 9.5 mm node on image number 27 is
stable. Lower right axillary lymph node on image number 36 measures
9.5 mm and previously measured 8.5 mm.

CT ABDOMEN PELVIS FINDINGS

Hepatobiliary: No hepatic lesions to suggest metastatic disease. The
gallbladder is normal. No intra or extrahepatic biliary dilatation.

Pancreas: No mass, inflammation or ductal dilatation.

Spleen: Normal size.  No focal lesions.

Adrenals/Urinary Tract: The adrenal glands are unremarkable and
stable. No worrisome renal lesions.

Stomach/Bowel: The stomach, duodenum, small bowel and colon are
grossly normal.

Vascular/Lymphatic: Right-sided retroperitoneal adenopathy is again
demonstrated. 16 mm node deep to the IVC on image number 81
previously measured 12.5 mm. Large nodal mass at the level of the
right iliac crest on image number 89 measures 5.0 x 4.0 cm and
previously measured 4.5 x 3.7 cm.

Bulky right-sided pelvic adenopathy appears relatively stable. Nodal
lesion on image number 104 measures 4.0 x 2.1 cm and is unchanged.

Nodal lesion on image number 111 measures 4.3 x 2.1 cm and is
stable. Small scattered inguinal nodes are stable.

Reproductive: The prostate gland and seminal vesicles are grossly
normal.

Other: No inguinal hernia or subcutaneous lesions.

Musculoskeletal: Artifact associated with the right hip hardware.
Severe left hip joint degenerative changes. No obvious metastatic
bone disease.
IMPRESSION: 1. Overall the right lung appears relatively stable. There is
extensive pleural and parenchymal tumor throughout the right
hemithorax with diffuse interstitial spread of tumor. Stable
loculated pleural fluid collection at the right lung base.
2. Overall stable right hilar and mediastinal lymphadenopathy.
3. Slight progression of pulmonary metastatic disease involving the
left lung.
4. Right-sided retroperitoneal adenopathy is slightly progressive
but the right-sided pelvic adenopathy appears stable.
5. Stable right axillary lymphadenopathy.
